# Patient Record
Sex: Male | Born: 1935 | Race: White | Hispanic: No
Health system: Midwestern US, Academic
[De-identification: ages and names within clinical notes are randomized; demographics above are authoritative.]

---

## 2017-05-05 MED ORDER — ASPIRIN 81 MG PO TBEC
81 mg | ORAL_TABLET | Freq: Every day | ORAL | 3 refills | Status: AC
Start: 2017-05-05 — End: ?

## 2017-05-10 MED ORDER — SODIUM CHLORIDE 0.9 % IV SOLP
INTRAVENOUS | 0 refills | Status: DC
Start: 2017-05-10 — End: 2017-05-10

## 2017-05-10 MED ORDER — ASPIRIN 325 MG PO TAB
325 mg | Freq: Once | ORAL | 0 refills | Status: DC
Start: 2017-05-10 — End: 2017-05-10

## 2017-05-10 MED ORDER — ALUMINUM-MAGNESIUM HYDROXIDE 200-200 MG/5 ML PO SUSP
30 mL | ORAL | 0 refills | Status: DC | PRN
Start: 2017-05-10 — End: 2017-05-10

## 2017-05-10 MED ORDER — TEMAZEPAM 15 MG PO CAP
15 mg | Freq: Every evening | ORAL | 0 refills | Status: DC | PRN
Start: 2017-05-10 — End: 2017-05-10

## 2017-05-10 MED ORDER — NITROGLYCERIN 0.4 MG SL SUBL
.4 mg | SUBLINGUAL | 0 refills | Status: DC | PRN
Start: 2017-05-10 — End: 2017-05-10

## 2017-05-10 MED ORDER — DIPHENHYDRAMINE HCL 25 MG PO CAP
25 mg | ORAL | 0 refills | Status: DC | PRN
Start: 2017-05-10 — End: 2017-05-10

## 2017-05-10 MED ORDER — ACETAMINOPHEN 325 MG PO TAB
650 mg | ORAL | 0 refills | Status: DC | PRN
Start: 2017-05-10 — End: 2017-05-10

## 2017-05-10 MED ORDER — DIPHENHYDRAMINE HCL 50 MG/ML IJ SOLN
25 mg | INTRAVENOUS | 0 refills | Status: DC | PRN
Start: 2017-05-10 — End: 2017-05-10

## 2017-05-10 MED ORDER — LIDOCAINE (PF) 10 MG/ML (1 %) IJ SOLN
.1-2 mL | INTRAMUSCULAR | 0 refills | Status: DC | PRN
Start: 2017-05-10 — End: 2017-05-10

## 2017-05-21 ENCOUNTER — Encounter: Admit: 2017-05-21 | Discharge: 2017-05-21 | Payer: MEDICARE

## 2017-05-21 DIAGNOSIS — I480 Paroxysmal atrial fibrillation: Principal | ICD-10-CM

## 2017-05-21 NOTE — Progress Notes
Pt confirmed dosing. Will check in 1-2 weeks.

## 2017-06-01 NOTE — Progress Notes
Gen Med Nurse Pre-visit Plan:          Patient hospitalized since last office visit: Yes.    POC testing or orders needed at office visit: No.    Health Maintenance Due   Topic Date Due    PERTUSSIS VACCINE  03/08/1945    TETANUS VACCINE  03/07/1953    SHINGLES VACCINE  03/07/1996        There are no diagnoses linked to this encounter.     Labs not done:  Lab Frequency Next Occurrence   REQUEST FOR CARDIOLOGY APPOINTMENT Once 08/29/2015   REQUEST FOR CARDIOLOGY APPOINTMENT Once 08/04/2016   DEVICE EVALUATION - PPM Once 11/04/2017   DEVICE EVALUATION - PPM Once 05/04/2017   HAND MIN 3 VIEWS RIGHT Once 12/09/2016   CBC AND DIFF Once 67/89/3810   BASIC METABOLIC PANEL Once 17/51/0258   LIPID PROFILE Once 02/09/2017   LIVER FUNCTION PANEL Once 02/09/2017   TSH WITH FREE T4 REFLEX Once 02/09/2017   KNEE 3 VIEWS LEFT Once 01/20/2017   2-D + DOPPLER ECHOCARDIOGRAM Once 05/05/2017   REQUEST FOR CARDIOLOGY APPOINTMENT Once 06/07/2017   PROTIME INR (PT) Once 05/17/2017   DEVICE EVALUATION - REMOTE PPM     PROTIME INR (PT)         RN called patient on 06/01/17 and LVM patient requesting fasting labs to be completed.     Notes to provider:

## 2017-06-03 ENCOUNTER — Encounter: Admit: 2017-06-03 | Discharge: 2017-06-03 | Payer: MEDICARE

## 2017-06-03 ENCOUNTER — Ambulatory Visit: Admit: 2017-06-03 | Discharge: 2017-06-04 | Payer: MEDICARE

## 2017-06-03 ENCOUNTER — Ambulatory Visit: Admit: 2017-06-03 | Discharge: 2017-06-03 | Payer: MEDICARE

## 2017-06-03 DIAGNOSIS — I2510 Atherosclerotic heart disease of native coronary artery without angina pectoris: ICD-10-CM

## 2017-06-03 DIAGNOSIS — B353 Tinea pedis: ICD-10-CM

## 2017-06-03 DIAGNOSIS — R001 Bradycardia, unspecified: ICD-10-CM

## 2017-06-03 DIAGNOSIS — I480 Paroxysmal atrial fibrillation: ICD-10-CM

## 2017-06-03 DIAGNOSIS — Z125 Encounter for screening for malignant neoplasm of prostate: ICD-10-CM

## 2017-06-03 DIAGNOSIS — H9190 Unspecified hearing loss, unspecified ear: ICD-10-CM

## 2017-06-03 DIAGNOSIS — I10 Essential (primary) hypertension: ICD-10-CM

## 2017-06-03 DIAGNOSIS — N401 Enlarged prostate with lower urinary tract symptoms: ICD-10-CM

## 2017-06-03 DIAGNOSIS — M1990 Unspecified osteoarthritis, unspecified site: ICD-10-CM

## 2017-06-03 DIAGNOSIS — Z823 Family history of stroke: ICD-10-CM

## 2017-06-03 DIAGNOSIS — B078 Other viral warts: ICD-10-CM

## 2017-06-03 DIAGNOSIS — I4891 Unspecified atrial fibrillation: ICD-10-CM

## 2017-06-03 DIAGNOSIS — Z7901 Long term (current) use of anticoagulants: ICD-10-CM

## 2017-06-03 DIAGNOSIS — L570 Actinic keratosis: ICD-10-CM

## 2017-06-03 DIAGNOSIS — I495 Sick sinus syndrome: ICD-10-CM

## 2017-06-03 DIAGNOSIS — L821 Other seborrheic keratosis: ICD-10-CM

## 2017-06-03 DIAGNOSIS — K219 Gastro-esophageal reflux disease without esophagitis: ICD-10-CM

## 2017-06-03 DIAGNOSIS — M25569 Pain in unspecified knee: ICD-10-CM

## 2017-06-03 DIAGNOSIS — J302 Other seasonal allergic rhinitis: ICD-10-CM

## 2017-06-03 DIAGNOSIS — D489 Neoplasm of uncertain behavior, unspecified: ICD-10-CM

## 2017-06-03 DIAGNOSIS — L304 Erythema intertrigo: ICD-10-CM

## 2017-06-03 DIAGNOSIS — E039 Hypothyroidism, unspecified: ICD-10-CM

## 2017-06-03 DIAGNOSIS — F5221 Male erectile disorder: ICD-10-CM

## 2017-06-03 DIAGNOSIS — R5383 Other fatigue: ICD-10-CM

## 2017-06-03 DIAGNOSIS — E782 Mixed hyperlipidemia: ICD-10-CM

## 2017-06-03 DIAGNOSIS — R42 Dizziness and giddiness: ICD-10-CM

## 2017-06-03 DIAGNOSIS — L578 Other skin changes due to chronic exposure to nonionizing radiation: ICD-10-CM

## 2017-06-03 DIAGNOSIS — Z950 Presence of cardiac pacemaker: ICD-10-CM

## 2017-06-03 DIAGNOSIS — E785 Hyperlipidemia, unspecified: ICD-10-CM

## 2017-06-03 DIAGNOSIS — B351 Tinea unguium: ICD-10-CM

## 2017-06-03 DIAGNOSIS — R51 Headache: ICD-10-CM

## 2017-06-03 LAB — LIVER FUNCTION PANEL
Lab: 0.3 mg/dL — ABNORMAL HIGH (ref <0.4)
Lab: 1.3 mg/dL — ABNORMAL HIGH (ref >40)
Lab: 21 U/L (ref 7–40)
Lab: 26 U/L — ABNORMAL LOW (ref >60)
Lab: 47 U/L (ref 25–110)
Lab: 7 g/dL (ref >60)

## 2017-06-03 LAB — PROSTATIC SPECIFIC ANTIGEN-PSA: Lab: 3.8 ng/mL (ref <6.01)

## 2017-06-03 LAB — CBC AND DIFF
Lab: 0 % (ref 0–2)
Lab: 0 10*3/uL (ref 0–0.20)
Lab: 0.2 10*3/uL (ref 0–0.45)
Lab: 0.7 10*3/uL (ref 0–0.80)
Lab: 1.3 10*3/uL (ref 1.0–4.8)
Lab: 3 % (ref 0–5)
Lab: 4.8 M/UL — ABNORMAL HIGH (ref 4.4–5.5)
Lab: 5.5 10*3/uL (ref 1.8–7.0)
Lab: 7.8 10*3/uL (ref 4.5–11.0)

## 2017-06-03 LAB — PROTIME INR (PT): Lab: 1.8 mg/dL — ABNORMAL HIGH (ref 0.8–1.2)

## 2017-06-03 LAB — BASIC METABOLIC PANEL
Lab: 137 MMOL/L (ref 137–147)
Lab: 4.3 MMOL/L (ref 3.5–5.1)

## 2017-06-03 LAB — TSH WITH FREE T4 REFLEX: Lab: 1.2 uU/mL (ref 0.35–5.00)

## 2017-06-03 LAB — LIPID PROFILE
Lab: 139 mg/dL — ABNORMAL HIGH (ref <200)
Lab: 94 mg/dL (ref <150)

## 2017-06-03 MED ORDER — TAMSULOSIN 0.4 MG PO CP24
.4 mg | ORAL_CAPSULE | Freq: Every day | ORAL | 3 refills | 90.00000 days | Status: AC
Start: 2017-06-03 — End: 2018-01-04

## 2017-06-03 MED FILL — SILDENAFIL 100 MG PO TAB: 100 mg | ORAL | 10 days supply | Qty: 10 | Fill #6 | Status: CP

## 2017-06-03 NOTE — Progress Notes
Subjective:             Jonathan Humphrey is a 83 y.o. male.    Hypertension   This is a chronic problem. The problem is controlled. Pertinent negatives include no blurred vision, chest pain, headaches, neck pain, orthopnea, palpitations or shortness of breath.     Jonathan Humphrey is 83 y.o. patient who presents to clinic for follow up. He was last seen 11/2016    He has hypertension and hyperlipidemia and he takes his medications daily. He follows with cardiology for atrial fibrillation and he is on coumadin. He is doing well. No chest pain    He has no chest pain or shortness of breath. No smoking. He drinks alcohol daily.     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     He saw cardiology 08/13/16 and had a normal stress test 08/14/16.     He had labs drawn today 06/03/17    He complains of frequent urination.     He saw ortho 01/2017 for trigger finger    He saw cardiology 05/05/17 and he was scheduled for cardiac cath which he did. Patient was taken to the cardiac catheterization lab on 05/10/17 where coronary angiography revealed mild coronary plaquing.     Past Medical History:   Diagnosis Date   ??? AK (actinic keratosis)     scalp   ??? Arthritis    ??? Atrial fibrillation (HCC) 01/16/2009   ??? Chronic anticoagulation 01/16/2009   ??? Coronary atherosclerosis 09/01/2006    Coronary artery disease   ??? Cyst     right upper back   ??? Dizziness 01/19/2007   ??? 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 Family history of cerebrovascular accident (CVA) 01/19/2007   ??? Fatigue 07/21/2007   ??? Generalized headaches    ??? GERD (gastroesophageal reflux disease) 12/30/2006   ??? Hearing loss 01/19/2007   ??? Hyperlipidemia 09/01/2006   ??? Hypertension, essential 09/01/2006   ??? Hypothyroidism 11/30/2007   ??? Intertrigo    ??? Knee pain 12/30/2006   ??? Neoplasm of uncertain behavior    ??? Onychomycosis    ??? Photoaging of skin    ??? Seasonal allergic reaction ??? Sinus bradycardia    ??? SK (seborrheic keratosis)     face, scalp, right ear   ??? SSS (sick sinus syndrome) (HCC)    ??? Tinea pedis    ??? Verruca vulgaris      Past Surgical History:   Procedure Laterality Date   ??? LEFT HEART CATHETERIZATION  05/1994    no stents   ??? CORONARY ANGIOPLASTY  05/1994    [facility], no stents   ??? SKIN BIOPSY  09/24/2006    shave biopsy   ??? CARDIOVERSION  03/11/2009   ??? 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 KNEE REPLACEMENT Right 10/09/14   ??? PR RPR UMBILICAL HRNA 5 YRS/> REDUCIBLE N/A 01/13/2016    REPAIR HERNIA UMBILICAL performed by Simonne Martinet, MD at [facility] MAIN OR/PERIOP   ??? PR RPR 1ST INGUN HRNA AGE 89 YRS/> REDUCIBLE Bilateral 01/13/2016    REPAIR HERNIA INGUINAL performed by Simonne Martinet, MD at [facility] MAIN OR/PERIOP     family history includes Cancer in his father; High Cholesterol in his mother; Hypertension in his mother; Stroke in his father.    Social History     Social History   ???  Marital status: Married     Spouse name: N/A   ??? Number of children: N/A   ??? Years of education: N/A     Occupational History   ??? Not on file.     Social History Main Topics   ??? Smoking status: Never Smoker   ??? Smokeless tobacco: Never Used   ??? Alcohol use Yes      Comment: 2-3 ounces daily   ??? Drug use: No   ??? Sexual activity: Not on file     Other Topics Concern   ??? Not on file     Social History Narrative   ??? No narrative on file       Social History   Substance Use Topics   ??? Smoking status: Never Smoker   ??? Smokeless tobacco: Never Used   ??? Alcohol use Yes      Comment: 2-3 ounces daily        Review of Systems   Constitutional: Negative.  Negative for appetite change, chills, diaphoresis, fatigue, fever and unexpected weight change.   HENT: Negative.  Negative for sneezing.    Eyes: Negative.  Negative for blurred vision, pain and itching.   Respiratory: Negative.  Negative for cough and shortness of breath.    Cardiovascular: Negative.  Negative for chest pain, palpitations and orthopnea.   Gastrointestinal: Negative.  Negative for abdominal distention, abdominal pain, blood in stool, constipation, diarrhea, nausea and vomiting.   Genitourinary: Negative.  Negative for flank pain, frequency and hematuria.   Musculoskeletal: Negative.  Negative for back pain, gait problem, neck pain and neck stiffness.   Skin: Negative.  Negative for pallor and rash.   Neurological: Negative.  Negative for seizures, facial asymmetry, numbness and headaches.   Psychiatric/Behavioral: Negative.  Negative for confusion.   All other systems reviewed and are negative.    Objective:         ??? aspirin EC 81 mg tablet Take 1 tablet by mouth daily. Take with food.   ??? atorvastatin (LIPITOR) 40 mg tablet Take 1 Tab by mouth at bedtime daily.   ??? cholecalciferol (VITAMIN D-3) 1,000 units tablet Take 1,500 Units by mouth daily.   ??? enalapril (VASOTEC) 20 mg tablet Take 1 Tab by mouth twice daily.   ??? ezetimibe (ZETIA) 10 mg tablet Take 1 Tab by mouth daily.   ??? ketoconazole (NIZORAL) 2 % topical cream Apply  to affected area twice daily. to scaly areas on forehead and nose   ??? levothyroxine (SYNTHROID) 50 mcg tablet Take 1 Tab by mouth daily.   ??? OMEGA-3 FATTY ACIDS/FISH OIL (FISH OIL-OMEGA-3 FATTY ACIDS PO) Take  by mouth daily. 2 tsp daily     ??? omeprazole DR(+) (PRILOSEC) 20 mg capsule Take 1 Cap by mouth daily.   ??? sildenafil(+) (VIAGRA) 100 mg tablet Take 1 Tab by mouth as Needed for Erectile dysfunction.   ??? tamsulosin (FLOMAX) 0.4 mg capsule Take 1 capsule by mouth daily. Do not crush, chew or open capsules. Take 30 minutes following the same meal each day.   ??? warfarin (COUMADIN) 4 mg tablet Take one daily or as instructed by Dr. Vivianne Spence (Patient taking differently: Take 4 mg by mouth as directed. 2 mg on MF and 4 mg rest of the week.)     Vitals:    06/03/17 1037   BP: (P) 117/71   Pulse: (P) 82   Resp: (P) 16   Temp: (P) 36.6 ???C (97.9 ???F)   TempSrc: (P)  Oral   Weight: (P) 93.8 kg (206 lb 12.8 oz) Height: (P) 172.7 cm (67.99)     Body mass index is 31.45 kg/m??? (pended).     Physical Exam   Constitutional: He is oriented to person, place, and time. He appears well-developed and well-nourished. No distress.   HENT:   Head: Normocephalic and atraumatic.   Mouth/Throat: No oropharyngeal exudate.   Eyes: Conjunctivae and EOM are normal. Pupils are equal, round, and reactive to light. Right eye exhibits no discharge. Left eye exhibits no discharge.   Neck: Normal range of motion. Neck supple. No JVD present. No tracheal deviation present.   Cardiovascular: Normal rate, regular rhythm and normal heart sounds.  Exam reveals no friction rub.    No murmur heard.  Pulmonary/Chest: Effort normal and breath sounds normal. No respiratory distress. He has no rales.       Abdominal: Soft. There is no tenderness. There is no rebound and no guarding.   Musculoskeletal: Normal range of motion.   Neurological: He is alert and oriented to person, place, and time. No cranial nerve deficit.   Skin: Skin is warm and dry. No rash noted. He is not diaphoretic. No pallor.   Psychiatric: He has a normal mood and affect. His behavior is normal. Judgment and thought content normal.   Nursing note and vitals reviewed.        Assessment and Plan:    He lives 120 [place] away from [place]:    BPH symptoms:  Will start Flomax  Will add PSA     He had bilateral inguinal and umbilical hernia repair 01/13/16: doing well. rewsolved  No complaints    Rt third finger with trigger finger:  He saw ortho clinic    CAD: s/p angioplasty in 1995. Follows with cardiology closely. Recent Thallium stress test 11/2011 was unremarkable. He had another stress test 10/15/15. 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 Continue risk factor modification. Continue Aspirin & Coumadin. Stable. Cardiology is adjusting his Coumadin dose.  He saw cardiology 08/13/16 and had a normal stress test 08/14/16. He saw cardiology 05/05/17 and he was scheduled for cardiac cath which he did. Patient was taken to the cardiac catheterization lab on 05/10/17 where coronary angiography revealed mild coronary plaquing.   Basic Metabolic Profile    Lab Results   Component Value Date/Time    NA 137 06/03/2017 07:51 AM    K 4.3 06/03/2017 07:51 AM    CA 9.8 06/03/2017 07:51 AM    CL 105 06/03/2017 07:51 AM    CO2 27 06/03/2017 07:51 AM    GAP 5 06/03/2017 07:51 AM    Lab Results   Component Value Date/Time    BUN 15 06/03/2017 07:51 AM    CR 0.89 06/03/2017 07:51 AM    GLU 101 (H) 06/03/2017 07:51 AM    GLU 94 01/06/2006 11:31 AM        Atrial fibrillation: intermittent. He has a history of angioplasty going back to 1995. He has had atrial arrhythmias. He has been in atrial fibrillation in the past. He failed a cardioversion back in 2010, but then spontaneously converted in 2011. He has underlying first-degree AV block and I think has underlying sinus node dysfunction. 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 The valve was left intact. He did not need any bypass surgery. He did  have some moderate nonobstructive disease. Stable. 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 I reviewed labs  He saw cardiology 08/13/16 and had a normal stress test 08/14/16.   He saw cardiology 05/05/17 and he was scheduled for cardiac cath which he did. Patient was taken to the cardiac catheterization lab on 05/10/17 where coronary angiography revealed mild coronary plaquing.   Cardiology adjusts his INR and Coumadin    Hypertension:controlled, continue meds, stable  Hypertension Management:  Medication compliance:  compliant most of the time,   Treatment goal: Systolic blood pressure 140 or <, diastolic BP 90 or <.  Outside blood pressures being performed - No  BP Readings from Last 3 Encounters:   06/03/17 (P) 117/71   05/10/17 125/75 05/05/17 128/72     He denies significant light-headedness.  Imp: Hypertension controlled   Plan:   Discussed hypertension and reviewed goals.  Are barriers to achieving goals present? No  Patient ready to comply? Yes  Educational resources identified? Yes - Handouts     Hyperlipidemia: stable on Lipitor 40 and reassess. LDL is at goal at 76.    Hyperlipidemia Management  LDL goal < 70.   Diet compliance:   compliant all of the time,   Medication compliance:  compliant all of the time,   Side effects to medications? No  Lab Results   Component Value Date    CHOL 139 06/03/2017    TRIG 94 06/03/2017    HDL 53 06/03/2017    LDL 74 06/03/2017    VLDL 19 06/03/2017    NONHDLCHOL 86 06/03/2017    CHOLHDLC 4.0 12/25/2011   Imp: Hyperlipidemia at goal  Plan:  Discussed labs and reviewed goals for LDL, HDL, triglycerides.   Discussed exercise management and diet with emphasis on vegetables, fruit and lean meat.  Are barriers to achieving goals present? No  Patient ready to comply? Yes  Educational resources identified? Yes - Handouts     GERD:  Stable on PPI    Erectile dysfunction:  Stable on Viagra    Hypothyroidism:  Continue Thyroid medications. Stable.   TSH   Lab Results   Component Value Date/Time    TSH 1.280 06/03/2017 07:51 AM        Hearing deficit:  He is following with audiology. 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 Stable.     Skin lesions:  He saw Derm 04/2014 , 06/04/2015 at [facility]    Return to clinic in 4-6 weeks to reassess BPH and response to therapy Total time 40 minutes.  Estimated counseling time 25 minutes.  Counseled patient regarding  BPH, A. Fib, anticoagulation, HTN. I went over labs and care plan  I discussed advance care planning ( see separate note)      Orders Placed This Encounter   ??? PSA SCREEN today   ??? tamsulosin (FLOMAX) 0.4 mg capsule     Encounter Medications   Medications   ??? tamsulosin (FLOMAX) 0.4 mg capsule  Sig: Take 1 capsule by mouth daily. Do not crush, chew or open capsules. Take 30 minutes following the same meal each day.     Dispense:  90 capsule     Refill:  3     Future Appointments  Date Time Provider Department Center   06/09/2017 10:30 AM Bayard Beaver, MD [facility]   06/09/2017 2:00 PM [facility] [place]   06/09/2017 3:30 PM Genton, Guido Sander, MD [facility] [place]     There are no Patient Instructions on file for this visit.    Orders Placed This Encounter   ??? PSA SCREEN today   ??? tamsulosin (FLOMAX) 0.4 mg capsule

## 2017-06-03 NOTE — Progress Notes
I spoke to pt.

## 2017-06-09 ENCOUNTER — Encounter: Admit: 2017-06-09 | Discharge: 2017-06-09 | Payer: MEDICARE

## 2017-06-09 ENCOUNTER — Ambulatory Visit: Admit: 2017-06-09 | Discharge: 2017-06-09 | Payer: MEDICARE

## 2017-06-09 DIAGNOSIS — L304 Erythema intertrigo: ICD-10-CM

## 2017-06-09 DIAGNOSIS — Z7901 Long term (current) use of anticoagulants: ICD-10-CM

## 2017-06-09 DIAGNOSIS — I10 Essential (primary) hypertension: Secondary | ICD-10-CM

## 2017-06-09 DIAGNOSIS — E039 Hypothyroidism, unspecified: ICD-10-CM

## 2017-06-09 DIAGNOSIS — I2510 Atherosclerotic heart disease of native coronary artery without angina pectoris: Principal | ICD-10-CM

## 2017-06-09 DIAGNOSIS — L821 Other seborrheic keratosis: ICD-10-CM

## 2017-06-09 DIAGNOSIS — F5221 Male erectile disorder: ICD-10-CM

## 2017-06-09 DIAGNOSIS — I441 Atrioventricular block, second degree: ICD-10-CM

## 2017-06-09 DIAGNOSIS — I495 Sick sinus syndrome: ICD-10-CM

## 2017-06-09 DIAGNOSIS — H9190 Unspecified hearing loss, unspecified ear: ICD-10-CM

## 2017-06-09 DIAGNOSIS — B078 Other viral warts: ICD-10-CM

## 2017-06-09 DIAGNOSIS — L578 Other skin changes due to chronic exposure to nonionizing radiation: ICD-10-CM

## 2017-06-09 DIAGNOSIS — R51 Headache: ICD-10-CM

## 2017-06-09 DIAGNOSIS — R5383 Other fatigue: ICD-10-CM

## 2017-06-09 DIAGNOSIS — R001 Bradycardia, unspecified: ICD-10-CM

## 2017-06-09 DIAGNOSIS — B079 Viral wart, unspecified: ICD-10-CM

## 2017-06-09 DIAGNOSIS — M25569 Pain in unspecified knee: ICD-10-CM

## 2017-06-09 DIAGNOSIS — E782 Mixed hyperlipidemia: ICD-10-CM

## 2017-06-09 DIAGNOSIS — Z823 Family history of stroke: ICD-10-CM

## 2017-06-09 DIAGNOSIS — L570 Actinic keratosis: ICD-10-CM

## 2017-06-09 DIAGNOSIS — J302 Other seasonal allergic rhinitis: ICD-10-CM

## 2017-06-09 DIAGNOSIS — B351 Tinea unguium: ICD-10-CM

## 2017-06-09 DIAGNOSIS — D489 Neoplasm of uncertain behavior, unspecified: ICD-10-CM

## 2017-06-09 DIAGNOSIS — I480 Paroxysmal atrial fibrillation: ICD-10-CM

## 2017-06-09 DIAGNOSIS — E785 Hyperlipidemia, unspecified: ICD-10-CM

## 2017-06-09 DIAGNOSIS — K219 Gastro-esophageal reflux disease without esophagitis: ICD-10-CM

## 2017-06-09 DIAGNOSIS — I4891 Unspecified atrial fibrillation: ICD-10-CM

## 2017-06-09 DIAGNOSIS — D485 Neoplasm of uncertain behavior of skin: Principal | ICD-10-CM

## 2017-06-09 DIAGNOSIS — R42 Dizziness and giddiness: ICD-10-CM

## 2017-06-09 DIAGNOSIS — M1990 Unspecified osteoarthritis, unspecified site: ICD-10-CM

## 2017-06-09 DIAGNOSIS — D229 Melanocytic nevi, unspecified: ICD-10-CM

## 2017-06-09 DIAGNOSIS — B353 Tinea pedis: ICD-10-CM

## 2017-06-09 MED ORDER — LISINOPRIL 20 MG PO TAB
20 mg | ORAL_TABLET | Freq: Every day | ORAL | 3 refills | Status: AC
Start: 2017-06-09 — End: 2018-09-12

## 2017-06-09 NOTE — Progress Notes
In range no call placed.

## 2017-06-09 NOTE — Progress Notes
Date of Service: 06/09/2017    Jonathan Humphrey is a 83 y.o. male.       HPI    Jonathan Humphrey comes for followup.  We saw him about a month ago.  He is a very delightful 83 year old gentleman with a history of paroxysmal atrial fibrillation.  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  He has had AFib ablation.  He has had resection of his left atrial appendage.  He has had previous angioplasty in 1995.  He has had coronary artery calcification.  He has a history of chronotropic incompetence and has a permanent pacemaker that was put in a couple of years ago.  Additional problems have included hypertension, hyperlipidemia, hypothyroidism, and gastroesophageal reflux.  Recently, he has been having some brief nonsustained VT.  We were concerned with his symptoms of fatigue and tiredness that he may have developed progressive coronary disease, and he therefore underwent heart catheterization on May 21st which did not show any obstructive coronary disease.  He had a radial procedure done.  This was uncomplicated and healed without troubles.  He is going to have an echo done today.  I will keep you informed with the results.  He has been missing his evening dose of enalapril.  He has been trying to take enalapril 40 mg once a day.  We talked about that issue, and we are going to switch him to lisinopril to get to a once a day dose and continue the ACE inhibitor benefits.  He denies fever, chills, and sweats.  There has been no TIA, stroke, or claudication.  He remains on warfarin.  There has been no bleeding.    ([id number])               Vitals:    06/09/17 1550   BP: 150/90   Pulse: 76   SpO2: 96%   Weight: 97.1 kg (214 lb)   Height: 1.727 m (5' 8)     Body mass index is 32.54 kg/m???.     Past Medical History  Patient Active Problem List    Diagnosis Date Noted   ??? Benign prostatic hyperplasia with lower urinary tract symptoms 06/03/2017   ??? Right shoulder pain 04/05/2017 ??? Shoulder arthritis 04/05/2017   ??? Trigger middle finger of right hand 12/09/2016   ??? Paroxysmal VT (HCC) 08/13/2016   ??? Right inguinal hernia 12/06/2015   ??? Cardiac pacemaker in situ 10/18/2015     ??? 10/21/15 [employer] dual-chamber PPM implantation - Dr. Naoma Diener     ??? 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 BMI 31.0-31.9,adult 08/10/2015   ??? Epigastric pain 07/25/2015   ??? Primary osteoarthritis of left knee 06/04/2015   ??? Visit for preventive health examination 09/05/2012   ??? AV block, 2nd degree 07/14/2010   ??? Leg cramps, sleep related 04/10/2010   ??? Aortic valve mass 03/28/2010     05/14/2010: Pt. underwent resection of intracardiac mass from right coronary cusp of aortic valve and modified radiofrequency maze with bilateral pulmonary vein isolation and resection of left atrial appendage by Dr. Helen Hashimoto.     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  Aortic valve function was preserved.  ???     ??? ETOH abuse 05/09/2009   ??? Coronary artery disease, non-occlusive 04/11/2009   ??? Hyperlipemia 04/11/2009   ??? Hypertension 04/11/2009   ???  GERD (gastroesophageal reflux disease) 04/11/2009   ??? 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 Erectile dysfunction of non-organic origin 04/11/2009   ??? Hypothyroid 04/11/2009   ??? Atrial fibrillation (HCC) 04/11/2009   ??? Chronic anticoagulation 04/11/2009         Review of Systems   Constitution: Negative.   HENT: Negative.    Eyes: Negative.    Cardiovascular: Positive for irregular heartbeat.   Respiratory: Negative.    Endocrine: Negative.    Hematologic/Lymphatic: Negative.    Skin: Negative.    Musculoskeletal: Positive for arthritis and muscle cramps.   Gastrointestinal: Negative.    Genitourinary: Negative.    Neurological: Negative.    Psychiatric/Behavioral: Negative.    Allergic/Immunologic: Negative.    Review of systems is documented in the database.    ([id number]) Physical Exam  On examination, he is in no distress.  He is 5 feet 8.  Weight is 214.  BMI is 32.5.  Blood pressure 150/90.  Pulse is regular at 76 beats per minute.  Saturation is 96%.  Venous pressure is 6-8 cm.  There is no edema.  Lungs are clear.  There is no wheeze or rhonchi.  PMI is not felt.  Heart sounds are normal.  I do not hear any gallops, murmurs, or rubs.  There is no hepatomegaly.  There were no abdominal bruits.  Bowel sounds are normal.  There is no icterus.  There are no focal neurologic deficits.  Distal pulses are 2+ bilaterally.    ([id number])        Cardiovascular Studies  EKG was not repeated.    ([id number])        Problems Addressed Today  Encounter Diagnoses   Name Primary?   ??? Coronary artery disease, non-occlusive    ??? Mixed hyperlipidemia    ??? Essential hypertension    ??? Paroxysmal atrial fibrillation (HCC)    ??? Chronic anticoagulation        Assessment and Plan    Jonathan Humphrey looks good with his recent heart catheterization.  We will see what the echo shows.  We will keep you informed with those results.  I would like to switch his enalapril to lisinopril 20 mg per day.  He will monitor his blood pressure.  We could always increase that if necessary.  He is going to be following up with Dr. Enis Slipper in the near future.  He was recently started on Flomax.  We will continue to see him.  I would like to see him back in about 6 months' time.  If I can be of assistance sooner, please do not hesitate to let me know.    ([id number])               Current Medications (including today's revisions)  ??? aspirin EC 81 mg tablet Take 1 tablet by mouth daily. Take with food.   ??? atorvastatin (LIPITOR) 40 mg tablet Take 1 Tab by mouth at bedtime daily.   ??? cholecalciferol (VITAMIN D-3) 1,000 units tablet Take 1,500 Units by mouth daily.   ??? enalapril (VASOTEC) 20 mg tablet Take 1 Tab by mouth twice daily.   ??? ezetimibe (ZETIA) 10 mg tablet Take 1 Tab by mouth daily. ??? ketoconazole (NIZORAL) 2 % topical cream Apply  to affected area twice daily. to scaly areas on forehead and nose   ??? levothyroxine (SYNTHROID) 50 mcg tablet Take 1 Tab by mouth daily.   ??? OMEGA-3 FATTY ACIDS/FISH OIL (FISH OIL-OMEGA-3 FATTY ACIDS PO)  Take  by mouth daily. 2 tsp daily     ??? omeprazole DR(+) (PRILOSEC) 20 mg capsule Take 1 Cap by mouth daily.   ??? sildenafil(+) (VIAGRA) 100 mg tablet Take 1 Tab by mouth as Needed for Erectile dysfunction.   ??? tamsulosin (FLOMAX) 0.4 mg capsule Take 1 capsule by mouth daily. Do not crush, chew or open capsules. Take 30 minutes following the same meal each day.   ??? warfarin (COUMADIN) 4 mg tablet Take one daily or as instructed by Dr. Vivianne Spence (Patient taking differently: Take 4 mg by mouth as directed. 2 mg on MF and 4 mg rest of the week.)

## 2017-06-09 NOTE — Progress Notes
Date of Service: 06/09/2017    Subjective:             Jonathan Humphrey is a 83 y.o. male.    History of Present Illness  LV 06/04/15    1. Multiple melanocytic nevi  - Denies any significant changes in the size, shape or color of any of his nevi  - None of his nevi are itching, bleeding or burning  - There is no history of blistering sunburns.  ???  2. History of actinic keratoses  - Treated with LN2 in the past   ???  3. Seborrheic dermatitis   - Patient uses ketoconazole cream with good results - patient does not need any refills     4. Bump on chin  -present for years  -slowly growing  -only bleeds when picked   ???  FHx: No family hx of skin cancer  SHX: Retired; non smoker  PMHX: No hx of skin cancer  ???     Review of Systems   Constitutional: Negative for appetite change and unexpected weight change.   Gastrointestinal: Negative for diarrhea, nausea and vomiting.   Skin: Negative for color change, pallor, rash and wound.         Objective:         ??? aspirin EC 81 mg tablet Take 1 tablet by mouth daily. Take with food.   ??? atorvastatin (LIPITOR) 40 mg tablet Take 1 Tab by mouth at bedtime daily.   ??? cholecalciferol (VITAMIN D-3) 1,000 units tablet Take 1,500 Units by mouth daily.   ??? enalapril (VASOTEC) 20 mg tablet Take 1 Tab by mouth twice daily.   ??? ezetimibe (ZETIA) 10 mg tablet Take 1 Tab by mouth daily.   ??? ketoconazole (NIZORAL) 2 % topical cream Apply  to affected area twice daily. to scaly areas on forehead and nose   ??? levothyroxine (SYNTHROID) 50 mcg tablet Take 1 Tab by mouth daily.   ??? OMEGA-3 FATTY ACIDS/FISH OIL (FISH OIL-OMEGA-3 FATTY ACIDS PO) Take  by mouth daily. 2 tsp daily     ??? omeprazole DR(+) (PRILOSEC) 20 mg capsule Take 1 Cap by mouth daily.   ??? sildenafil(+) (VIAGRA) 100 mg tablet Take 1 Tab by mouth as Needed for Erectile dysfunction.   ??? tamsulosin (FLOMAX) 0.4 mg capsule Take 1 capsule by mouth daily. Do not crush, chew or open capsules. Take 30 minutes following the same meal each day. ??? warfarin (COUMADIN) 4 mg tablet Take one daily or as instructed by Dr. Vivianne Spence (Patient taking differently: Take 4 mg by mouth as directed. 2 mg on MF and 4 mg rest of the week.)     Vitals:    06/09/17 0947   Weight: 93.4 kg (206 lb)   Height: 172.7 cm (67.99)     Body mass index is 31.33 kg/m???.     Physical Exam  Areas Examined (all normal unless noted below):  Head/Face  Neck  Chest/breasts/axillae  Back  Abdomen  R upper ext  L upper ext    Patient declined FBSE today       Pertinent findings include:    Multiple brown and tan evenly pigmented macules are distributed over the head, neck,???trunk, arms and legs.??? All have symmetric similar dermascopic findings with primarily globular and reticular patterns.  3 erythematous scaly papules are noted on the scalp x3,   ???   2 verrucous papule(s) with symmetric pebbled dermascopic findings located on the R cheek x1, L post auricular  scalp x1    NUO A: skin colored pearly papule with telangiectasias on chin     NUO B: pink pearly papule with telangiectasias on R medial cheek     Assessment and Plan:  ???1. Actinic keratoses  - LN2 to 3 lesions today  - Reviewed and encouraged sunprotection  ???  2. Multiple melanocytic nevi  - Will continue to monitor clinically  - Encouraged him to continue to monitor his nevi at home and return to clinic for any new, changing or symptomatic lesions  - Reviewed and encouraged sunprotection  ???  3. Seborrheic dermatitis  - Continue ketoconazole cream to affected areas up to twice daily as needed     4. NUO A-B  - 2 shave bx today  - DDx: BCC vs IDN   - Photodocumentation done after obtaining consent   - Discussed risks of bleeding, scar, infection with biopsy  - Patient given verbal and written biopsy site care instructions  - Will contact patient with biopsy results    5. Verruca Vulgaris  - LN2 - freeze/thaw/freeze x 10 second cycle to 2 lesions    RTC 1 year   ???

## 2017-06-09 NOTE — Procedures
Liquid Nitrogen Procedure Note    Risk and benefits of the above procedure including pain, dyspigmentation, scar, infection, recurrence were discussed with the patient (or legal guardian) in detail, who afterwards decided to proceed with the procedure.    Verbal informed consent given  Diagnosis: AK, VV  Body site: AK: scalp x3,    , VV: R cheek x1, L post auricular scalp x1    Number of lesions: AK: 3, VV: 2  Cycle duration: 10 sec  Number or cycles: 2   Wound care instructions given: Yes  Complications:  None  Tolerated well:  Yes  Ambulated from room:  Yes  Duration of procedure: > 53min

## 2017-06-09 NOTE — Procedures
Shave biopsy procedure note    Risk and benefits of the above procedure including bleeding, pain, dyspigmentation, scar, infection, recurrence or nerve damage with loss of muscle function and/or skin sensation were discussed with the patient (or legal guardian) in detail, who afterwards decided to proceed with the procedure.    Diagnosis: NUO A-B  Body Site: A: chin, B: R medial cheek   Preparation: Alcohol   Anesthesia: 1% lidocaine with epinephrine  Instrument: Dermablade  Hemostasis: AlCl3  Closure: None  Wound dressing: Vaseline  Wound care instructions given: Verbal  Complications:  None  Tolerated well: Yes  Ambulated from room:  Yes  Pathology sent to:  [facility]  Duration of procedure:  >5 minutes

## 2017-06-09 NOTE — Progress Notes
ATTESTATION    I personally performed the key portions of the E/M visit, discussed case with resident and concur with resident documentation of history, physical exam, assessment, and treatment plan unless otherwise noted. I performed cryotherapy to 3 AK, 2 VV today .Patient informed that a blistering reaction is to be expected and that a hypopigmented scar may result. Patient tolerated the procedure well with no complications. I personally performed the 2 shave biopsies today myself.        Staff name:  Elsie Stain, MD Date:  06/09/2017

## 2017-06-11 NOTE — Progress Notes
Kamar Callender MRN [id number]  Chin and Right Medial Cheek  Picture  [employer]      All documentation sent to Dr. Wendee Beavers office for scheduling.     Thanks!  Katrina

## 2017-06-11 NOTE — Progress Notes
Results disclosed to patient. Plan for Mohs of site discussed and patient was agreeable.     Becky and Katrina,  Could you please help us schedule this patient for Mohs with Dr. Hocker?  Thanks,  Trista Ciocca

## 2017-06-13 ENCOUNTER — Encounter: Admit: 2017-06-13 | Discharge: 2017-06-13 | Payer: MEDICARE

## 2017-06-15 ENCOUNTER — Encounter: Admit: 2017-06-15 | Discharge: 2017-06-15 | Payer: MEDICARE

## 2017-06-15 DIAGNOSIS — I480 Paroxysmal atrial fibrillation: Principal | ICD-10-CM

## 2017-06-22 ENCOUNTER — Encounter: Admit: 2017-06-22 | Discharge: 2017-06-22 | Payer: MEDICARE

## 2017-06-22 DIAGNOSIS — I480 Paroxysmal atrial fibrillation: Principal | ICD-10-CM

## 2017-06-22 NOTE — Progress Notes
WNL/MM

## 2017-07-05 ENCOUNTER — Encounter: Admit: 2017-07-05 | Discharge: 2017-07-05 | Payer: MEDICARE

## 2017-07-05 DIAGNOSIS — I480 Paroxysmal atrial fibrillation: Principal | ICD-10-CM

## 2017-07-05 DIAGNOSIS — E782 Mixed hyperlipidemia: ICD-10-CM

## 2017-07-05 DIAGNOSIS — I2510 Atherosclerotic heart disease of native coronary artery without angina pectoris: Principal | ICD-10-CM

## 2017-07-05 DIAGNOSIS — I4891 Unspecified atrial fibrillation: ICD-10-CM

## 2017-07-05 DIAGNOSIS — E039 Hypothyroidism, unspecified: ICD-10-CM

## 2017-07-05 MED ORDER — ATORVASTATIN 40 MG PO TAB
40 mg | ORAL_TABLET | Freq: Every evening | ORAL | 1 refills | Status: AC
Start: 2017-07-05 — End: 2018-01-04

## 2017-07-05 NOTE — Telephone Encounter
Pharmacy requesting refill of atorvastatin 40mg  tablets. LOV: 07/13/17 This is a standing order and patient meets standing order requirements. Refill sent to patient's preferred pharmacy. Martin Majestic, RN, BSN      Hepatic Function    Lab Results   Component Value Date/Time    ALBUMIN 4.5 06/03/2017 07:51 AM    TOTPROT 7.0 06/03/2017 07:51 AM    ALKPHOS 47 06/03/2017 07:51 AM    Lab Results   Component Value Date/Time    AST 21 06/03/2017 07:51 AM    ALT 26 06/03/2017 07:51 AM    TOTBILI 1.3 (H) 06/03/2017 07:51 AM

## 2017-07-07 NOTE — Progress Notes
Gen Med Nurse Pre-visit Plan:          Patient hospitalized since last office visit: No.    POC testing or orders needed at office visit: No.    Health Maintenance Due   Topic Date Due    PERTUSSIS VACCINE  03/08/1945    TETANUS VACCINE  03/07/1953    SHINGLES RECOMBINANT VACCINE (1 of 2) 03/07/1986        There are no diagnoses linked to this encounter.     Labs not done:  Lab Frequency Next Occurrence   REQUEST FOR CARDIOLOGY APPOINTMENT Once 08/29/2015   REQUEST FOR CARDIOLOGY APPOINTMENT Once 08/04/2016   DEVICE EVALUATION - PPM Once 11/04/2017   DEVICE EVALUATION - PPM Once 05/04/2017   HAND MIN 3 VIEWS RIGHT Once 12/09/2016   KNEE 3 VIEWS LEFT Once 01/20/2017   PSA SCREEN Once 06/03/2017   PATHOLOGY SURGICAL < 5 SPECIMENS Once 06/09/2017   REQUEST FOR CARDIOLOGY APPOINTMENT Once 12/09/2017   DEVICE EVALUATION - REMOTE PPM     PROTIME INR (PT)     PROTIME INR (PT)  07/09/2017, 07/16/2017, 07/23/2017, 07/30/2017, 08/06/2017, 08/13/2017, 08/20/2017, 08/27/2017, 09/03/2017, 09/10/2017           Notes to provider:

## 2017-07-08 ENCOUNTER — Encounter: Admit: 2017-07-08 | Discharge: 2017-07-08 | Payer: MEDICARE

## 2017-07-08 DIAGNOSIS — I480 Paroxysmal atrial fibrillation: Principal | ICD-10-CM

## 2017-07-08 NOTE — Progress Notes
Pt called and he had some skin cancer removed from his face on Wed the 18th and he was told to hold his warfarin for two days prior. Pt took a decrease dose on Monday too.We were not aware of any of this and I asked pt to let us know whenever he is asked to hold his warfarin for Dr Zachary George recommendations. He will resume his dose today and check it on Monday.

## 2017-07-12 ENCOUNTER — Encounter: Admit: 2017-07-12 | Discharge: 2017-07-12 | Payer: MEDICARE

## 2017-07-13 ENCOUNTER — Ambulatory Visit: Admit: 2017-07-13 | Discharge: 2017-07-13 | Payer: MEDICARE

## 2017-07-13 ENCOUNTER — Encounter: Admit: 2017-07-13 | Discharge: 2017-07-13 | Payer: MEDICARE

## 2017-07-13 DIAGNOSIS — L821 Other seborrheic keratosis: ICD-10-CM

## 2017-07-13 DIAGNOSIS — I2510 Atherosclerotic heart disease of native coronary artery without angina pectoris: Principal | ICD-10-CM

## 2017-07-13 DIAGNOSIS — Z7901 Long term (current) use of anticoagulants: ICD-10-CM

## 2017-07-13 DIAGNOSIS — M1990 Unspecified osteoarthritis, unspecified site: ICD-10-CM

## 2017-07-13 DIAGNOSIS — B078 Other viral warts: ICD-10-CM

## 2017-07-13 DIAGNOSIS — I480 Paroxysmal atrial fibrillation: ICD-10-CM

## 2017-07-13 DIAGNOSIS — E785 Hyperlipidemia, unspecified: ICD-10-CM

## 2017-07-13 DIAGNOSIS — L570 Actinic keratosis: ICD-10-CM

## 2017-07-13 DIAGNOSIS — R001 Bradycardia, unspecified: ICD-10-CM

## 2017-07-13 DIAGNOSIS — R51 Headache: ICD-10-CM

## 2017-07-13 DIAGNOSIS — E039 Hypothyroidism, unspecified: ICD-10-CM

## 2017-07-13 DIAGNOSIS — B351 Tinea unguium: ICD-10-CM

## 2017-07-13 DIAGNOSIS — Z823 Family history of stroke: ICD-10-CM

## 2017-07-13 DIAGNOSIS — I10 Essential (primary) hypertension: ICD-10-CM

## 2017-07-13 DIAGNOSIS — R5383 Other fatigue: ICD-10-CM

## 2017-07-13 DIAGNOSIS — L578 Other skin changes due to chronic exposure to nonionizing radiation: ICD-10-CM

## 2017-07-13 DIAGNOSIS — I495 Sick sinus syndrome: ICD-10-CM

## 2017-07-13 DIAGNOSIS — R42 Dizziness and giddiness: ICD-10-CM

## 2017-07-13 DIAGNOSIS — I4891 Unspecified atrial fibrillation: ICD-10-CM

## 2017-07-13 DIAGNOSIS — Z950 Presence of cardiac pacemaker: ICD-10-CM

## 2017-07-13 DIAGNOSIS — F5221 Male erectile disorder: ICD-10-CM

## 2017-07-13 DIAGNOSIS — B353 Tinea pedis: ICD-10-CM

## 2017-07-13 DIAGNOSIS — K219 Gastro-esophageal reflux disease without esophagitis: ICD-10-CM

## 2017-07-13 DIAGNOSIS — H9190 Unspecified hearing loss, unspecified ear: ICD-10-CM

## 2017-07-13 DIAGNOSIS — N401 Enlarged prostate with lower urinary tract symptoms: ICD-10-CM

## 2017-07-13 DIAGNOSIS — M25569 Pain in unspecified knee: ICD-10-CM

## 2017-07-13 DIAGNOSIS — D489 Neoplasm of uncertain behavior, unspecified: ICD-10-CM

## 2017-07-13 DIAGNOSIS — L304 Erythema intertrigo: ICD-10-CM

## 2017-07-13 DIAGNOSIS — E782 Mixed hyperlipidemia: ICD-10-CM

## 2017-07-13 DIAGNOSIS — J302 Other seasonal allergic rhinitis: ICD-10-CM

## 2017-07-15 ENCOUNTER — Encounter: Admit: 2017-07-15 | Discharge: 2017-07-15 | Payer: MEDICARE

## 2017-07-15 DIAGNOSIS — E039 Hypothyroidism, unspecified: ICD-10-CM

## 2017-07-15 DIAGNOSIS — I4891 Unspecified atrial fibrillation: ICD-10-CM

## 2017-07-15 DIAGNOSIS — I2510 Atherosclerotic heart disease of native coronary artery without angina pectoris: Principal | ICD-10-CM

## 2017-07-15 DIAGNOSIS — E782 Mixed hyperlipidemia: ICD-10-CM

## 2017-07-15 MED ORDER — EZETIMIBE 10 MG PO TAB
ORAL_TABLET | Freq: Every day | 1 refills | Status: AC
Start: 2017-07-15 — End: 2018-01-27

## 2017-07-15 NOTE — Telephone Encounter
Pharmacy requesting refill of ezetimibe (ZETIA) 10 mg tablet. LOV: 07/13/17 This is a standing order and patient meets standing order requirements. Refill sent to patient's preferred pharmacy. Martin Majestic, RN, BSN

## 2017-07-21 ENCOUNTER — Encounter: Admit: 2017-07-21 | Discharge: 2017-07-21 | Payer: MEDICARE

## 2017-07-21 DIAGNOSIS — I480 Paroxysmal atrial fibrillation: Principal | ICD-10-CM

## 2017-07-21 NOTE — Progress Notes
Called and spoke with pt. He has been holding for surgery tomorrow. He cannot take anticoag as he is having cancer removed from his face. He bled a lot last time. He will restart tomorrow hs and draw again at the beginning of next week.

## 2017-07-28 ENCOUNTER — Encounter: Admit: 2017-07-28 | Discharge: 2017-07-28 | Payer: MEDICARE

## 2017-07-28 DIAGNOSIS — I4891 Unspecified atrial fibrillation: Principal | ICD-10-CM

## 2017-07-28 NOTE — Progress Notes
In range, no call placed.

## 2017-08-05 ENCOUNTER — Ambulatory Visit: Admit: 2017-08-05 | Discharge: 2017-08-05 | Payer: MEDICARE

## 2017-08-05 ENCOUNTER — Encounter: Admit: 2017-08-05 | Discharge: 2017-08-05 | Payer: MEDICARE

## 2017-08-05 DIAGNOSIS — I495 Sick sinus syndrome: ICD-10-CM

## 2017-08-05 DIAGNOSIS — I441 Atrioventricular block, second degree: Principal | ICD-10-CM

## 2017-08-05 DIAGNOSIS — Z950 Presence of cardiac pacemaker: ICD-10-CM

## 2017-08-06 ENCOUNTER — Encounter: Admit: 2017-08-06 | Discharge: 2017-08-06 | Payer: MEDICARE

## 2017-08-09 ENCOUNTER — Encounter: Admit: 2017-08-09 | Discharge: 2017-08-09 | Payer: MEDICARE

## 2017-08-09 DIAGNOSIS — I4891 Unspecified atrial fibrillation: Principal | ICD-10-CM

## 2017-08-09 NOTE — Progress Notes
WL for pt.

## 2017-08-10 ENCOUNTER — Encounter: Admit: 2017-08-10 | Discharge: 2017-08-10 | Payer: MEDICARE

## 2017-08-10 ENCOUNTER — Ambulatory Visit: Admit: 2017-08-10 | Discharge: 2017-08-10 | Payer: MEDICARE

## 2017-08-10 ENCOUNTER — Ambulatory Visit: Admit: 2017-08-10 | Discharge: 2017-08-11 | Payer: MEDICARE

## 2017-08-10 DIAGNOSIS — L304 Erythema intertrigo: ICD-10-CM

## 2017-08-10 DIAGNOSIS — E039 Hypothyroidism, unspecified: ICD-10-CM

## 2017-08-10 DIAGNOSIS — E785 Hyperlipidemia, unspecified: ICD-10-CM

## 2017-08-10 DIAGNOSIS — I10 Essential (primary) hypertension: ICD-10-CM

## 2017-08-10 DIAGNOSIS — B353 Tinea pedis: ICD-10-CM

## 2017-08-10 DIAGNOSIS — R51 Headache: ICD-10-CM

## 2017-08-10 DIAGNOSIS — L578 Other skin changes due to chronic exposure to nonionizing radiation: ICD-10-CM

## 2017-08-10 DIAGNOSIS — R5383 Other fatigue: ICD-10-CM

## 2017-08-10 DIAGNOSIS — M1990 Unspecified osteoarthritis, unspecified site: ICD-10-CM

## 2017-08-10 DIAGNOSIS — F5221 Male erectile disorder: ICD-10-CM

## 2017-08-10 DIAGNOSIS — L821 Other seborrheic keratosis: ICD-10-CM

## 2017-08-10 DIAGNOSIS — D489 Neoplasm of uncertain behavior, unspecified: ICD-10-CM

## 2017-08-10 DIAGNOSIS — R001 Bradycardia, unspecified: ICD-10-CM

## 2017-08-10 DIAGNOSIS — Z0000 Encounter for general adult medical examination without abnormal findings: ICD-10-CM

## 2017-08-10 DIAGNOSIS — M25569 Pain in unspecified knee: ICD-10-CM

## 2017-08-10 DIAGNOSIS — K219 Gastro-esophageal reflux disease without esophagitis: ICD-10-CM

## 2017-08-10 DIAGNOSIS — I4891 Unspecified atrial fibrillation: ICD-10-CM

## 2017-08-10 DIAGNOSIS — B078 Other viral warts: ICD-10-CM

## 2017-08-10 DIAGNOSIS — Z823 Family history of stroke: ICD-10-CM

## 2017-08-10 DIAGNOSIS — I495 Sick sinus syndrome: ICD-10-CM

## 2017-08-10 DIAGNOSIS — H9190 Unspecified hearing loss, unspecified ear: ICD-10-CM

## 2017-08-10 DIAGNOSIS — L570 Actinic keratosis: ICD-10-CM

## 2017-08-10 DIAGNOSIS — I441 Atrioventricular block, second degree: Principal | ICD-10-CM

## 2017-08-10 DIAGNOSIS — Z6831 Body mass index (BMI) 31.0-31.9, adult: Principal | ICD-10-CM

## 2017-08-10 DIAGNOSIS — R634 Abnormal weight loss: ICD-10-CM

## 2017-08-10 DIAGNOSIS — J302 Other seasonal allergic rhinitis: ICD-10-CM

## 2017-08-10 DIAGNOSIS — Z7901 Long term (current) use of anticoagulants: ICD-10-CM

## 2017-08-10 DIAGNOSIS — R42 Dizziness and giddiness: ICD-10-CM

## 2017-08-10 DIAGNOSIS — B351 Tinea unguium: ICD-10-CM

## 2017-08-10 NOTE — Progress Notes
Annual Wellness Visit Providing Personalized Prevention Plan Services   Jonathan Humphrey is 83 y.o. patient who is presenting for annual wellness visit today.    Health Risk Assessment was conducted during the wellness visit and assessed the following functional and safety areas below:  1- Living arrangements - the patient With family:   Wife   2- Hearing impairment: Yes   3- Any limitations in functional ability to perform activities of daily living (ADLs): none    4- Independance level with activities of daily living: is generally independent in ADLs  5- Ability to successfully perform instrumental activities of daily living: Care giving , Community mobility  Driving, Armed forces training and education officer, Development worker, international aid, Communication mangement  speech, handwriting, email and phone, Meal preparation, Shopping, Home management  6- Behavioral and psychological risks: Patient was asked if he feels depressed:  None  7- Patient's self-assessment of overall health: well  8- falls assessment:  How often did the patient fall in the last month? None  9- Ability to successfully perform activities of daily living: no assistance needed     1. Medical and family history:   Past Medical History:   Diagnosis Date   ??? AK (actinic keratosis)     scalp   ??? Arthritis    ??? Atrial fibrillation (HCC) 01/16/2009   ??? Chronic anticoagulation 01/16/2009   ??? Coronary atherosclerosis 09/01/2006    Coronary artery disease   ??? Cyst     right upper back   ??? Dizziness 01/19/2007   ??? 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 Family history of cerebrovascular accident (CVA) 01/19/2007   ??? Fatigue 07/21/2007   ??? Generalized headaches    ??? GERD (gastroesophageal reflux disease) 12/30/2006   ??? Hearing loss 01/19/2007   ??? Hyperlipidemia 09/01/2006   ??? Hypertension, essential 09/01/2006   ??? Hypothyroidism 11/30/2007   ??? Intertrigo    ??? Knee pain 12/30/2006   ??? Neoplasm of uncertain behavior    ??? Onychomycosis    ??? Photoaging of skin ??? Seasonal allergic reaction    ??? Sinus bradycardia    ??? SK (seborrheic keratosis)     face, scalp, right ear   ??? SSS (sick sinus syndrome) (HCC)    ??? Tinea pedis    ??? Verruca vulgaris      Past Surgical History:   Procedure Laterality Date   ??? LEFT HEART CATHETERIZATION  05/1994    no stents   ??? [facility]   ??? SKIN BIOPSY  09/24/2006    shave biopsy   ??? CARDIOVERSION  03/11/2009   ??? 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 KNEE REPLACEMENT Right 10/09/14   ??? PR RPR UMBILICAL HRNA 5 YRS/> REDUCIBLE N/A 01/13/2016    REPAIR HERNIA UMBILICAL performed by Simonne Martinet, MD at [facility] MAIN OR/PERIOP   ??? PR RPR 1ST INGUN HRNA AGE 67 YRS/> REDUCIBLE Bilateral 01/13/2016    REPAIR HERNIA INGUINAL performed by Simonne Martinet, MD at [facility] MAIN OR/PERIOP     indicated that his mother is deceased. He indicated that his father is deceased.     No Known Allergies  Social History     Social History   ??? Marital status: Married     Spouse name: N/A   ??? Number of children: N/A   ??? Years of education: N/A     Occupational History   ??? Not  on file.     Social History Main Topics   ??? Smoking status: Never Smoker   ??? Smokeless tobacco: Never Used   ??? Alcohol use Yes      Comment: 2-3 ounces daily   ??? Drug use: No   ??? Sexual activity: Not on file     Other Topics Concern   ??? Not on file     Social History Narrative   ??? No narrative on file     Current Outpatient Prescriptions on File Prior to Visit   Medication Sig Dispense Refill   ??? aspirin EC 81 mg tablet Take 1 tablet by mouth daily. Take with food. 90 tablet 3   ??? atorvastatin (LIPITOR) 40 mg tablet Take 1 tab by mouth at bedtime daily. 90 tablet 1   ??? cholecalciferol (VITAMIN D-3) 1,000 units tablet Take 1,500 Units by mouth daily.     ??? ezetimibe (ZETIA) 10 mg tablet Take one tablet by mouth daily 90 tablet 1   ??? ketoconazole (NIZORAL) 2 % topical cream Apply  to affected area twice daily. to scaly areas on forehead and nose 60 g 3 ??? levothyroxine (SYNTHROID) 50 mcg tablet Take 1 Tab by mouth daily. 90 Tab 3   ??? lisinopril (PRINIVIL; ZESTRIL) 20 mg tablet Take 1 tablet by mouth daily. 90 tablet 3   ??? OMEGA-3 FATTY ACIDS/FISH OIL (FISH OIL-OMEGA-3 FATTY ACIDS PO) Take  by mouth daily. 2 tsp daily       ??? omeprazole DR(+) (PRILOSEC) 20 mg capsule Take 1 Cap by mouth daily. 90 Cap 3   ??? tamsulosin (FLOMAX) 0.4 mg capsule Take 1 capsule by mouth daily. Do not crush, chew or open capsules. Take 30 minutes following the same meal each day. 90 capsule 3   ??? warfarin (COUMADIN) 4 mg tablet Take one daily or as instructed by Dr. Vivianne Spence (Patient taking differently: Take 4 mg by mouth as directed. 2 mg on MF and 4 mg rest of the week.) 90 tablet 3     No current facility-administered medications on file prior to visit.        Recent Hospital admissions within last 6 months: Yes   05/10/2017: CAD history with tiredness and easy fatigability. NSVT on device interrogation. Admitted for elective cardiac cath to rule out development of progressive disease    2. List of current providers and suppliers:   The list of current providers that are regularly involved in providing medical care to this patient is:   Cardiologist- Dr. Vivianne Spence  Dermatologist- Dr. Evie Lacks  Orthopedics- Dr. Caryn Section  Sports Medicine- Dr. Hartley Barefoot  Opthalmology Dr. Catalina Antigua, office visit in December to follow up on retinal detachment    3. Height, weight, BMI,BP  Vitals:    08/10/17 0921   BP: 125/81   Pulse: 84   Resp: 19   Temp: 36.4 ???C (97.5 ???F)   TempSrc: Oral   SpO2: 98%   Weight: 92.8 kg (204 lb 9.6 oz)   Height: 172.7 cm (67.99)     Estimated body mass index is 31.12 kg/m??? as calculated from the following:    Height as of this encounter: 172.7 cm (67.99).    Weight as of this encounter: 92.8 kg (204 lb 9.6 oz).  Discussed patient's BMI with him.  The BMI is above average; drinks Atkins in the morning, referral to nutritionist placed to discuss better food alternatives and cardiac diet. 10 years ago weight started going up and having trouble losing. Pt desires  to lose about 20 lbs (182 lbs).    4. Detection of any cognitive impairment   Evaluation of cognitive function:  Mood: within normal limits  affect /Appearance: well developed, well nourished, in no acute distress  Family member/caregiver with patient during wellness visit today: None  Family member/caregiver input (if any): n/a    Three Item Recall: Ask the patient to repeat and remember three words: apple, tree, watch. Have the patient repeat the words immediately after you tell them the words. Then administer the 'Draw a Clock Test'. When that test is completed, ask the patient to tell you the three words you asked him/her to remember.  # Items Patient Recalled (0-3): 3   (Scoring Key: 0 positive screen for dementia, 1-2 positive screen for dementia if accompanied by other cognitive impairment ; 3 normal).    5. Screening for depression:  (Review of a patient's potential risk factors for depression, including current or past experiences with depression or other mood disorders, based on the use of an appropriate screening instrument for persons without a current diagnosis of depression, which the provider may select from various available standardized screening tests designed for this purpose and recognized by national professional medical organization. Clinic willuse PHQ 2 as a screening tool. If there are any yes answers; it is recommended to conduct a more comprehensive depression screening using PHQ 9)  Depression screening  PHQ2:  1. Over the past two weeks, have you felt down, depressed or hopeless?  No  2.  Over the past two weeks, have you felt little interest or pleasure in doing things? No    6. Assessments of hearing, self-care abilities, fall risk, home safety:   Get Up and Go Test Time:  8 seconds  ???Pt sits in chair, stands up without using the arms and walks 10 feet. Time in seconds, watch for wobble.  <11 sec nl, >20sec abnl.Marland KitchenMarland Kitchen???    Functional ability/Safety screening   1. Was the patient???s timed Up & Go test unsteady or longer than 30 seconds? No  2. Do you need help with the phone, transportation, shopping, preparing meals, housework, laundry, medications or managing money? No  3. Does your home have rugs in the hallway, lack grab bars in the bathroom, lack handrails on the stairs or have poor lighting? No  4. Have you noticed any hearing difficulties? Yes, hearing aids bilateral and wears them when needed   5. Hearing evaluation referral is indicated at this time: No    Lung Cancer Screening assessment eligibility    Age  Age between 64-109 years old Patient's age: 83 y.o.  No   Asymptomatic Patient has no symptoms of lung cancer or a previous diagnosis of lung cancer?  Yes    Smoking history amount Tobacco smoking history of at least 30 pack-years (one pack-year = smoking one pack per day for one year; 1 pack = 20 cigarettes); No   Smoking status Current smoker or one who has quit smoking within the last 15 years; No   Screening Decision: If all answers are YES, then discuss and order Referral for Lung Cancer Screening.    I am referring ZAKHAR WILINSKI for a lung cancer screening.  Rennis Golden will be screened with CMS-mandated eligibility requirements.   If he meets the criteria, he will receive a shared decision-making counseling visit including education and decision aids for low dose CT (LDCT) as outlined by CMS, smoking cessation counseling, and will be appropriately consented.  The patient will be managed through the Lung Cancer Screening Program for all follow-ups and annual lung cancer LDCT screening.  Referral to Lung Cancer Screening Program:  No       7. 5-10 year preventive service plan was discussed with the patient as below : a copy was given to the patient with explanation of  preventive services in the next 5-10 years: Preventive Service Frequency  Last Done    Body Mass Index (BMI)_Estimated body mass index is 31.12 kg/m??? as calculated from the following:    Height as of this encounter: 172.7 cm (67.99).    Weight as of this encounter: 92.8 kg (204 lb 9.6 oz).___  Height _______    Weight ______    Annually BMI today:   31.12- above average    Referral to Nutrition placed   Blood Pressure _______/_______     ? Every 2 yrs, if BP </= 120/80 mm hg;  ? Annually, if BP >120-139/80-89 mm hg BP today:  125/81   Vision   ? Every 3 yrs up to age 38;  ? Every 2 yrs aged 40+ Upcoming visit in December   Cholesterol Testing Regularly beginning at age 35 with risk factors Last 06/03/2017  Repeat 09/13/2017   Diabetes Screening With a sustained BP >/= 135/80 mm Hg Last 06/03/2017  Repeat 09/13/2017 (double check)   Colorectal Cancer Screening ? Annually, Fecal Occult Blood Stool (FOBS);  ? Every 5 yrs, Sigmoidoscopy with FOBS;  Every 10 yrs, Colonoscopy (start age 6 and stop at 52 ) Not required    Sexually Transmitted Diseases (STD???s) ? As necessary for those with risk factors N/A   Hepatitis C screening for people born between [birth year] and 1963 Once in lifetime Not required   Depression Screening As necessary for those with risk factors Not needed    Alcohol Misuse Screening As necessary for those with risk factors Drink daily- 2-3 oz of [place]    Immunizations:     Pneumococcal (Pneumonia) Vaccine     Influenza (Flu) Vaccine ? Pneumonia: 1-2 doses up to age 5;  ? Pneumonia: 1 dose age 76+  Influenza: Annually Done:  Prevnar 08/07/15  Pneumovax 10/12/13     MALES: Abdominal Aortic Aneurysm ? Once, between the age range of 77-75 and smoked 100+ cigarettes in lifetime Followed by Cardiology- completed   FEMALES: Breast Cancer Screening (Mammogram) Every 1-2 yrs, aged 19-74 yrs N/A   FEMALES: Cervical Cancer Screening (Pap Smear) ? Every 3 yrs, aged 43-64 yrs;  Every 5 yrs, aged 41-65 with HPV testing N/A FEMALES: Osteoporosis Screening (Bone Density Measurement) ? Routinely, for women aged 65+ every 2 years  ? Routinely, for women aged 60-64 with risk factors N/A   Your major risk factors:   Family history of high cholesterol, high blood pressure, stroke and cancer   Obesity - BMI 31.12   Diabetes-  weight   Hypertension- managed with lisinopril  Fall Risk- low fall risk  Smoking Use: n/a    Other: Atrial Fibrillation and skin cancers  Recommendations for improvement:   Diet- nutrition referral placed    Tobacco Cessation- n/a   Weight Management- n/a   Exercise- walk dog a mile (M-F), shorter walks now due to dog  Other- lose weight, goal is 182lbs (20 lb loss)     8. List of risk factors and interventions:   Patient was given specific education handout as below related to fire safety, driving safety, suicide risk, osteoporosis prevention, and falls  prevention. Patient was able to ask questions about those risks and what the patient can do to avoid or prevent.   ===========================================================  Patient Education Handout  Fire Safety  The Facts:  ??? Cooking is the primary cause of home fires.  ??? Smoking is the leading cause of fire-related deaths.  ??? 80% of [place] fire deaths occur in home fires.  ??? 50% of home fire deaths occur in homes without smoke alarms.  ??? Most home fires occur during winter months.  ??? Alcohol contributes to an estimated 40% of home related fire deaths.  Who is at greatest risk?  ??? Children under the age of 70.  ??? Adults 65 and older.  ??? African Americans and Native Americans.  ??? Persons living in rural areas.  ??? Persons living in manufactured homes or substandard housing.  What can you do?  ??? Be sure your home is equipped with a functioning Smoke and Carbon Monoxide alarm.  ??? Do not smoke.  ??? Do not drink to excess.  ??? Monitor your stove, oven, and kitchen appliances.  ??? Have a functioning fire extinguisher in your kitchen.    Driving Safety  The Facts: ??? Motor vehicle-related deaths and injuries among older adults are rising.  ??? Drivers 65 and older have higher crash death rates per mile than all but teen drivers.  ??? The 37 and older population is the fastest growing segment of the population.  ??? Older drivers who are injured in a motor vehicle accident are more likely than younger drivers to die of their injuries.  ??? Rates for motor vehicle-related injury are twice as high for older men than for older women.  What can you do?  ??? Wear you seatbelt in the car ??? all the time.  ??? Be sure that your vision and hearing have been tested and are satisfactory.  ??? Do not drink and drive.  ??? Talk with family about your driving skills and consider their advice when assessing your driving skills and safety.  ??? Do not talk on a cell phone and drive at the same time.    Suicide Risk  The Facts:  ??? Suicide rates increase with age and rates are high among those over 54.  ??? Older adults who are suicidal are also more likely to be suffering from  ??? Physical illnesses and to be divorced or widowed.  ??? Older men are more likely to commit suicide than older women.  ??? Firearms are used in the majority of suicides committed by older adults.  What can you do?  ??? Seek care if you are depressed.  ??? Seek social supports such as family, church, [employer], and friends if you are ill, divorced, or living alone.  Remember that depression is a medical illness and not a personal failing or weakness.  There are effective treatments for depression and your medical providers are interested in helping you if you are depressed.    Osteoporosis Prevention  The Facts:  ??? Osteoporosis is more common among older adults.  ??? Women have a higher rate of osteoporosis than men.  ??? The presence of osteoporosis increases the risk of injury from a fall.  ??? Smoking is a risk factor for the development of osteoporosis.  What can you do?  ??? Participate in a regular exercise program such as walking ??? Be sure that you are taking 1,000 to 1,500 mg of calcium per day, preferably with vitamin D  ??? Do not smoke.  ???  Do not drink alcohol to excess.  ??? Consider having a bone density test to look for osteoporosis.    Falls Prevention  The Facts:  ??? More than one-third of adults 62 and older fall each year.  ??? Among older Americans, falls are the leading cause of injury deaths and the most common cause of non-fatal injury and hospital admission for trauma.  ??? Of those older adults who fall, 20% to 30% suffer moderate to severe injuries such as hip fractures or head trauma that reduce mobility and independence, and increase the risk of premature death.  ??? Falls are the leading cause of traumatic brain injuries.  ??? Among older adults, the majority of fractures are caused by falls.  ??? White men have the highest fall-related death rates, followed by white women, black men, and then black women.  ??? Women sustain about 80% of all hip fractures.  ??? Of all fall-related fractures, hip fractures cause the greatest number of deaths and lead to the most severe health problems and reduced quality of life.  ??? Up to 25% of community-dwelling older adults who sustain a hip fracture remain institutionalized for at least one year.  Who is at risk?  ??? As noted above, adults over the age of 55 have an increased risk, but that risk is even higher among those older than 57.  ??? Those with lower body weakness.  ??? Those with problems walking and balance.  ??? Those who are on four or more medications or any psychoactive medications.  ??? Those who drink excessively.  What can you do?  ??? Increase lower body strength and balance through regular physical activity and exercise.  ??? Review all of your medications with your provider regularly to see if any can be eliminated or the dose reduced.  ??? Have your vision checked regularly.  ??? Remove tripping hazards in your home such as clutter in the hallways and on the stairs.  ??? Remove throw rugs. ??? Use non-slip mats in the bathtub and on shower floors.  ??? Have grab bars next to the toilet and in the tub or shower.  ??? Have handrails on both sides of the stairway  ??? Be sure that the lighting is adequate throughout your home.  ??? Do not drink alcohol to excess.  ??? If you require the assistance of a cane or walker, use it all the time.  ============================================================    9. Health advice and referrals:   It was discussed with patient  preventive counseling services or programs aimed at reducing identified risk factors and improving self-management, or community-based lifestyle interventions to reduce health risks and promote self-management and wellness, including weight management, physical activity, smoking cessation, fall prevention, and nutrition.  Referrals: Recommended to complete Shingles vaccine and Tdap booster through pharmacy. Provided information in AVS. Referral to Nutrition placed for BMI 31.12 and weight loss.     10. VOLUNTARY: Advance directive :   It was discussed with the patient the importance of advance directive and how the patient can prepare an advance directive in the case where an injury or illness causes the individual to be unable to make health care decisions. The patient was also given the resource and the referral option to our clinic social worker to provide support and guidance on how to get an advance directive. Completed Advance Care Planning/Resuscitation Status Conversation during office visit on 12/09/2016.    Staff conducting initial intake:     Physician:   I reviewed and  approved orders and components above of annual wellness visit and the personalized prevention plan services for this patient.

## 2017-08-13 ENCOUNTER — Encounter: Admit: 2017-08-13 | Discharge: 2017-08-13 | Payer: MEDICARE

## 2017-08-13 DIAGNOSIS — R5383 Other fatigue: ICD-10-CM

## 2017-08-13 DIAGNOSIS — R51 Headache: ICD-10-CM

## 2017-08-13 DIAGNOSIS — M25569 Pain in unspecified knee: ICD-10-CM

## 2017-08-13 DIAGNOSIS — E039 Hypothyroidism, unspecified: ICD-10-CM

## 2017-08-13 DIAGNOSIS — B353 Tinea pedis: ICD-10-CM

## 2017-08-13 DIAGNOSIS — R42 Dizziness and giddiness: ICD-10-CM

## 2017-08-13 DIAGNOSIS — L570 Actinic keratosis: ICD-10-CM

## 2017-08-13 DIAGNOSIS — M1990 Unspecified osteoarthritis, unspecified site: ICD-10-CM

## 2017-08-13 DIAGNOSIS — F5221 Male erectile disorder: ICD-10-CM

## 2017-08-13 DIAGNOSIS — L578 Other skin changes due to chronic exposure to nonionizing radiation: ICD-10-CM

## 2017-08-13 DIAGNOSIS — I10 Essential (primary) hypertension: ICD-10-CM

## 2017-08-13 DIAGNOSIS — D489 Neoplasm of uncertain behavior, unspecified: ICD-10-CM

## 2017-08-13 DIAGNOSIS — L304 Erythema intertrigo: ICD-10-CM

## 2017-08-13 DIAGNOSIS — L821 Other seborrheic keratosis: ICD-10-CM

## 2017-08-13 DIAGNOSIS — I495 Sick sinus syndrome: ICD-10-CM

## 2017-08-13 DIAGNOSIS — R001 Bradycardia, unspecified: ICD-10-CM

## 2017-08-13 DIAGNOSIS — Z7901 Long term (current) use of anticoagulants: ICD-10-CM

## 2017-08-13 DIAGNOSIS — Z823 Family history of stroke: ICD-10-CM

## 2017-08-13 DIAGNOSIS — I4891 Unspecified atrial fibrillation: ICD-10-CM

## 2017-08-13 DIAGNOSIS — B078 Other viral warts: ICD-10-CM

## 2017-08-13 DIAGNOSIS — B351 Tinea unguium: ICD-10-CM

## 2017-08-13 DIAGNOSIS — K219 Gastro-esophageal reflux disease without esophagitis: ICD-10-CM

## 2017-08-13 DIAGNOSIS — J302 Other seasonal allergic rhinitis: ICD-10-CM

## 2017-08-13 DIAGNOSIS — H9190 Unspecified hearing loss, unspecified ear: ICD-10-CM

## 2017-08-13 DIAGNOSIS — E785 Hyperlipidemia, unspecified: ICD-10-CM

## 2017-08-24 ENCOUNTER — Encounter: Admit: 2017-08-24 | Discharge: 2017-08-24 | Payer: MEDICARE

## 2017-08-24 DIAGNOSIS — I4891 Unspecified atrial fibrillation: Principal | ICD-10-CM

## 2017-08-24 NOTE — Progress Notes
WNL/MM

## 2017-09-06 ENCOUNTER — Encounter: Admit: 2017-09-06 | Discharge: 2017-09-06 | Payer: MEDICARE

## 2017-09-06 DIAGNOSIS — I2510 Atherosclerotic heart disease of native coronary artery without angina pectoris: Principal | ICD-10-CM

## 2017-09-06 DIAGNOSIS — E782 Mixed hyperlipidemia: ICD-10-CM

## 2017-09-06 DIAGNOSIS — I4891 Unspecified atrial fibrillation: ICD-10-CM

## 2017-09-06 DIAGNOSIS — E039 Hypothyroidism, unspecified: ICD-10-CM

## 2017-09-06 MED ORDER — OMEPRAZOLE 20 MG PO CPDR
20 mg | ORAL_CAPSULE | Freq: Every day | ORAL | 1 refills | Status: AC
Start: 2017-09-06 — End: 2018-01-27

## 2017-09-06 NOTE — Progress Notes
WL for pt.

## 2017-09-06 NOTE — Telephone Encounter
Pharmacy requesting refill of omeprazole DR(+) (PRILOSEC) 20 mg capsule. LOV: 08/10/17 This is a standing order and patient meets standing order requirements. Refill sent to patient's preferred pharmacy. Martin Majestic, RN, BSN

## 2017-09-08 ENCOUNTER — Encounter: Admit: 2017-09-08 | Discharge: 2017-09-08 | Payer: MEDICARE

## 2017-09-08 NOTE — Progress Notes
Clinical Nutrition Assessment Summary    Jonathan Humphrey is a 83 y.o. male with No diagnosis found.   Past Medical History: afib on chronic anticoagulation, arthritis, GERD, HLD, HTN    Current Treatment Plan: Heart healthy weight loss nutrition therapy    Nutrition Assessment of Patient:  Height: 172.7 cm (67.99) (per chart)  Weight: 92.4 kg (203 lb 9.6 oz)  BMI (Calculated): 30.96  BMI Categories Adult: Obesity Class I: 30-34.9  Estimated Calorie Needs: 1800 (BMR 1609 per [place] x 1.2AF = 1930)  Estimated Protein Needs: 90 (1 g/kg present WT 92kg, 20% kcal needs)  Needs to promote: weight loss    Wt Readings from Last 5 Encounters:   09/08/17 92.4 kg (203 lb 9.6 oz)   08/10/17 92.8 kg (204 lb 9.6 oz)   07/13/17 93.1 kg (205 lb 3.2 oz)   06/09/17 97.1 kg (214 lb)   06/09/17 97.1 kg (214 lb)      Malnutrition Assessment: Does not meet criteria    Pertinent Labs: Last A1c 5.2% in 2011    Wants to lose 20#, would like to be 180#, this was his typical adult wt, last at 180# around 9yrs ago. Unsure if wt came on suddenly or was gradual. Thinks walking, many other things would be easier if he lost wt. Issues with L leg and hip, needs to get this checked out. To lose wt has started to eat 2/3 less than what he ate 10-15 yrs ago. Slim B, slim evening meals, doesn't seem to help much.    Lives [place] [facility] [place] about [place] in [place], [place]. Says sometimes the one grocery store there doesn't have good produce options. Three kids- one in [place], one in [place], one in [place]. Often comes to [facility] area for appointments/social engagements and gets groceries here.    Has been using GF bread at home as wife's PCP rec'd eliminate gluten and sugar to decrease her pain. Pt reports needing to take care of himself so he can be around to care for his wife. In past he kept food log, felt this helped him follow other RD's rules.    Loves beans. Eats about 1 hot dog per week. Nutrition Diagnosis:  Overweight/Obesity      Etiology: Hx excessive kcal intake      Signs & Symptoms: BMI 30.96          Intervention/Plan:  ??? Discussed: plate method for portion control, healthy proteins and avoiding processed meats, increasing fruit and vegetable intake, low-K+ fruits and vegetables to encourage, replacing saturated and trans fats with unsaturated fats, 2gm Na limit, high Na foods to avoid, increasing fiber, protein at every meal, merits of GF products, slow weight loss achieved by above and portion control  ??? Provided Heart healthy nutrition handout    Nutrition Monitoring and Evaluation:  Goal: Weight loss to goal  Time Frame: ongoing    The patient was allowed to ask questions and actively participated in creating plan of care. I provided the patient with my contact information and instructed pt on how to get in touch with me if questions or concerns arise.     Follow up Date: PRN    Thank you for allowing nutrition services to participate in this patients care.    Start time: 2:30  End time: 3:10  Time spent in Consultation: 40 minutes  Reason for consult: Medical Nutrition Therapy  Referral: Eyad Al-Hihi    Cyndi Lennert, MS, RD, LD  *  [phone]

## 2017-09-09 ENCOUNTER — Encounter: Admit: 2017-09-09 | Discharge: 2017-09-09 | Payer: MEDICARE

## 2017-09-09 DIAGNOSIS — I4891 Unspecified atrial fibrillation: ICD-10-CM

## 2017-09-09 DIAGNOSIS — E782 Mixed hyperlipidemia: ICD-10-CM

## 2017-09-09 DIAGNOSIS — I2510 Atherosclerotic heart disease of native coronary artery without angina pectoris: Principal | ICD-10-CM

## 2017-09-09 DIAGNOSIS — E039 Hypothyroidism, unspecified: ICD-10-CM

## 2017-09-09 MED ORDER — LEVOTHYROXINE 50 MCG PO TAB
50 ug | ORAL_TABLET | Freq: Every day | ORAL | 1 refills | 30.00000 days | Status: AC
Start: 2017-09-09 — End: 2018-01-27

## 2017-09-09 NOTE — Telephone Encounter
Pharmacy requesting refill of levothyroxine (SYNTHROID) 50 mcg tablet. LOV: 08/10/17 This is a standing order and patient meets standing order requirements. Refill sent to patient's preferred pharmacy. Martin Majestic, RN, BSN          TSH   Lab Results   Component Value Date/Time    TSH 1.280 06/03/2017 07:51 AM

## 2017-09-20 ENCOUNTER — Encounter: Admit: 2017-09-20 | Discharge: 2017-09-20 | Payer: MEDICARE

## 2017-09-20 DIAGNOSIS — I4891 Unspecified atrial fibrillation: Principal | ICD-10-CM

## 2017-09-20 NOTE — Progress Notes
Patients INR in range so no call placed to him.

## 2017-09-28 ENCOUNTER — Encounter: Admit: 2017-09-28 | Discharge: 2017-09-28 | Payer: MEDICARE

## 2017-10-04 ENCOUNTER — Encounter: Admit: 2017-10-04 | Discharge: 2017-10-04 | Payer: MEDICARE

## 2017-10-04 NOTE — Progress Notes
I spoke to spouse.

## 2017-10-05 ENCOUNTER — Encounter: Admit: 2017-10-05 | Discharge: 2017-10-05 | Payer: MEDICARE

## 2017-10-05 NOTE — Telephone Encounter
Spoke to patient, see anticoag encounter

## 2017-10-05 NOTE — Progress Notes
Spoke to patient, he had increased to 4mg  daily on his own last week. Will resume previous dosing. Will consider putting pt on 3mg  on M/F if his INR is low, but will wait for result next Monday.

## 2017-10-05 NOTE — Telephone Encounter
Pt called and states that he got the instructions on INR from his wife, but he has had a med change that he needs to speak with [place] about as he thinks it will affect dosing. He will be home until 1100, and then again after 13-1400. Would like a call back.

## 2017-10-13 ENCOUNTER — Encounter: Admit: 2017-10-13 | Discharge: 2017-10-13 | Payer: MEDICARE

## 2017-10-13 DIAGNOSIS — I4891 Unspecified atrial fibrillation: Principal | ICD-10-CM

## 2017-10-13 NOTE — Progress Notes
Called and spoke with pt. He will dose and check as directed.

## 2017-10-22 ENCOUNTER — Encounter: Admit: 2017-10-22 | Discharge: 2017-10-22 | Payer: MEDICARE

## 2017-10-22 DIAGNOSIS — I4891 Unspecified atrial fibrillation: Principal | ICD-10-CM

## 2017-10-22 NOTE — Progress Notes
Pt/MM

## 2017-11-04 ENCOUNTER — Ambulatory Visit: Admit: 2017-11-04 | Discharge: 2017-11-05 | Payer: MEDICARE

## 2017-11-05 ENCOUNTER — Encounter: Admit: 2017-11-05 | Discharge: 2017-11-05 | Payer: MEDICARE

## 2017-11-05 DIAGNOSIS — Z950 Presence of cardiac pacemaker: ICD-10-CM

## 2017-11-05 DIAGNOSIS — I495 Sick sinus syndrome: ICD-10-CM

## 2017-11-05 DIAGNOSIS — I4891 Unspecified atrial fibrillation: Principal | ICD-10-CM

## 2017-11-05 DIAGNOSIS — I441 Atrioventricular block, second degree: Principal | ICD-10-CM

## 2017-11-05 NOTE — Progress Notes
WL for pt.

## 2017-11-22 ENCOUNTER — Encounter: Admit: 2017-11-22 | Discharge: 2017-11-22 | Payer: MEDICARE

## 2017-11-22 DIAGNOSIS — I10 Essential (primary) hypertension: ICD-10-CM

## 2017-11-22 DIAGNOSIS — E782 Mixed hyperlipidemia: ICD-10-CM

## 2017-11-22 DIAGNOSIS — I4891 Unspecified atrial fibrillation: ICD-10-CM

## 2017-11-22 DIAGNOSIS — I358 Other nonrheumatic aortic valve disorders: ICD-10-CM

## 2017-11-22 DIAGNOSIS — I2510 Atherosclerotic heart disease of native coronary artery without angina pectoris: Principal | ICD-10-CM

## 2017-11-22 DIAGNOSIS — I472 Ventricular tachycardia: ICD-10-CM

## 2017-11-22 LAB — CBC AND DIFF
Lab: 0.1
Lab: 0.4
Lab: 0.7
Lab: 0.8
Lab: 1.5 — ABNORMAL LOW (ref 1.5–3.5)
Lab: 19 — ABNORMAL LOW (ref 24–44)
Lab: 33 — ABNORMAL HIGH (ref 26–33)
Lab: 34
Lab: 4.8
Lab: 5
Lab: 5
Lab: 65
Lab: 7.6
Lab: 8.9
Lab: 9.7

## 2017-11-22 LAB — MAGNESIUM: Lab: 2.1

## 2017-11-22 LAB — TSH WITH FREE T4 REFLEX: Lab: 1.6 — ABNORMAL HIGH (ref 80–94)

## 2017-11-22 LAB — BNP (B-TYPE NATRIURETIC PEPTI): Lab: 321 (ref 5–450)

## 2017-11-22 NOTE — Telephone Encounter
Called and spoke with pt about recommendations below. He will get labs at [facility] orders to [phone] to hospital lab. Transferred pt to sched to make EP appt and echo appt.

## 2017-11-22 NOTE — Telephone Encounter
Called and spoke with pt. He was unaware of any episodes, and was non symptomatic. He said he was probably watching TV at the time listed below. Will review with REG.

## 2017-11-22 NOTE — Telephone Encounter
Henrene Hawking, MD sent to You 54 minutes ago (9:55 AM)     Moshe Salisbury- please get labs and echo and have him seen by the EP team. Thanks RG (Routing comment)       Ginger Organ, APRN sent to You 1 hour ago (9:16 AM)     Yes - let's get a CMP, Mg++, TSH with reflex T4, CBC and BNP. Thanks, Medical illustrator)

## 2017-11-22 NOTE — Telephone Encounter
-----   Message from Mercy Riding sent at 11/22/2017  7:49 AM CST -----  Regarding: Remote alert for VT  Remote alert Nov 21, 2017 for VT Episode occurred (V>A). One VT episode and one nonsustained V episodes both on 11/21/17 at 15:15.  Rates 169 to 181 bpm. Each episode showed a pt in and out of VT (multiple short bursts).     Thanks,    Alveta Heimlich

## 2017-11-22 NOTE — Progress Notes
I spoke to pt and he checked his inr again this am and it was 4.3. He will take 2mg s tonight and check it Thursday.

## 2017-11-23 ENCOUNTER — Encounter: Admit: 2017-11-23 | Discharge: 2017-11-23 | Payer: MEDICARE

## 2017-11-23 ENCOUNTER — Ambulatory Visit: Admit: 2017-11-23 | Discharge: 2017-11-24 | Payer: MEDICARE

## 2017-11-23 DIAGNOSIS — B351 Tinea unguium: ICD-10-CM

## 2017-11-23 DIAGNOSIS — M1990 Unspecified osteoarthritis, unspecified site: ICD-10-CM

## 2017-11-23 DIAGNOSIS — B353 Tinea pedis: ICD-10-CM

## 2017-11-23 DIAGNOSIS — L821 Other seborrheic keratosis: ICD-10-CM

## 2017-11-23 DIAGNOSIS — J302 Other seasonal allergic rhinitis: ICD-10-CM

## 2017-11-23 DIAGNOSIS — F5221 Male erectile disorder: ICD-10-CM

## 2017-11-23 DIAGNOSIS — R51 Headache: ICD-10-CM

## 2017-11-23 DIAGNOSIS — R001 Bradycardia, unspecified: ICD-10-CM

## 2017-11-23 DIAGNOSIS — E785 Hyperlipidemia, unspecified: ICD-10-CM

## 2017-11-23 DIAGNOSIS — R5383 Other fatigue: ICD-10-CM

## 2017-11-23 DIAGNOSIS — Z823 Family history of stroke: ICD-10-CM

## 2017-11-23 DIAGNOSIS — L578 Other skin changes due to chronic exposure to nonionizing radiation: ICD-10-CM

## 2017-11-23 DIAGNOSIS — I495 Sick sinus syndrome: ICD-10-CM

## 2017-11-23 DIAGNOSIS — I10 Essential (primary) hypertension: ICD-10-CM

## 2017-11-23 DIAGNOSIS — E039 Hypothyroidism, unspecified: ICD-10-CM

## 2017-11-23 DIAGNOSIS — M25569 Pain in unspecified knee: ICD-10-CM

## 2017-11-23 DIAGNOSIS — L570 Actinic keratosis: ICD-10-CM

## 2017-11-23 DIAGNOSIS — L304 Erythema intertrigo: ICD-10-CM

## 2017-11-23 DIAGNOSIS — K219 Gastro-esophageal reflux disease without esophagitis: ICD-10-CM

## 2017-11-23 DIAGNOSIS — I4891 Unspecified atrial fibrillation: ICD-10-CM

## 2017-11-23 DIAGNOSIS — D489 Neoplasm of uncertain behavior, unspecified: ICD-10-CM

## 2017-11-23 DIAGNOSIS — H9190 Unspecified hearing loss, unspecified ear: ICD-10-CM

## 2017-11-23 DIAGNOSIS — B078 Other viral warts: ICD-10-CM

## 2017-11-23 DIAGNOSIS — Z7901 Long term (current) use of anticoagulants: ICD-10-CM

## 2017-11-23 DIAGNOSIS — R42 Dizziness and giddiness: ICD-10-CM

## 2017-11-23 MED ORDER — METOPROLOL SUCCINATE 25 MG PO TB24
25 mg | ORAL_TABLET | Freq: Every day | ORAL | 3 refills | 90.00000 days | Status: AC
Start: 2017-11-23 — End: 2019-02-08

## 2017-11-24 DIAGNOSIS — I10 Essential (primary) hypertension: ICD-10-CM

## 2017-11-24 DIAGNOSIS — I480 Paroxysmal atrial fibrillation: ICD-10-CM

## 2017-11-24 DIAGNOSIS — I471 Supraventricular tachycardia: Principal | ICD-10-CM

## 2017-11-24 DIAGNOSIS — I4891 Unspecified atrial fibrillation: ICD-10-CM

## 2017-11-26 ENCOUNTER — Encounter: Admit: 2017-11-26 | Discharge: 2017-11-26 | Payer: MEDICARE

## 2017-11-26 DIAGNOSIS — I4891 Unspecified atrial fibrillation: Principal | ICD-10-CM

## 2017-11-26 NOTE — Progress Notes
Pt/MM

## 2017-12-03 ENCOUNTER — Encounter: Admit: 2017-12-03 | Discharge: 2017-12-03 | Payer: MEDICARE

## 2017-12-03 NOTE — Progress Notes
No call placed

## 2017-12-08 ENCOUNTER — Ambulatory Visit: Admit: 2017-12-08 | Discharge: 2017-12-09 | Payer: MEDICARE

## 2017-12-08 ENCOUNTER — Ambulatory Visit: Admit: 2017-12-08 | Discharge: 2017-12-08 | Payer: MEDICARE

## 2017-12-08 ENCOUNTER — Encounter: Admit: 2017-12-08 | Discharge: 2017-12-08 | Payer: MEDICARE

## 2017-12-08 DIAGNOSIS — E785 Hyperlipidemia, unspecified: ICD-10-CM

## 2017-12-08 DIAGNOSIS — R51 Headache: ICD-10-CM

## 2017-12-08 DIAGNOSIS — I4891 Unspecified atrial fibrillation: ICD-10-CM

## 2017-12-08 DIAGNOSIS — I2510 Atherosclerotic heart disease of native coronary artery without angina pectoris: Principal | ICD-10-CM

## 2017-12-08 DIAGNOSIS — I441 Atrioventricular block, second degree: ICD-10-CM

## 2017-12-08 DIAGNOSIS — Z950 Presence of cardiac pacemaker: ICD-10-CM

## 2017-12-08 DIAGNOSIS — I480 Paroxysmal atrial fibrillation: ICD-10-CM

## 2017-12-08 DIAGNOSIS — I358 Other nonrheumatic aortic valve disorders: ICD-10-CM

## 2017-12-08 DIAGNOSIS — M1990 Unspecified osteoarthritis, unspecified site: ICD-10-CM

## 2017-12-08 DIAGNOSIS — B351 Tinea unguium: ICD-10-CM

## 2017-12-08 DIAGNOSIS — K219 Gastro-esophageal reflux disease without esophagitis: ICD-10-CM

## 2017-12-08 DIAGNOSIS — I10 Essential (primary) hypertension: ICD-10-CM

## 2017-12-08 DIAGNOSIS — Z823 Family history of stroke: ICD-10-CM

## 2017-12-08 DIAGNOSIS — R001 Bradycardia, unspecified: ICD-10-CM

## 2017-12-08 DIAGNOSIS — E782 Mixed hyperlipidemia: ICD-10-CM

## 2017-12-08 DIAGNOSIS — E039 Hypothyroidism, unspecified: ICD-10-CM

## 2017-12-08 DIAGNOSIS — I359 Nonrheumatic aortic valve disorder, unspecified: ICD-10-CM

## 2017-12-08 DIAGNOSIS — L304 Erythema intertrigo: ICD-10-CM

## 2017-12-08 DIAGNOSIS — B078 Other viral warts: ICD-10-CM

## 2017-12-08 DIAGNOSIS — I495 Sick sinus syndrome: ICD-10-CM

## 2017-12-08 DIAGNOSIS — H9190 Unspecified hearing loss, unspecified ear: ICD-10-CM

## 2017-12-08 DIAGNOSIS — L570 Actinic keratosis: ICD-10-CM

## 2017-12-08 DIAGNOSIS — B353 Tinea pedis: ICD-10-CM

## 2017-12-08 DIAGNOSIS — I472 Ventricular tachycardia: Secondary | ICD-10-CM

## 2017-12-08 DIAGNOSIS — D489 Neoplasm of uncertain behavior, unspecified: ICD-10-CM

## 2017-12-08 DIAGNOSIS — M25569 Pain in unspecified knee: ICD-10-CM

## 2017-12-08 DIAGNOSIS — Z7901 Long term (current) use of anticoagulants: ICD-10-CM

## 2017-12-08 DIAGNOSIS — R42 Dizziness and giddiness: ICD-10-CM

## 2017-12-08 DIAGNOSIS — L821 Other seborrheic keratosis: ICD-10-CM

## 2017-12-08 DIAGNOSIS — F5221 Male erectile disorder: ICD-10-CM

## 2017-12-08 DIAGNOSIS — J302 Other seasonal allergic rhinitis: ICD-10-CM

## 2017-12-08 DIAGNOSIS — R5383 Other fatigue: ICD-10-CM

## 2017-12-08 DIAGNOSIS — L578 Other skin changes due to chronic exposure to nonionizing radiation: ICD-10-CM

## 2017-12-08 NOTE — Progress Notes
Date of Service: 12/08/2017    Jonathan Humphrey is a 83 y.o. male.       HPI    Jonathan Humphrey comes for followup.  I saw him 6 months ago.  He is a very delightful 83 year old gentleman.  He has a history of paroxysmal atrial fibrillation.  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  He has had an AFib ablation.  He had resection of his left atrial appendage.  He has had previous angioplasty in 1995.  He has coronary artery calcification.  He has chronotropic incompetence.  He has a permanent pacemaker.  He paces nearly 100% of the time.  He has had hypertension, hyperlipidemia, hypothyroidism, and gastroesophageal reflux.  He has had nonsustained VT.  He had a heart catheterization in May that showed only mild coronary atherosclerosis.  There was no obstructive coronary disease, and he did not need intervention.  He has remained on warfarin.  He denies TIA, stroke and claudication.  There has been no bleeding.  He saw Jonathan Humphrey recently for recurrent nonsustained VT.  He did not have symptoms.  This occurred at 3:15 on December 2nd.  He was started on metoprolol 25 mg per day.  He remains active.  There has been no chest pain or shortness of breath.  He is going to have an echocardiogram done today.  He denies presyncope, syncope, and peripheral edema.  His recent thyroid function studies were normal.  His electrolytes were normal.    ([id number])             Vitals:    12/08/17 1251   BP: 124/80   Pulse: 71   Weight: 93.9 kg (207 lb)   Height: 1.727 m (5' 8)     Body mass index is 31.47 kg/m???.     Past Medical History  Patient Active Problem List    Diagnosis Date Noted   ??? Benign prostatic hyperplasia with lower urinary tract symptoms 06/03/2017   ??? Right shoulder pain 04/05/2017   ??? Shoulder arthritis 04/05/2017   ??? Trigger middle finger of right hand 12/09/2016   ??? Paroxysmal VT (HCC) 08/13/2016   ??? Right inguinal hernia 12/06/2015 ??? Cardiac pacemaker in situ 10/18/2015     ??? 10/21/15 [employer] dual-chamber PPM implantation - Dr. Naoma Diener     ??? 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 BMI 31.0-31.9,adult 08/10/2015   ??? Epigastric pain 07/25/2015   ??? Primary osteoarthritis of left knee 06/04/2015   ??? Visit for preventive health examination 09/05/2012   ??? AV block, 2nd degree 07/14/2010   ??? Leg cramps, sleep related 04/10/2010   ??? Aortic valve mass 03/28/2010     05/14/2010: Pt. underwent resection of intracardiac mass from right coronary cusp of aortic valve and modified radiofrequency maze with bilateral pulmonary vein isolation and resection of left atrial appendage by Dr. Helen Hashimoto.     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  Aortic valve function was preserved.  ???     ??? ETOH abuse 05/09/2009   ??? Coronary artery disease, non-occlusive 04/11/2009   ??? Hyperlipemia 04/11/2009   ??? Hypertension 04/11/2009   ??? GERD (gastroesophageal reflux disease) 04/11/2009   ??? 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 Erectile dysfunction of non-organic origin 04/11/2009   ??? Hypothyroid 04/11/2009   ??? Atrial fibrillation (HCC) 04/11/2009   ??? Chronic anticoagulation  04/11/2009         Review of Systems   Constitution: Negative.   HENT: Negative.    Eyes: Negative.    Cardiovascular: Negative.    Respiratory: Negative.    Endocrine: Negative.    Hematologic/Lymphatic: Negative.    Skin: Negative.    Musculoskeletal: Positive for stiffness.   Gastrointestinal: Negative.    Genitourinary: Negative.    Neurological: Negative.    Psychiatric/Behavioral: Negative.    Allergic/Immunologic: Negative.    Review of systems is documented in the database.    ([id number])        Physical Exam  On examination, he is in no distress.  He is 5 feet 8.  Weight is 207.  Weight is down 7 pounds.  BMI is 31.5.  Blood pressure 124/80.  Pulse is regular at 70 beats per minute.  His rhythm is paced.  His venous pressure is normal.  There is no edema.  Lungs are clear.  There is no wheeze or rhonchi.  PMI is not felt.  Heart sounds are normal.  I do not hear any gallops, murmurs, or rubs.  There is no hepatomegaly.  There are no abdominal bruits.  Bowel sounds are normal.  There is no icterus.  There are no focal neurologic deficits.  Distal pulses are 2+ bilaterally.    ([id number])        Cardiovascular Studies  EKG shows sinus rhythm with ventricular pacing.  There has been no interval change.  His device check looks good.  He has not had any recurrent VT.  There have been no atrial arrhythmias.  He still has 11 years left on his battery.  He is ventricular pacing 99% of the time.    ([id number])        Problems Addressed Today  Encounter Diagnoses   Name Primary?   ??? NSVT (nonsustained ventricular tachycardia) (HCC) Yes   ??? Coronary artery disease, non-occlusive    ??? Mixed hyperlipidemia    ??? Essential hypertension    ??? Paroxysmal atrial fibrillation (HCC)    ??? Aortic valve mass    ??? 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 Paroxysmal VT (HCC)    ??? Cardiac pacemaker in situ        Assessment and Plan    [name] is stable with no symptoms of angina, heart failure, or arrhythmias.  He is going to have an echocardiogram.  I will keep you informed with the results.  I would like to get a cardiac MRI with gadolinium.  If that is possible from a pacemaker perspective, I want to be sure there is no underlying myocardial scar, infiltration, or inflammation.  I will keep you informed with the results.  We will continue to follow him closely.    ([id number])             Current Medications (including today's revisions)  ??? aspirin EC 81 mg tablet Take 1 tablet by mouth daily. Take with food.   ??? atorvastatin (LIPITOR) 40 mg tablet Take 1 tab by mouth at bedtime daily. ??? cholecalciferol (VITAMIN D-3) 1,000 units tablet Take 1,500 Units by mouth daily.   ??? ezetimibe (ZETIA) 10 mg tablet Take one tablet by mouth daily   ??? ketoconazole (NIZORAL) 2 % topical cream Apply  to affected area twice daily. to scaly areas on forehead and nose   ??? levothyroxine (SYNTHROID) 50 mcg tablet Take 1 tab by mouth daily.   ??? lisinopril (  PRINIVIL; ZESTRIL) 20 mg tablet Take 1 tablet by mouth daily.   ??? metoprolol XL (TOPROL XL) 25 mg extended release tablet Take one tablet by mouth daily.   ??? OMEGA-3 FATTY ACIDS/FISH OIL (FISH OIL-OMEGA-3 FATTY ACIDS PO) Take  by mouth daily. 2 tsp daily     ??? omeprazole DR(+) (PRILOSEC) 20 mg capsule Take 1 cap by mouth daily.   ??? tamsulosin (FLOMAX) 0.4 mg capsule Take 1 capsule by mouth daily. Do not crush, chew or open capsules. Take 30 minutes following the same meal each day.   ??? warfarin (COUMADIN) 4 mg tablet Take one daily or as instructed by Dr. Vivianne Spence (Patient taking differently: Take 4 mg by mouth as directed. 2 mg on MF and 4 mg rest of the week.)

## 2017-12-09 ENCOUNTER — Encounter: Admit: 2017-12-09 | Discharge: 2017-12-09 | Payer: MEDICARE

## 2017-12-10 ENCOUNTER — Encounter: Admit: 2017-12-10 | Discharge: 2017-12-10 | Payer: MEDICARE

## 2017-12-10 DIAGNOSIS — Z950 Presence of cardiac pacemaker: Principal | ICD-10-CM

## 2017-12-10 NOTE — Telephone Encounter
-----   Message from Henrene Hawking, MD sent at 12/09/2017  5:30 PM CST -----  Melanie-please let him know that the echo looks good with normal ejection fraction.  Please continue with the current treatment plan.  Thanks RG

## 2017-12-10 NOTE — Progress Notes
Pt needs cardiac MRI. 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 He follows here with Dr Trudee Kuster. He has not had a CXR within 2 years.     MRI on 01/06/17 At 10am.    CXR 9 am. Device check 915. LDB here to give orders.

## 2017-12-10 NOTE — Telephone Encounter
I called pt with his echo results. He is also trying to set up his cardiac MRI. I called MRI and they did not have the date he wanted. I transferred pt to scheduling so he could set up an appt that works for him.

## 2017-12-16 ENCOUNTER — Ambulatory Visit: Admit: 2017-12-16 | Discharge: 2017-12-16 | Payer: BC Managed Care – PPO

## 2017-12-16 ENCOUNTER — Encounter: Admit: 2017-12-16 | Discharge: 2017-12-16 | Payer: MEDICARE

## 2017-12-16 DIAGNOSIS — R51 Headache: ICD-10-CM

## 2017-12-16 DIAGNOSIS — H9190 Unspecified hearing loss, unspecified ear: ICD-10-CM

## 2017-12-16 DIAGNOSIS — J302 Other seasonal allergic rhinitis: ICD-10-CM

## 2017-12-16 DIAGNOSIS — M1990 Unspecified osteoarthritis, unspecified site: ICD-10-CM

## 2017-12-16 DIAGNOSIS — I10 Essential (primary) hypertension: ICD-10-CM

## 2017-12-16 DIAGNOSIS — D489 Neoplasm of uncertain behavior, unspecified: ICD-10-CM

## 2017-12-16 DIAGNOSIS — Z7901 Long term (current) use of anticoagulants: ICD-10-CM

## 2017-12-16 DIAGNOSIS — L821 Other seborrheic keratosis: ICD-10-CM

## 2017-12-16 DIAGNOSIS — I495 Sick sinus syndrome: ICD-10-CM

## 2017-12-16 DIAGNOSIS — R5383 Other fatigue: ICD-10-CM

## 2017-12-16 DIAGNOSIS — M25569 Pain in unspecified knee: ICD-10-CM

## 2017-12-16 DIAGNOSIS — L570 Actinic keratosis: ICD-10-CM

## 2017-12-16 DIAGNOSIS — L304 Erythema intertrigo: ICD-10-CM

## 2017-12-16 DIAGNOSIS — B078 Other viral warts: ICD-10-CM

## 2017-12-16 DIAGNOSIS — L578 Other skin changes due to chronic exposure to nonionizing radiation: ICD-10-CM

## 2017-12-16 DIAGNOSIS — F5221 Male erectile disorder: ICD-10-CM

## 2017-12-16 DIAGNOSIS — E785 Hyperlipidemia, unspecified: ICD-10-CM

## 2017-12-16 DIAGNOSIS — M19019 Primary osteoarthritis, unspecified shoulder: Principal | ICD-10-CM

## 2017-12-16 DIAGNOSIS — B353 Tinea pedis: ICD-10-CM

## 2017-12-16 DIAGNOSIS — R001 Bradycardia, unspecified: ICD-10-CM

## 2017-12-16 DIAGNOSIS — I4891 Unspecified atrial fibrillation: ICD-10-CM

## 2017-12-16 DIAGNOSIS — E039 Hypothyroidism, unspecified: ICD-10-CM

## 2017-12-16 DIAGNOSIS — K219 Gastro-esophageal reflux disease without esophagitis: ICD-10-CM

## 2017-12-16 DIAGNOSIS — R42 Dizziness and giddiness: ICD-10-CM

## 2017-12-16 DIAGNOSIS — Z823 Family history of stroke: ICD-10-CM

## 2017-12-16 DIAGNOSIS — B351 Tinea unguium: ICD-10-CM

## 2017-12-22 ENCOUNTER — Encounter: Admit: 2017-12-22 | Discharge: 2017-12-22 | Payer: MEDICARE

## 2017-12-22 DIAGNOSIS — I480 Paroxysmal atrial fibrillation: Principal | ICD-10-CM

## 2017-12-27 NOTE — Progress Notes
Subjective:     Jonathan Humphrey returns to clinic for further evaluation of right shoulder pain.  Since our last evaluation, he describes a snapping sensation at the shoulder.  Overall his pain has improved considerably and with it his range of motion.  He is concerned that the snapping sensation may be an indication that he is causing increasing damage to the shoulder.  It is not really all that painful for him.  No numbness or tingling.  He remains happy with his trigger finger.    Objective:  Ortho Exam  On examination of his right shoulder, active elevation to 145 degrees, external rotation 40 degrees, and internal rotation to T12.  No lag signs are present.  He is not able to produce a snapping sensation at this time.  He does have some weakness with resisted external rotation.  Good strength with belly press testing and elevation.  He fires his deltoid well.    IMAGING:  X-rays of the right shoulder from 04/05/2017 were reviewed.  There are mild glenohumeral arthritic changes noted with humeral neck osteophyte formation.  The joint spaces are well maintained.     Assessment and Plan:  #1  Right shoulder arthritis    I reviewed with Jonathan Humphrey that I suspect arthritic changes are causing the majority of his complaints, although certainly a small rotator cuff tear is possible.  It is difficult to pin down his nonpainful snapping sensations at the shoulder and we reviewed a number of potential causes.  I have reassured him that I do not think he is causing increased damage by using the shoulder with certain caveats.  I think he has a good understanding.  He was appreciative of our discussion.  All of his questions were answered.          Dictated by Daisy Blossom, MD and transcribed via [employer]. Copied and pasted into O2 by Cher Nakai, 12/27/2017 10:06 AM

## 2017-12-28 ENCOUNTER — Encounter: Admit: 2017-12-28 | Discharge: 2017-12-28 | Payer: MEDICARE

## 2017-12-30 ENCOUNTER — Ambulatory Visit: Admit: 2017-12-30 | Discharge: 2017-12-30 | Payer: MEDICARE

## 2017-12-30 ENCOUNTER — Encounter: Admit: 2017-12-30 | Discharge: 2017-12-30 | Payer: MEDICARE

## 2017-12-30 DIAGNOSIS — R42 Dizziness and giddiness: ICD-10-CM

## 2017-12-30 DIAGNOSIS — M1990 Unspecified osteoarthritis, unspecified site: ICD-10-CM

## 2017-12-30 DIAGNOSIS — M25552 Pain in left hip: Principal | ICD-10-CM

## 2017-12-30 DIAGNOSIS — R5383 Other fatigue: ICD-10-CM

## 2017-12-30 DIAGNOSIS — B353 Tinea pedis: ICD-10-CM

## 2017-12-30 DIAGNOSIS — L304 Erythema intertrigo: ICD-10-CM

## 2017-12-30 DIAGNOSIS — I4891 Unspecified atrial fibrillation: ICD-10-CM

## 2017-12-30 DIAGNOSIS — J302 Other seasonal allergic rhinitis: ICD-10-CM

## 2017-12-30 DIAGNOSIS — M1612 Unilateral primary osteoarthritis, left hip: ICD-10-CM

## 2017-12-30 DIAGNOSIS — Z823 Family history of stroke: ICD-10-CM

## 2017-12-30 DIAGNOSIS — L821 Other seborrheic keratosis: ICD-10-CM

## 2017-12-30 DIAGNOSIS — D489 Neoplasm of uncertain behavior, unspecified: ICD-10-CM

## 2017-12-30 DIAGNOSIS — R001 Bradycardia, unspecified: ICD-10-CM

## 2017-12-30 DIAGNOSIS — M67952 Unspecified disorder of synovium and tendon, left thigh: ICD-10-CM

## 2017-12-30 DIAGNOSIS — L570 Actinic keratosis: ICD-10-CM

## 2017-12-30 DIAGNOSIS — B351 Tinea unguium: ICD-10-CM

## 2017-12-30 DIAGNOSIS — H9190 Unspecified hearing loss, unspecified ear: ICD-10-CM

## 2017-12-30 DIAGNOSIS — I495 Sick sinus syndrome: ICD-10-CM

## 2017-12-30 DIAGNOSIS — L578 Other skin changes due to chronic exposure to nonionizing radiation: ICD-10-CM

## 2017-12-30 DIAGNOSIS — E039 Hypothyroidism, unspecified: ICD-10-CM

## 2017-12-30 DIAGNOSIS — R51 Headache: ICD-10-CM

## 2017-12-30 DIAGNOSIS — M25569 Pain in unspecified knee: ICD-10-CM

## 2017-12-30 DIAGNOSIS — F5221 Male erectile disorder: ICD-10-CM

## 2017-12-30 DIAGNOSIS — Z7901 Long term (current) use of anticoagulants: ICD-10-CM

## 2017-12-30 DIAGNOSIS — K219 Gastro-esophageal reflux disease without esophagitis: ICD-10-CM

## 2017-12-30 DIAGNOSIS — B078 Other viral warts: ICD-10-CM

## 2017-12-30 DIAGNOSIS — E785 Hyperlipidemia, unspecified: ICD-10-CM

## 2017-12-30 DIAGNOSIS — I10 Essential (primary) hypertension: ICD-10-CM

## 2018-01-04 ENCOUNTER — Ambulatory Visit: Admit: 2018-01-04 | Discharge: 2018-01-04 | Payer: MEDICARE

## 2018-01-04 ENCOUNTER — Encounter: Admit: 2018-01-04 | Discharge: 2018-01-04 | Payer: MEDICARE

## 2018-01-04 DIAGNOSIS — L821 Other seborrheic keratosis: ICD-10-CM

## 2018-01-04 DIAGNOSIS — Z950 Presence of cardiac pacemaker: ICD-10-CM

## 2018-01-04 DIAGNOSIS — E785 Hyperlipidemia, unspecified: ICD-10-CM

## 2018-01-04 DIAGNOSIS — B078 Other viral warts: ICD-10-CM

## 2018-01-04 DIAGNOSIS — E782 Mixed hyperlipidemia: ICD-10-CM

## 2018-01-04 DIAGNOSIS — M1990 Unspecified osteoarthritis, unspecified site: ICD-10-CM

## 2018-01-04 DIAGNOSIS — R42 Dizziness and giddiness: ICD-10-CM

## 2018-01-04 DIAGNOSIS — R51 Headache: ICD-10-CM

## 2018-01-04 DIAGNOSIS — R5383 Other fatigue: ICD-10-CM

## 2018-01-04 DIAGNOSIS — E039 Hypothyroidism, unspecified: ICD-10-CM

## 2018-01-04 DIAGNOSIS — L304 Erythema intertrigo: ICD-10-CM

## 2018-01-04 DIAGNOSIS — I495 Sick sinus syndrome: ICD-10-CM

## 2018-01-04 DIAGNOSIS — K219 Gastro-esophageal reflux disease without esophagitis: ICD-10-CM

## 2018-01-04 DIAGNOSIS — Z125 Encounter for screening for malignant neoplasm of prostate: ICD-10-CM

## 2018-01-04 DIAGNOSIS — I4891 Unspecified atrial fibrillation: ICD-10-CM

## 2018-01-04 DIAGNOSIS — Z7901 Long term (current) use of anticoagulants: ICD-10-CM

## 2018-01-04 DIAGNOSIS — I2510 Atherosclerotic heart disease of native coronary artery without angina pectoris: Principal | ICD-10-CM

## 2018-01-04 DIAGNOSIS — H9190 Unspecified hearing loss, unspecified ear: ICD-10-CM

## 2018-01-04 DIAGNOSIS — Z823 Family history of stroke: ICD-10-CM

## 2018-01-04 DIAGNOSIS — B353 Tinea pedis: ICD-10-CM

## 2018-01-04 DIAGNOSIS — J302 Other seasonal allergic rhinitis: ICD-10-CM

## 2018-01-04 DIAGNOSIS — R001 Bradycardia, unspecified: ICD-10-CM

## 2018-01-04 DIAGNOSIS — M25569 Pain in unspecified knee: ICD-10-CM

## 2018-01-04 DIAGNOSIS — I10 Essential (primary) hypertension: ICD-10-CM

## 2018-01-04 DIAGNOSIS — L578 Other skin changes due to chronic exposure to nonionizing radiation: ICD-10-CM

## 2018-01-04 DIAGNOSIS — L570 Actinic keratosis: ICD-10-CM

## 2018-01-04 DIAGNOSIS — D489 Neoplasm of uncertain behavior, unspecified: ICD-10-CM

## 2018-01-04 DIAGNOSIS — F5221 Male erectile disorder: ICD-10-CM

## 2018-01-04 DIAGNOSIS — B351 Tinea unguium: ICD-10-CM

## 2018-01-04 MED ORDER — TAMSULOSIN 0.4 MG PO CAP
.8 mg | ORAL_CAPSULE | Freq: Every day | ORAL | 3 refills | 90.00000 days | Status: AC
Start: 2018-01-04 — End: 2019-02-20

## 2018-01-04 MED ORDER — ATORVASTATIN 40 MG PO TAB
ORAL_TABLET | Freq: Every evening | 0 refills | Status: AC
Start: 2018-01-04 — End: 2018-01-27

## 2018-01-06 ENCOUNTER — Encounter: Admit: 2018-01-06 | Discharge: 2018-01-06 | Payer: MEDICARE

## 2018-01-06 DIAGNOSIS — I441 Atrioventricular block, second degree: Principal | ICD-10-CM

## 2018-01-06 MED ORDER — DIAZEPAM 5 MG PO TAB
5 mg | ORAL_TABLET | ORAL | 0 refills | 7.00000 days | Status: AC | PRN
Start: 2018-01-06 — End: 2018-02-01

## 2018-01-21 ENCOUNTER — Encounter: Admit: 2018-01-21 | Discharge: 2018-01-21 | Payer: MEDICARE

## 2018-01-27 ENCOUNTER — Encounter: Admit: 2018-01-27 | Discharge: 2018-01-27 | Payer: MEDICARE

## 2018-01-27 DIAGNOSIS — I2510 Atherosclerotic heart disease of native coronary artery without angina pectoris: Principal | ICD-10-CM

## 2018-01-27 DIAGNOSIS — E782 Mixed hyperlipidemia: ICD-10-CM

## 2018-01-27 DIAGNOSIS — E039 Hypothyroidism, unspecified: ICD-10-CM

## 2018-01-27 DIAGNOSIS — I4891 Unspecified atrial fibrillation: ICD-10-CM

## 2018-01-27 MED ORDER — EZETIMIBE 10 MG PO TAB
10 mg | ORAL_TABLET | Freq: Every day | ORAL | 1 refills | Status: AC
Start: 2018-01-27 — End: 2018-10-26

## 2018-01-27 MED ORDER — OMEPRAZOLE 20 MG PO CPDR
20 mg | ORAL_CAPSULE | Freq: Every day | ORAL | 1 refills | Status: AC
Start: 2018-01-27 — End: 2018-11-15

## 2018-01-27 MED ORDER — ATORVASTATIN 40 MG PO TAB
40 mg | ORAL_TABLET | Freq: Every evening | ORAL | 1 refills | Status: AC
Start: 2018-01-27 — End: 2018-10-24

## 2018-01-27 MED ORDER — LEVOTHYROXINE 50 MCG PO TAB
50 ug | ORAL_TABLET | Freq: Every day | ORAL | 1 refills | 30.00000 days | Status: AC
Start: 2018-01-27 — End: 2018-11-30

## 2018-01-31 ENCOUNTER — Encounter: Admit: 2018-01-31 | Discharge: 2018-01-31 | Payer: MEDICARE

## 2018-02-01 MED ORDER — DIAZEPAM 5 MG PO TAB
5 mg | ORAL_TABLET | Freq: Once | ORAL | 0 refills | 7.00000 days | Status: AC
Start: 2018-02-01 — End: ?

## 2018-02-03 ENCOUNTER — Encounter: Admit: 2018-02-03 | Discharge: 2018-02-03 | Payer: MEDICARE

## 2018-02-03 ENCOUNTER — Ambulatory Visit: Admit: 2018-02-03 | Discharge: 2018-02-04 | Payer: MEDICARE

## 2018-02-03 ENCOUNTER — Ambulatory Visit: Admit: 2018-02-03 | Discharge: 2018-02-03 | Payer: MEDICARE

## 2018-02-03 DIAGNOSIS — Z950 Presence of cardiac pacemaker: ICD-10-CM

## 2018-02-03 DIAGNOSIS — I472 Ventricular tachycardia: Principal | ICD-10-CM

## 2018-02-03 DIAGNOSIS — I441 Atrioventricular block, second degree: ICD-10-CM

## 2018-02-03 MED ORDER — SODIUM CHLORIDE 0.9 % IJ SOLN
50 mL | Freq: Once | INTRAVENOUS | 0 refills | Status: AC
Start: 2018-02-03 — End: ?

## 2018-02-03 MED ORDER — DIAZEPAM 5 MG PO TAB
5 mg | ORAL_TABLET | Freq: Once | ORAL | 0 refills | 7.00000 days | Status: AC
Start: 2018-02-03 — End: ?
  Filled 2018-02-03 (×2): qty 1, 1d supply, fill #1

## 2018-02-03 MED ORDER — GADOBENATE DIMEGLUMINE 529 MG/ML (0.1MMOL/0.2ML) IV SOLN
40 mL | Freq: Once | INTRAVENOUS | 0 refills | Status: CP
Start: 2018-02-03 — End: ?
  Administered 2018-02-03: 18:00:00 40 mL via INTRAVENOUS

## 2018-02-08 ENCOUNTER — Encounter: Admit: 2018-02-08 | Discharge: 2018-02-08 | Payer: MEDICARE

## 2018-02-10 ENCOUNTER — Encounter: Admit: 2018-02-10 | Discharge: 2018-02-10 | Payer: MEDICARE

## 2018-02-18 ENCOUNTER — Encounter: Admit: 2018-02-18 | Discharge: 2018-02-18 | Payer: MEDICARE

## 2018-03-07 ENCOUNTER — Encounter: Admit: 2018-03-07 | Discharge: 2018-03-07 | Payer: MEDICARE

## 2018-03-11 ENCOUNTER — Encounter: Admit: 2018-03-11 | Discharge: 2018-03-11 | Payer: MEDICARE

## 2018-03-18 ENCOUNTER — Encounter: Admit: 2018-03-18 | Discharge: 2018-03-18 | Payer: MEDICARE

## 2018-03-18 DIAGNOSIS — I4819 Other persistent atrial fibrillation: Principal | ICD-10-CM

## 2018-03-18 DIAGNOSIS — Z950 Presence of cardiac pacemaker: ICD-10-CM

## 2018-03-21 ENCOUNTER — Encounter: Admit: 2018-03-21 | Discharge: 2018-03-21 | Payer: MEDICARE

## 2018-03-21 ENCOUNTER — Ambulatory Visit: Admit: 2018-03-21 | Discharge: 2018-03-21 | Payer: MEDICARE

## 2018-03-21 DIAGNOSIS — R51 Headache: ICD-10-CM

## 2018-03-21 DIAGNOSIS — L304 Erythema intertrigo: ICD-10-CM

## 2018-03-21 DIAGNOSIS — L578 Other skin changes due to chronic exposure to nonionizing radiation: ICD-10-CM

## 2018-03-21 DIAGNOSIS — I4819 Other persistent atrial fibrillation: ICD-10-CM

## 2018-03-21 DIAGNOSIS — M25569 Pain in unspecified knee: ICD-10-CM

## 2018-03-21 DIAGNOSIS — I4891 Unspecified atrial fibrillation: ICD-10-CM

## 2018-03-21 DIAGNOSIS — R001 Bradycardia, unspecified: ICD-10-CM

## 2018-03-21 DIAGNOSIS — L570 Actinic keratosis: ICD-10-CM

## 2018-03-21 DIAGNOSIS — Z950 Presence of cardiac pacemaker: ICD-10-CM

## 2018-03-21 DIAGNOSIS — F5221 Male erectile disorder: ICD-10-CM

## 2018-03-21 DIAGNOSIS — D489 Neoplasm of uncertain behavior, unspecified: ICD-10-CM

## 2018-03-21 DIAGNOSIS — K219 Gastro-esophageal reflux disease without esophagitis: ICD-10-CM

## 2018-03-21 DIAGNOSIS — R5383 Other fatigue: ICD-10-CM

## 2018-03-21 DIAGNOSIS — B353 Tinea pedis: ICD-10-CM

## 2018-03-21 DIAGNOSIS — I10 Essential (primary) hypertension: ICD-10-CM

## 2018-03-21 DIAGNOSIS — Z823 Family history of stroke: ICD-10-CM

## 2018-03-21 DIAGNOSIS — J302 Other seasonal allergic rhinitis: ICD-10-CM

## 2018-03-21 DIAGNOSIS — E785 Hyperlipidemia, unspecified: ICD-10-CM

## 2018-03-21 DIAGNOSIS — E039 Hypothyroidism, unspecified: ICD-10-CM

## 2018-03-21 DIAGNOSIS — B351 Tinea unguium: ICD-10-CM

## 2018-03-21 DIAGNOSIS — I481 Persistent atrial fibrillation: Principal | ICD-10-CM

## 2018-03-21 DIAGNOSIS — M1990 Unspecified osteoarthritis, unspecified site: ICD-10-CM

## 2018-03-21 DIAGNOSIS — H9190 Unspecified hearing loss, unspecified ear: ICD-10-CM

## 2018-03-21 DIAGNOSIS — Z7901 Long term (current) use of anticoagulants: ICD-10-CM

## 2018-03-21 DIAGNOSIS — B078 Other viral warts: ICD-10-CM

## 2018-03-21 DIAGNOSIS — R42 Dizziness and giddiness: ICD-10-CM

## 2018-03-21 DIAGNOSIS — I495 Sick sinus syndrome: ICD-10-CM

## 2018-03-21 DIAGNOSIS — L821 Other seborrheic keratosis: ICD-10-CM

## 2018-03-24 ENCOUNTER — Encounter: Admit: 2018-03-24 | Discharge: 2018-03-24 | Payer: MEDICARE

## 2018-03-24 DIAGNOSIS — M1990 Unspecified osteoarthritis, unspecified site: ICD-10-CM

## 2018-03-24 DIAGNOSIS — Z823 Family history of stroke: ICD-10-CM

## 2018-03-24 DIAGNOSIS — L570 Actinic keratosis: ICD-10-CM

## 2018-03-24 DIAGNOSIS — B078 Other viral warts: ICD-10-CM

## 2018-03-24 DIAGNOSIS — B353 Tinea pedis: ICD-10-CM

## 2018-03-24 DIAGNOSIS — L304 Erythema intertrigo: ICD-10-CM

## 2018-03-24 DIAGNOSIS — I4891 Unspecified atrial fibrillation: ICD-10-CM

## 2018-03-24 DIAGNOSIS — D489 Neoplasm of uncertain behavior, unspecified: ICD-10-CM

## 2018-03-24 DIAGNOSIS — H9190 Unspecified hearing loss, unspecified ear: ICD-10-CM

## 2018-03-24 DIAGNOSIS — F5221 Male erectile disorder: ICD-10-CM

## 2018-03-24 DIAGNOSIS — R42 Dizziness and giddiness: ICD-10-CM

## 2018-03-24 DIAGNOSIS — E785 Hyperlipidemia, unspecified: ICD-10-CM

## 2018-03-24 DIAGNOSIS — J302 Other seasonal allergic rhinitis: ICD-10-CM

## 2018-03-24 DIAGNOSIS — R51 Headache: ICD-10-CM

## 2018-03-24 DIAGNOSIS — R001 Bradycardia, unspecified: ICD-10-CM

## 2018-03-24 DIAGNOSIS — I495 Sick sinus syndrome: ICD-10-CM

## 2018-03-24 DIAGNOSIS — L821 Other seborrheic keratosis: ICD-10-CM

## 2018-03-24 DIAGNOSIS — B351 Tinea unguium: ICD-10-CM

## 2018-03-24 DIAGNOSIS — R5383 Other fatigue: ICD-10-CM

## 2018-03-24 DIAGNOSIS — Z7901 Long term (current) use of anticoagulants: ICD-10-CM

## 2018-03-24 DIAGNOSIS — I10 Essential (primary) hypertension: ICD-10-CM

## 2018-03-24 DIAGNOSIS — K219 Gastro-esophageal reflux disease without esophagitis: ICD-10-CM

## 2018-03-24 DIAGNOSIS — L578 Other skin changes due to chronic exposure to nonionizing radiation: ICD-10-CM

## 2018-03-24 DIAGNOSIS — M25569 Pain in unspecified knee: ICD-10-CM

## 2018-03-24 DIAGNOSIS — E039 Hypothyroidism, unspecified: ICD-10-CM

## 2018-04-08 ENCOUNTER — Encounter: Admit: 2018-04-08 | Discharge: 2018-04-08 | Payer: MEDICARE

## 2018-04-08 DIAGNOSIS — I4891 Unspecified atrial fibrillation: Principal | ICD-10-CM

## 2018-04-08 LAB — PROTIME INR (PT): Lab: 2.1

## 2018-04-22 ENCOUNTER — Encounter: Admit: 2018-04-22 | Discharge: 2018-04-22 | Payer: MEDICARE

## 2018-04-22 DIAGNOSIS — I4891 Unspecified atrial fibrillation: Principal | ICD-10-CM

## 2018-04-29 ENCOUNTER — Encounter: Admit: 2018-04-29 | Discharge: 2018-04-29 | Payer: MEDICARE

## 2018-04-29 DIAGNOSIS — I4891 Unspecified atrial fibrillation: Principal | ICD-10-CM

## 2018-05-02 ENCOUNTER — Encounter: Admit: 2018-05-02 | Discharge: 2018-05-02 | Payer: MEDICARE

## 2018-05-02 DIAGNOSIS — I358 Other nonrheumatic aortic valve disorders: Principal | ICD-10-CM

## 2018-05-02 MED ORDER — WARFARIN 4 MG PO TAB
ORAL_TABLET | Freq: Every day | ORAL | 3 refills | 90.00000 days | Status: AC
Start: 2018-05-02 — End: 2019-04-28

## 2018-05-05 ENCOUNTER — Ambulatory Visit: Admit: 2018-05-05 | Discharge: 2018-05-06 | Payer: MEDICARE

## 2018-05-05 ENCOUNTER — Encounter: Admit: 2018-05-05 | Discharge: 2018-05-05 | Payer: MEDICARE

## 2018-05-05 DIAGNOSIS — I4891 Unspecified atrial fibrillation: Principal | ICD-10-CM

## 2018-05-06 DIAGNOSIS — I441 Atrioventricular block, second degree: Principal | ICD-10-CM

## 2018-05-06 DIAGNOSIS — Z950 Presence of cardiac pacemaker: ICD-10-CM

## 2018-05-24 ENCOUNTER — Encounter: Admit: 2018-05-24 | Discharge: 2018-05-24 | Payer: MEDICARE

## 2018-05-24 DIAGNOSIS — I4891 Unspecified atrial fibrillation: Principal | ICD-10-CM

## 2018-06-06 ENCOUNTER — Encounter: Admit: 2018-06-06 | Discharge: 2018-06-06 | Payer: MEDICARE

## 2018-06-20 ENCOUNTER — Encounter: Admit: 2018-06-20 | Discharge: 2018-06-20 | Payer: MEDICARE

## 2018-06-20 DIAGNOSIS — I4891 Unspecified atrial fibrillation: Principal | ICD-10-CM

## 2018-06-29 ENCOUNTER — Encounter: Admit: 2018-06-29 | Discharge: 2018-06-29 | Payer: MEDICARE

## 2018-07-05 ENCOUNTER — Ambulatory Visit: Admit: 2018-07-05 | Discharge: 2018-07-05 | Payer: MEDICARE

## 2018-07-05 ENCOUNTER — Encounter: Admit: 2018-07-05 | Discharge: 2018-07-05 | Payer: MEDICARE

## 2018-07-05 DIAGNOSIS — R51 Headache: ICD-10-CM

## 2018-07-05 DIAGNOSIS — E039 Hypothyroidism, unspecified: ICD-10-CM

## 2018-07-05 DIAGNOSIS — I2510 Atherosclerotic heart disease of native coronary artery without angina pectoris: ICD-10-CM

## 2018-07-05 DIAGNOSIS — I10 Essential (primary) hypertension: ICD-10-CM

## 2018-07-05 DIAGNOSIS — E782 Mixed hyperlipidemia: Principal | ICD-10-CM

## 2018-07-05 DIAGNOSIS — R7301 Impaired fasting glucose: ICD-10-CM

## 2018-07-05 DIAGNOSIS — R42 Dizziness and giddiness: ICD-10-CM

## 2018-07-05 DIAGNOSIS — M1990 Unspecified osteoarthritis, unspecified site: ICD-10-CM

## 2018-07-05 DIAGNOSIS — Z125 Encounter for screening for malignant neoplasm of prostate: ICD-10-CM

## 2018-07-05 DIAGNOSIS — G5602 Carpal tunnel syndrome, left upper limb: Secondary | ICD-10-CM

## 2018-07-05 DIAGNOSIS — I4891 Unspecified atrial fibrillation: ICD-10-CM

## 2018-07-05 DIAGNOSIS — M25569 Pain in unspecified knee: ICD-10-CM

## 2018-07-05 DIAGNOSIS — H9190 Unspecified hearing loss, unspecified ear: ICD-10-CM

## 2018-07-05 DIAGNOSIS — F4310 Post-traumatic stress disorder, unspecified: ICD-10-CM

## 2018-07-05 DIAGNOSIS — N401 Enlarged prostate with lower urinary tract symptoms: ICD-10-CM

## 2018-07-05 DIAGNOSIS — B351 Tinea unguium: ICD-10-CM

## 2018-07-05 DIAGNOSIS — Z7901 Long term (current) use of anticoagulants: ICD-10-CM

## 2018-07-05 DIAGNOSIS — E785 Hyperlipidemia, unspecified: ICD-10-CM

## 2018-07-05 DIAGNOSIS — L304 Erythema intertrigo: ICD-10-CM

## 2018-07-05 DIAGNOSIS — B353 Tinea pedis: ICD-10-CM

## 2018-07-05 DIAGNOSIS — K219 Gastro-esophageal reflux disease without esophagitis: ICD-10-CM

## 2018-07-05 DIAGNOSIS — D489 Neoplasm of uncertain behavior, unspecified: ICD-10-CM

## 2018-07-05 DIAGNOSIS — F5221 Male erectile disorder: ICD-10-CM

## 2018-07-05 DIAGNOSIS — B078 Other viral warts: ICD-10-CM

## 2018-07-05 DIAGNOSIS — Z823 Family history of stroke: ICD-10-CM

## 2018-07-05 DIAGNOSIS — L821 Other seborrheic keratosis: ICD-10-CM

## 2018-07-05 DIAGNOSIS — J302 Other seasonal allergic rhinitis: ICD-10-CM

## 2018-07-05 DIAGNOSIS — R001 Bradycardia, unspecified: ICD-10-CM

## 2018-07-05 DIAGNOSIS — Z950 Presence of cardiac pacemaker: ICD-10-CM

## 2018-07-05 DIAGNOSIS — L570 Actinic keratosis: ICD-10-CM

## 2018-07-05 DIAGNOSIS — I495 Sick sinus syndrome: ICD-10-CM

## 2018-07-05 DIAGNOSIS — L578 Other skin changes due to chronic exposure to nonionizing radiation: ICD-10-CM

## 2018-07-05 DIAGNOSIS — R5383 Other fatigue: ICD-10-CM

## 2018-07-05 LAB — LIPID PROFILE
Lab: 142 mg/dL (ref <200)
Lab: 81 mg/dL (ref <150)

## 2018-07-05 LAB — LIVER FUNCTION PANEL
Lab: 0.2 mg/dL (ref <0.4)
Lab: 0.8 mg/dL (ref >40)
Lab: 21 U/L (ref 7–40)
Lab: 44 U/L (ref 25–110)
Lab: 6.5 g/dL (ref >60)

## 2018-07-05 LAB — PSA SCREEN: Lab: 2.1 ng/mL — ABNORMAL LOW (ref <6.01)

## 2018-07-05 LAB — TSH WITH FREE T4 REFLEX: Lab: 1 uU/mL (ref 0.35–5.00)

## 2018-07-05 LAB — BASIC METABOLIC PANEL
Lab: 139 MMOL/L (ref 137–147)
Lab: 4.4 MMOL/L (ref 3.5–5.1)

## 2018-07-05 LAB — CBC AND DIFF
Lab: 0 10*3/uL (ref 0–0.20)
Lab: 0.2 10*3/uL (ref 0–0.45)
Lab: 0.7 10*3/uL (ref 0–0.80)
Lab: 1 % (ref 0–2)
Lab: 1.1 10*3/uL (ref 1.0–4.8)
Lab: 3 % (ref 0–5)
Lab: 4 10*3/uL (ref 1.8–7.0)
Lab: 4.7 M/UL (ref 4.4–5.5)
Lab: 6 10*3/uL (ref 4.5–11.0)

## 2018-07-06 ENCOUNTER — Encounter: Admit: 2018-07-06 | Discharge: 2018-07-06 | Payer: MEDICARE

## 2018-07-22 ENCOUNTER — Encounter: Admit: 2018-07-22 | Discharge: 2018-07-22 | Payer: MEDICARE

## 2018-07-29 ENCOUNTER — Encounter: Admit: 2018-07-29 | Discharge: 2018-07-29 | Payer: MEDICARE

## 2018-08-04 ENCOUNTER — Ambulatory Visit: Admit: 2018-08-04 | Discharge: 2018-08-05 | Payer: MEDICARE

## 2018-08-05 ENCOUNTER — Encounter: Admit: 2018-08-05 | Discharge: 2018-08-05 | Payer: MEDICARE

## 2018-08-05 DIAGNOSIS — I441 Atrioventricular block, second degree: Principal | ICD-10-CM

## 2018-08-05 DIAGNOSIS — Z950 Presence of cardiac pacemaker: ICD-10-CM

## 2018-08-05 DIAGNOSIS — I4891 Unspecified atrial fibrillation: Principal | ICD-10-CM

## 2018-08-11 ENCOUNTER — Encounter: Admit: 2018-08-11 | Discharge: 2018-08-11 | Payer: MEDICARE

## 2018-08-11 DIAGNOSIS — I4891 Unspecified atrial fibrillation: Principal | ICD-10-CM

## 2018-08-19 ENCOUNTER — Encounter: Admit: 2018-08-19 | Discharge: 2018-08-19 | Payer: MEDICARE

## 2018-09-05 ENCOUNTER — Encounter: Admit: 2018-09-05 | Discharge: 2018-09-05 | Payer: MEDICARE

## 2018-09-08 ENCOUNTER — Encounter: Admit: 2018-09-08 | Discharge: 2018-09-08 | Payer: MEDICARE

## 2018-09-12 ENCOUNTER — Encounter: Admit: 2018-09-12 | Discharge: 2018-09-12 | Payer: MEDICARE

## 2018-09-12 MED ORDER — LISINOPRIL 20 MG PO TAB
ORAL_TABLET | Freq: Every day | 1 refills | Status: AC
Start: 2018-09-12 — End: 2019-04-17

## 2018-09-21 ENCOUNTER — Encounter: Admit: 2018-09-21 | Discharge: 2018-09-21 | Payer: MEDICARE

## 2018-09-21 DIAGNOSIS — I4891 Unspecified atrial fibrillation: Principal | ICD-10-CM

## 2018-10-04 ENCOUNTER — Encounter: Admit: 2018-10-04 | Discharge: 2018-10-04 | Payer: MEDICARE

## 2018-10-04 DIAGNOSIS — I4891 Unspecified atrial fibrillation: Principal | ICD-10-CM

## 2018-10-07 ENCOUNTER — Encounter: Admit: 2018-10-07 | Discharge: 2018-10-07 | Payer: MEDICARE

## 2018-10-20 ENCOUNTER — Encounter: Admit: 2018-10-20 | Discharge: 2018-10-20 | Payer: MEDICARE

## 2018-10-20 ENCOUNTER — Ambulatory Visit: Admit: 2018-10-20 | Discharge: 2018-10-20 | Payer: MEDICARE

## 2018-10-20 DIAGNOSIS — Z7901 Long term (current) use of anticoagulants: ICD-10-CM

## 2018-10-20 DIAGNOSIS — I480 Paroxysmal atrial fibrillation: ICD-10-CM

## 2018-10-20 DIAGNOSIS — I10 Essential (primary) hypertension: ICD-10-CM

## 2018-10-20 DIAGNOSIS — S0990XA Unspecified injury of head, initial encounter: Principal | ICD-10-CM

## 2018-10-24 ENCOUNTER — Encounter: Admit: 2018-10-24 | Discharge: 2018-10-24 | Payer: MEDICARE

## 2018-10-24 DIAGNOSIS — E782 Mixed hyperlipidemia: ICD-10-CM

## 2018-10-24 DIAGNOSIS — I4891 Unspecified atrial fibrillation: ICD-10-CM

## 2018-10-24 DIAGNOSIS — I2510 Atherosclerotic heart disease of native coronary artery without angina pectoris: Principal | ICD-10-CM

## 2018-10-24 DIAGNOSIS — E039 Hypothyroidism, unspecified: ICD-10-CM

## 2018-10-24 MED ORDER — ATORVASTATIN 40 MG PO TAB
40 mg | ORAL_TABLET | Freq: Every evening | ORAL | 1 refills | Status: AC
Start: 2018-10-24 — End: 2019-04-26

## 2018-10-26 ENCOUNTER — Encounter: Admit: 2018-10-26 | Discharge: 2018-10-26 | Payer: MEDICARE

## 2018-10-26 DIAGNOSIS — I2510 Atherosclerotic heart disease of native coronary artery without angina pectoris: Principal | ICD-10-CM

## 2018-10-26 DIAGNOSIS — I4891 Unspecified atrial fibrillation: ICD-10-CM

## 2018-10-26 DIAGNOSIS — E782 Mixed hyperlipidemia: ICD-10-CM

## 2018-10-26 DIAGNOSIS — E039 Hypothyroidism, unspecified: ICD-10-CM

## 2018-10-26 MED ORDER — EZETIMIBE 10 MG PO TAB
10 mg | ORAL_TABLET | Freq: Every day | ORAL | 1 refills | Status: AC
Start: 2018-10-26 — End: 2019-06-09

## 2018-11-03 ENCOUNTER — Ambulatory Visit: Admit: 2018-11-03 | Discharge: 2018-11-04 | Payer: MEDICARE

## 2018-11-03 DIAGNOSIS — Z950 Presence of cardiac pacemaker: Secondary | ICD-10-CM

## 2018-11-04 DIAGNOSIS — I441 Atrioventricular block, second degree: Principal | ICD-10-CM

## 2018-11-09 ENCOUNTER — Encounter: Admit: 2018-11-09 | Discharge: 2018-11-09 | Payer: MEDICARE

## 2018-11-10 ENCOUNTER — Encounter: Admit: 2018-11-10 | Discharge: 2018-11-10 | Payer: MEDICARE

## 2018-11-10 DIAGNOSIS — I4891 Unspecified atrial fibrillation: Principal | ICD-10-CM

## 2018-11-14 ENCOUNTER — Ambulatory Visit: Admit: 2018-11-14 | Discharge: 2018-11-14 | Payer: MEDICARE

## 2018-11-14 ENCOUNTER — Encounter: Admit: 2018-11-14 | Discharge: 2018-11-14 | Payer: MEDICARE

## 2018-11-14 DIAGNOSIS — J302 Other seasonal allergic rhinitis: ICD-10-CM

## 2018-11-14 DIAGNOSIS — B078 Other viral warts: ICD-10-CM

## 2018-11-14 DIAGNOSIS — Z950 Presence of cardiac pacemaker: ICD-10-CM

## 2018-11-14 DIAGNOSIS — I10 Essential (primary) hypertension: ICD-10-CM

## 2018-11-14 DIAGNOSIS — H9190 Unspecified hearing loss, unspecified ear: ICD-10-CM

## 2018-11-14 DIAGNOSIS — Z7901 Long term (current) use of anticoagulants: ICD-10-CM

## 2018-11-14 DIAGNOSIS — I4891 Unspecified atrial fibrillation: ICD-10-CM

## 2018-11-14 DIAGNOSIS — I495 Sick sinus syndrome: ICD-10-CM

## 2018-11-14 DIAGNOSIS — I4819 Other persistent atrial fibrillation: ICD-10-CM

## 2018-11-14 DIAGNOSIS — I480 Paroxysmal atrial fibrillation: ICD-10-CM

## 2018-11-14 DIAGNOSIS — L578 Other skin changes due to chronic exposure to nonionizing radiation: ICD-10-CM

## 2018-11-14 DIAGNOSIS — I2510 Atherosclerotic heart disease of native coronary artery without angina pectoris: ICD-10-CM

## 2018-11-14 DIAGNOSIS — I472 Ventricular tachycardia: ICD-10-CM

## 2018-11-14 DIAGNOSIS — L570 Actinic keratosis: ICD-10-CM

## 2018-11-14 DIAGNOSIS — R5383 Other fatigue: ICD-10-CM

## 2018-11-14 DIAGNOSIS — M1990 Unspecified osteoarthritis, unspecified site: ICD-10-CM

## 2018-11-14 DIAGNOSIS — E039 Hypothyroidism, unspecified: ICD-10-CM

## 2018-11-14 DIAGNOSIS — I358 Other nonrheumatic aortic valve disorders: ICD-10-CM

## 2018-11-14 DIAGNOSIS — E785 Hyperlipidemia, unspecified: ICD-10-CM

## 2018-11-14 DIAGNOSIS — L304 Erythema intertrigo: ICD-10-CM

## 2018-11-14 DIAGNOSIS — E782 Mixed hyperlipidemia: ICD-10-CM

## 2018-11-14 DIAGNOSIS — Z823 Family history of stroke: ICD-10-CM

## 2018-11-14 DIAGNOSIS — M25569 Pain in unspecified knee: ICD-10-CM

## 2018-11-14 DIAGNOSIS — B351 Tinea unguium: ICD-10-CM

## 2018-11-14 DIAGNOSIS — Z23 Encounter for immunization: ICD-10-CM

## 2018-11-14 DIAGNOSIS — L821 Other seborrheic keratosis: ICD-10-CM

## 2018-11-14 DIAGNOSIS — R21 Rash and other nonspecific skin eruption: ICD-10-CM

## 2018-11-14 DIAGNOSIS — B353 Tinea pedis: ICD-10-CM

## 2018-11-14 DIAGNOSIS — R51 Headache: ICD-10-CM

## 2018-11-14 DIAGNOSIS — R001 Bradycardia, unspecified: ICD-10-CM

## 2018-11-14 DIAGNOSIS — E7849 Other hyperlipidemia: ICD-10-CM

## 2018-11-14 DIAGNOSIS — K219 Gastro-esophageal reflux disease without esophagitis: ICD-10-CM

## 2018-11-14 DIAGNOSIS — R42 Dizziness and giddiness: ICD-10-CM

## 2018-11-14 DIAGNOSIS — D489 Neoplasm of uncertain behavior, unspecified: ICD-10-CM

## 2018-11-14 DIAGNOSIS — F5221 Male erectile disorder: ICD-10-CM

## 2018-11-15 ENCOUNTER — Encounter: Admit: 2018-11-15 | Discharge: 2018-11-15 | Payer: MEDICARE

## 2018-11-15 DIAGNOSIS — E039 Hypothyroidism, unspecified: ICD-10-CM

## 2018-11-15 DIAGNOSIS — E782 Mixed hyperlipidemia: ICD-10-CM

## 2018-11-15 DIAGNOSIS — I4891 Unspecified atrial fibrillation: ICD-10-CM

## 2018-11-15 DIAGNOSIS — I2510 Atherosclerotic heart disease of native coronary artery without angina pectoris: Principal | ICD-10-CM

## 2018-11-15 MED ORDER — OMEPRAZOLE 20 MG PO CPDR
20 mg | ORAL_CAPSULE | Freq: Every day | ORAL | 1 refills | Status: AC
Start: 2018-11-15 — End: 2019-09-12

## 2018-11-22 ENCOUNTER — Encounter: Admit: 2018-11-22 | Discharge: 2018-11-22 | Payer: MEDICARE

## 2018-11-22 DIAGNOSIS — I480 Paroxysmal atrial fibrillation: Principal | ICD-10-CM

## 2018-11-28 ENCOUNTER — Encounter: Admit: 2018-11-28 | Discharge: 2018-11-28 | Payer: MEDICARE

## 2018-11-28 DIAGNOSIS — I480 Paroxysmal atrial fibrillation: Principal | ICD-10-CM

## 2018-11-30 ENCOUNTER — Encounter: Admit: 2018-11-30 | Discharge: 2018-11-30 | Payer: MEDICARE

## 2018-11-30 DIAGNOSIS — E039 Hypothyroidism, unspecified: ICD-10-CM

## 2018-11-30 DIAGNOSIS — E782 Mixed hyperlipidemia: ICD-10-CM

## 2018-11-30 DIAGNOSIS — I2510 Atherosclerotic heart disease of native coronary artery without angina pectoris: Principal | ICD-10-CM

## 2018-11-30 DIAGNOSIS — I4891 Unspecified atrial fibrillation: ICD-10-CM

## 2018-11-30 MED ORDER — LEVOTHYROXINE 50 MCG PO TAB
ORAL_TABLET | Freq: Every day | ORAL | 1 refills | 30.00000 days | Status: AC
Start: 2018-11-30 — End: 2019-07-18

## 2018-12-19 ENCOUNTER — Encounter: Admit: 2018-12-19 | Discharge: 2018-12-19 | Payer: MEDICARE

## 2018-12-19 DIAGNOSIS — I480 Paroxysmal atrial fibrillation: Principal | ICD-10-CM

## 2018-12-30 ENCOUNTER — Encounter: Admit: 2018-12-30 | Discharge: 2018-12-30 | Payer: MEDICARE

## 2019-01-03 ENCOUNTER — Encounter: Admit: 2019-01-03 | Discharge: 2019-01-03 | Payer: MEDICARE

## 2019-01-03 ENCOUNTER — Ambulatory Visit: Admit: 2019-01-03 | Discharge: 2019-01-03 | Payer: MEDICARE

## 2019-01-03 DIAGNOSIS — I2510 Atherosclerotic heart disease of native coronary artery without angina pectoris: Secondary | ICD-10-CM

## 2019-01-03 DIAGNOSIS — B351 Tinea unguium: Secondary | ICD-10-CM

## 2019-01-03 DIAGNOSIS — N401 Enlarged prostate with lower urinary tract symptoms: Secondary | ICD-10-CM

## 2019-01-03 DIAGNOSIS — L578 Other skin changes due to chronic exposure to nonionizing radiation: Secondary | ICD-10-CM

## 2019-01-03 DIAGNOSIS — I4891 Unspecified atrial fibrillation: Secondary | ICD-10-CM

## 2019-01-03 DIAGNOSIS — E785 Hyperlipidemia, unspecified: Secondary | ICD-10-CM

## 2019-01-03 DIAGNOSIS — I480 Paroxysmal atrial fibrillation: Secondary | ICD-10-CM

## 2019-01-03 DIAGNOSIS — R42 Dizziness and giddiness: Secondary | ICD-10-CM

## 2019-01-03 DIAGNOSIS — Z7901 Long term (current) use of anticoagulants: Secondary | ICD-10-CM

## 2019-01-03 DIAGNOSIS — B353 Tinea pedis: Secondary | ICD-10-CM

## 2019-01-03 DIAGNOSIS — C4490 Unspecified malignant neoplasm of skin, unspecified: Secondary | ICD-10-CM

## 2019-01-03 DIAGNOSIS — R7301 Impaired fasting glucose: Secondary | ICD-10-CM

## 2019-01-03 DIAGNOSIS — H9190 Unspecified hearing loss, unspecified ear: Secondary | ICD-10-CM

## 2019-01-03 DIAGNOSIS — L821 Other seborrheic keratosis: Secondary | ICD-10-CM

## 2019-01-03 DIAGNOSIS — E039 Hypothyroidism, unspecified: Secondary | ICD-10-CM

## 2019-01-03 DIAGNOSIS — B078 Other viral warts: Secondary | ICD-10-CM

## 2019-01-03 DIAGNOSIS — I495 Sick sinus syndrome: Secondary | ICD-10-CM

## 2019-01-03 DIAGNOSIS — Z950 Presence of cardiac pacemaker: Secondary | ICD-10-CM

## 2019-01-03 DIAGNOSIS — R5383 Other fatigue: Secondary | ICD-10-CM

## 2019-01-03 DIAGNOSIS — E782 Mixed hyperlipidemia: Secondary | ICD-10-CM

## 2019-01-03 DIAGNOSIS — L304 Erythema intertrigo: Secondary | ICD-10-CM

## 2019-01-03 DIAGNOSIS — Z823 Family history of stroke: Secondary | ICD-10-CM

## 2019-01-03 DIAGNOSIS — L570 Actinic keratosis: Secondary | ICD-10-CM

## 2019-01-03 DIAGNOSIS — R51 Headache: Secondary | ICD-10-CM

## 2019-01-03 DIAGNOSIS — M25569 Pain in unspecified knee: Secondary | ICD-10-CM

## 2019-01-03 DIAGNOSIS — D489 Neoplasm of uncertain behavior, unspecified: Secondary | ICD-10-CM

## 2019-01-03 DIAGNOSIS — R001 Bradycardia, unspecified: Secondary | ICD-10-CM

## 2019-01-03 DIAGNOSIS — F5221 Male erectile disorder: Secondary | ICD-10-CM

## 2019-01-03 DIAGNOSIS — M1990 Unspecified osteoarthritis, unspecified site: Secondary | ICD-10-CM

## 2019-01-03 DIAGNOSIS — I10 Essential (primary) hypertension: Secondary | ICD-10-CM

## 2019-01-03 DIAGNOSIS — J302 Other seasonal allergic rhinitis: Secondary | ICD-10-CM

## 2019-01-03 DIAGNOSIS — K219 Gastro-esophageal reflux disease without esophagitis: Secondary | ICD-10-CM

## 2019-01-03 LAB — LIVER FUNCTION PANEL
Lab: 0.3 mg/dL (ref <0.4)
Lab: 0.9 mg/dL (ref 0.3–1.2)
Lab: 4.3 g/dL (ref 3.5–5.0)
Lab: 40 U/L (ref 25–110)

## 2019-01-03 LAB — LIPID PROFILE
Lab: 128 mg/dL (ref <200)
Lab: 68 mg/dL (ref <100)
Lab: 79 mg/dL (ref <150)

## 2019-01-03 LAB — CBC AND DIFF
Lab: 0 10*3/uL (ref 0–0.20)
Lab: 0.2 10*3/uL (ref 0–0.45)
Lab: 0.7 10*3/uL (ref 0–0.80)
Lab: 1 % (ref 0–2)
Lab: 1.3 10*3/uL (ref 1.0–4.8)
Lab: 13 % (ref >60)
Lab: 16 g/dL (ref 13.5–16.5)
Lab: 17 % — ABNORMAL LOW (ref 24–44)
Lab: 193 10*3/uL (ref >60)
Lab: 3 % (ref 0–5)
Lab: 33 g/dL (ref 32.0–36.0)
Lab: 33 pg (ref 26–34)
Lab: 4.8 M/UL (ref 4.4–5.5)
Lab: 48 % (ref 40–50)
Lab: 5.4 10*3/uL (ref 1.8–7.0)
Lab: 7.5 FL (ref 7–11)
Lab: 7.7 K/UL (ref 4.5–11.0)
Lab: 70 % (ref 41–77)
Lab: 9 % (ref 4–12)
Lab: 99 FL (ref 80–100)

## 2019-01-03 LAB — BASIC METABOLIC PANEL
Lab: 136 MMOL/L — ABNORMAL LOW (ref 137–147)
Lab: 4.5 MMOL/L (ref 3.5–5.1)

## 2019-01-03 LAB — HEMOGLOBIN A1C: Lab: 5.6 % (ref >40)

## 2019-01-03 LAB — TSH WITH FREE T4 REFLEX: Lab: 1.5 uU/mL (ref 0.35–5.00)

## 2019-01-04 ENCOUNTER — Encounter: Admit: 2019-01-04 | Discharge: 2019-01-04 | Payer: MEDICARE

## 2019-01-06 ENCOUNTER — Encounter: Admit: 2019-01-06 | Discharge: 2019-01-06 | Payer: MEDICARE

## 2019-01-10 ENCOUNTER — Ambulatory Visit: Admit: 2019-01-10 | Discharge: 2019-01-11 | Payer: MEDICARE

## 2019-01-10 ENCOUNTER — Ambulatory Visit: Admit: 2019-01-10 | Discharge: 2019-01-10 | Payer: MEDICARE

## 2019-01-10 ENCOUNTER — Encounter: Admit: 2019-01-10 | Discharge: 2019-01-10 | Payer: MEDICARE

## 2019-01-10 DIAGNOSIS — I10 Essential (primary) hypertension: Secondary | ICD-10-CM

## 2019-01-10 DIAGNOSIS — I2510 Atherosclerotic heart disease of native coronary artery without angina pectoris: Secondary | ICD-10-CM

## 2019-01-10 DIAGNOSIS — I472 Ventricular tachycardia: Secondary | ICD-10-CM

## 2019-01-10 DIAGNOSIS — E782 Mixed hyperlipidemia: Secondary | ICD-10-CM

## 2019-01-10 DIAGNOSIS — I358 Other nonrheumatic aortic valve disorders: Secondary | ICD-10-CM

## 2019-01-10 MED ORDER — BENZOCAINE-MENTHOL 6-10 MG MM LOZG
1 | Freq: Once | ORAL | 0 refills | Status: AC | PRN
Start: 2019-01-10 — End: ?

## 2019-01-10 MED ORDER — SODIUM CHLORIDE 0.9 % IV SOLP
250 mL | INTRAVENOUS | 0 refills | Status: DC | PRN
Start: 2019-01-10 — End: 2019-01-15

## 2019-01-10 MED ORDER — NITROGLYCERIN 0.4 MG SL SUBL
.4 mg | SUBLINGUAL | 0 refills | Status: DC | PRN
Start: 2019-01-10 — End: 2019-01-15

## 2019-01-10 MED ORDER — AMINOPHYLLINE 500 MG/20 ML IV SOLN
50 mg | INTRAVENOUS | 0 refills | Status: DC | PRN
Start: 2019-01-10 — End: 2019-01-15

## 2019-01-10 MED ORDER — REGADENOSON 0.4 MG/5 ML IV SYRG
.4 mg | Freq: Once | INTRAVENOUS | 0 refills | Status: CP
Start: 2019-01-10 — End: ?
  Administered 2019-01-10: 17:00:00 0.4 mg via INTRAVENOUS

## 2019-01-10 MED ORDER — ALBUTEROL SULFATE 90 MCG/ACTUATION IN HFAA
2 | RESPIRATORY_TRACT | 0 refills | Status: DC | PRN
Start: 2019-01-10 — End: 2019-01-15

## 2019-01-11 ENCOUNTER — Encounter: Admit: 2019-01-11 | Discharge: 2019-01-11 | Payer: MEDICARE

## 2019-01-20 ENCOUNTER — Encounter: Admit: 2019-01-20 | Discharge: 2019-01-20 | Payer: MEDICARE

## 2019-01-20 DIAGNOSIS — I480 Paroxysmal atrial fibrillation: Secondary | ICD-10-CM

## 2019-01-27 ENCOUNTER — Encounter: Admit: 2019-01-27 | Discharge: 2019-01-27 | Payer: MEDICARE

## 2019-01-27 NOTE — Progress Notes
In range so no call placed.

## 2019-02-02 ENCOUNTER — Ambulatory Visit: Admit: 2019-02-02 | Discharge: 2019-02-02 | Payer: MEDICARE

## 2019-02-02 DIAGNOSIS — I441 Atrioventricular block, second degree: ICD-10-CM

## 2019-02-02 DIAGNOSIS — Z950 Presence of cardiac pacemaker: ICD-10-CM

## 2019-02-02 DIAGNOSIS — I442 Atrioventricular block, complete: Principal | ICD-10-CM

## 2019-02-06 ENCOUNTER — Encounter: Admit: 2019-02-06 | Discharge: 2019-02-06 | Payer: MEDICARE

## 2019-02-06 DIAGNOSIS — I480 Paroxysmal atrial fibrillation: Principal | ICD-10-CM

## 2019-02-07 ENCOUNTER — Encounter: Admit: 2019-02-07 | Discharge: 2019-02-07 | Payer: MEDICARE

## 2019-02-08 MED ORDER — METOPROLOL SUCCINATE 25 MG PO TB24
25 mg | ORAL_TABLET | Freq: Every day | ORAL | 3 refills | Status: SS
Start: 2019-02-08 — End: 2019-06-08

## 2019-02-14 ENCOUNTER — Encounter: Admit: 2019-02-14 | Discharge: 2019-02-14 | Payer: MEDICARE

## 2019-02-14 NOTE — Telephone Encounter
Received mail from [facility] for pt's hearing test & recommendations.    Routing to Dr. Enis Slipper to view.

## 2019-02-16 ENCOUNTER — Encounter: Admit: 2019-02-16 | Discharge: 2019-02-16 | Payer: MEDICARE

## 2019-02-20 ENCOUNTER — Encounter: Admit: 2019-02-20 | Discharge: 2019-02-20 | Payer: MEDICARE

## 2019-02-20 MED ORDER — TAMSULOSIN 0.4 MG PO CAP
ORAL_CAPSULE | Freq: Every day | ORAL | 1 refills | 90.00000 days | Status: AC
Start: 2019-02-20 — End: 2019-11-27

## 2019-02-21 ENCOUNTER — Encounter: Admit: 2019-02-21 | Discharge: 2019-02-21 | Payer: MEDICARE

## 2019-02-21 DIAGNOSIS — I480 Paroxysmal atrial fibrillation: Principal | ICD-10-CM

## 2019-02-28 ENCOUNTER — Encounter: Admit: 2019-02-28 | Discharge: 2019-02-28 | Payer: MEDICARE

## 2019-02-28 ENCOUNTER — Ambulatory Visit: Admit: 2019-02-28 | Discharge: 2019-03-01 | Payer: MEDICARE

## 2019-02-28 DIAGNOSIS — E785 Hyperlipidemia, unspecified: ICD-10-CM

## 2019-02-28 DIAGNOSIS — J302 Other seasonal allergic rhinitis: ICD-10-CM

## 2019-02-28 DIAGNOSIS — L570 Actinic keratosis: ICD-10-CM

## 2019-02-28 DIAGNOSIS — R51 Headache: ICD-10-CM

## 2019-02-28 DIAGNOSIS — B078 Other viral warts: ICD-10-CM

## 2019-02-28 DIAGNOSIS — I495 Sick sinus syndrome: ICD-10-CM

## 2019-02-28 DIAGNOSIS — I4891 Unspecified atrial fibrillation: ICD-10-CM

## 2019-02-28 DIAGNOSIS — L578 Other skin changes due to chronic exposure to nonionizing radiation: ICD-10-CM

## 2019-02-28 DIAGNOSIS — R001 Bradycardia, unspecified: ICD-10-CM

## 2019-02-28 DIAGNOSIS — F5221 Male erectile disorder: ICD-10-CM

## 2019-02-28 DIAGNOSIS — H9190 Unspecified hearing loss, unspecified ear: ICD-10-CM

## 2019-02-28 DIAGNOSIS — B351 Tinea unguium: ICD-10-CM

## 2019-02-28 DIAGNOSIS — K219 Gastro-esophageal reflux disease without esophagitis: ICD-10-CM

## 2019-02-28 DIAGNOSIS — E039 Hypothyroidism, unspecified: ICD-10-CM

## 2019-02-28 DIAGNOSIS — Z7901 Long term (current) use of anticoagulants: ICD-10-CM

## 2019-02-28 DIAGNOSIS — B353 Tinea pedis: ICD-10-CM

## 2019-02-28 DIAGNOSIS — Z823 Family history of stroke: ICD-10-CM

## 2019-02-28 DIAGNOSIS — I10 Essential (primary) hypertension: ICD-10-CM

## 2019-02-28 DIAGNOSIS — M1990 Unspecified osteoarthritis, unspecified site: ICD-10-CM

## 2019-02-28 DIAGNOSIS — L821 Other seborrheic keratosis: ICD-10-CM

## 2019-02-28 DIAGNOSIS — M25569 Pain in unspecified knee: ICD-10-CM

## 2019-02-28 DIAGNOSIS — R5383 Other fatigue: ICD-10-CM

## 2019-02-28 DIAGNOSIS — D489 Neoplasm of uncertain behavior, unspecified: ICD-10-CM

## 2019-02-28 DIAGNOSIS — R42 Dizziness and giddiness: ICD-10-CM

## 2019-02-28 DIAGNOSIS — L304 Erythema intertrigo: ICD-10-CM

## 2019-02-28 NOTE — Progress Notes
Who is at greatest risk?  ??? Children under the age of 87.  ??? Adults 65 and older.  ??? African Americans and Native Americans.  ??? Persons living in rural areas.  ??? Persons living in manufactured homes or substandard housing.  What can you do?  ??? Be sure your home is equipped with a functioning Smoke and Carbon Monoxide alarm.  ??? Do not smoke.  ??? Do not drink to excess.  ??? Monitor your stove, oven, and kitchen appliances.  ??? Have a functioning fire extinguisher in your kitchen.    Driving Safety  The Facts:  ??? Motor vehicle-related deaths and injuries among older adults are rising.  ??? Drivers 65 and older have higher crash death rates per mile than all but teen drivers.  ??? The 58 and older population is the fastest growing segment of the population.  ??? Older drivers who are injured in a motor vehicle accident are more likely than younger drivers to die of their injuries.  ??? Rates for motor vehicle-related injury are twice as high for older men than for older women.  What can you do?  ??? Wear you seatbelt in the car ??? all the time.  ??? Be sure that your vision and hearing have been tested and are satisfactory.  ??? Do not drink and drive.  ??? Talk with family about your driving skills and consider their advice when assessing your driving skills and safety.  ??? Do not talk on a cell phone and drive at the same time.    Suicide Risk  The Facts:  ??? Suicide rates increase with age and rates are high among those over 34.  ??? Older adults who are suicidal are also more likely to be suffering from  ??? Physical illnesses and to be divorced or widowed.  ??? Older men are more likely to commit suicide than older women.  ??? Firearms are used in the majority of suicides committed by older adults.  What can you do?  ??? Seek care if you are depressed.  ??? Seek social supports such as family, church, [employer], and friends if you are ill, divorced, or living alone. Jonathan Bellows, LPN    Physician:   I reviewed and approved orders and components above of annual wellness visit and the personalized prevention plan services for this patient.

## 2019-03-01 DIAGNOSIS — Z6831 Body mass index (BMI) 31.0-31.9, adult: ICD-10-CM

## 2019-03-01 DIAGNOSIS — Z0000 Encounter for general adult medical examination without abnormal findings: Principal | ICD-10-CM

## 2019-03-06 ENCOUNTER — Encounter: Admit: 2019-03-06 | Discharge: 2019-03-06 | Payer: MEDICARE

## 2019-03-06 DIAGNOSIS — I480 Paroxysmal atrial fibrillation: Principal | ICD-10-CM

## 2019-03-22 ENCOUNTER — Encounter: Admit: 2019-03-22 | Discharge: 2019-03-22 | Payer: MEDICARE

## 2019-04-06 ENCOUNTER — Encounter: Admit: 2019-04-06 | Discharge: 2019-04-06 | Payer: MEDICARE

## 2019-04-06 DIAGNOSIS — I480 Paroxysmal atrial fibrillation: Principal | ICD-10-CM

## 2019-04-14 ENCOUNTER — Encounter: Admit: 2019-04-14 | Discharge: 2019-04-14 | Payer: MEDICARE

## 2019-04-16 MED ORDER — LISINOPRIL 20 MG PO TAB
ORAL_TABLET | Freq: Every day | 2 refills | Status: DC
Start: 2019-04-16 — End: 2020-03-13

## 2019-04-21 ENCOUNTER — Encounter: Admit: 2019-04-21 | Discharge: 2019-04-21 | Payer: MEDICARE

## 2019-04-21 DIAGNOSIS — I480 Paroxysmal atrial fibrillation: Principal | ICD-10-CM

## 2019-04-26 ENCOUNTER — Encounter: Admit: 2019-04-26 | Discharge: 2019-04-26 | Payer: MEDICARE

## 2019-04-26 DIAGNOSIS — E039 Hypothyroidism, unspecified: ICD-10-CM

## 2019-04-26 DIAGNOSIS — I4891 Unspecified atrial fibrillation: ICD-10-CM

## 2019-04-26 DIAGNOSIS — E782 Mixed hyperlipidemia: ICD-10-CM

## 2019-04-26 DIAGNOSIS — I2510 Atherosclerotic heart disease of native coronary artery without angina pectoris: Principal | ICD-10-CM

## 2019-04-26 MED ORDER — ATORVASTATIN 40 MG PO TAB
40 mg | ORAL_TABLET | Freq: Every evening | ORAL | 1 refills | Status: DC
Start: 2019-04-26 — End: 2019-10-31

## 2019-04-26 NOTE — Telephone Encounter
Refill Request received.    Patient last seen 12/30/2018 with plan to continue Lipitor 40.    Follow up appt scheduled for 06/27/2019.    3 months and 1 refill e-scribed per protocol.    Hepatic Function    Lab Results   Component Value Date/Time    ALBUMIN 4.3 01/03/2019 08:58 AM    TOTPROT 6.6 01/03/2019 08:58 AM    ALKPHOS 40 01/03/2019 08:58 AM    Lab Results   Component Value Date/Time    AST 21 01/03/2019 08:58 AM    ALT 26 01/03/2019 08:58 AM    TOTBILI 0.9 01/03/2019 08:58 AM

## 2019-04-28 ENCOUNTER — Encounter: Admit: 2019-04-28 | Discharge: 2019-04-28 | Payer: MEDICARE

## 2019-04-28 DIAGNOSIS — I358 Other nonrheumatic aortic valve disorders: Principal | ICD-10-CM

## 2019-04-28 MED ORDER — WARFARIN 4 MG PO TAB
ORAL_TABLET | Freq: Every day | ORAL | 3 refills | 90.00000 days | Status: DC
Start: 2019-04-28 — End: 2020-04-22

## 2019-05-04 ENCOUNTER — Encounter: Admit: 2019-05-04 | Discharge: 2019-05-04 | Payer: MEDICARE

## 2019-05-04 ENCOUNTER — Ambulatory Visit: Admit: 2019-05-04 | Discharge: 2019-05-04 | Payer: MEDICARE

## 2019-05-04 DIAGNOSIS — I495 Sick sinus syndrome: ICD-10-CM

## 2019-05-04 DIAGNOSIS — Z950 Presence of cardiac pacemaker: Secondary | ICD-10-CM

## 2019-05-04 DIAGNOSIS — I442 Atrioventricular block, complete: Principal | ICD-10-CM

## 2019-05-04 DIAGNOSIS — I441 Atrioventricular block, second degree: ICD-10-CM

## 2019-05-05 ENCOUNTER — Encounter: Admit: 2019-05-05 | Discharge: 2019-05-05 | Payer: MEDICARE

## 2019-05-09 ENCOUNTER — Encounter: Admit: 2019-05-09 | Discharge: 2019-05-09 | Payer: MEDICARE

## 2019-05-09 DIAGNOSIS — I480 Paroxysmal atrial fibrillation: Principal | ICD-10-CM

## 2019-05-09 NOTE — Progress Notes
Spoke to [place], INR WTR. Re-check in one week.

## 2019-05-22 ENCOUNTER — Encounter: Admit: 2019-05-22 | Discharge: 2019-05-22

## 2019-05-22 NOTE — Progress Notes
In range so no call placed.

## 2019-06-05 ENCOUNTER — Encounter: Admit: 2019-06-05 | Discharge: 2019-06-05

## 2019-06-05 NOTE — Telephone Encounter
Pt LVM requesting call back to see if he could reschedule appt.    Spoke to pt to inform him that we had 1 opening available on 8/4 but none until 09/12/2019.  Pt would prefer to wait until Sep & agreed that if he had anything come up health wise that he would call this office.    Rescheduled pt.

## 2019-06-06 ENCOUNTER — Encounter: Admit: 2019-06-06 | Discharge: 2019-06-06

## 2019-06-06 NOTE — Progress Notes
In range, no call placed.

## 2019-06-07 ENCOUNTER — Encounter: Admit: 2019-06-07 | Discharge: 2019-06-07

## 2019-06-07 DIAGNOSIS — L570 Actinic keratosis: Secondary | ICD-10-CM

## 2019-06-07 DIAGNOSIS — I4891 Unspecified atrial fibrillation: Secondary | ICD-10-CM

## 2019-06-07 DIAGNOSIS — R001 Bradycardia, unspecified: Secondary | ICD-10-CM

## 2019-06-07 DIAGNOSIS — E782 Mixed hyperlipidemia: Secondary | ICD-10-CM

## 2019-06-07 DIAGNOSIS — E785 Hyperlipidemia, unspecified: Secondary | ICD-10-CM

## 2019-06-07 DIAGNOSIS — Z823 Family history of stroke: Secondary | ICD-10-CM

## 2019-06-07 DIAGNOSIS — H9190 Unspecified hearing loss, unspecified ear: Secondary | ICD-10-CM

## 2019-06-07 DIAGNOSIS — I472 Ventricular tachycardia: Secondary | ICD-10-CM

## 2019-06-07 DIAGNOSIS — I10 Essential (primary) hypertension: Secondary | ICD-10-CM

## 2019-06-07 DIAGNOSIS — B078 Other viral warts: Secondary | ICD-10-CM

## 2019-06-07 DIAGNOSIS — Z7901 Long term (current) use of anticoagulants: Secondary | ICD-10-CM

## 2019-06-07 DIAGNOSIS — J302 Other seasonal allergic rhinitis: Secondary | ICD-10-CM

## 2019-06-07 DIAGNOSIS — L578 Other skin changes due to chronic exposure to nonionizing radiation: Secondary | ICD-10-CM

## 2019-06-07 DIAGNOSIS — M1990 Unspecified osteoarthritis, unspecified site: Secondary | ICD-10-CM

## 2019-06-07 DIAGNOSIS — R51 Headache: Secondary | ICD-10-CM

## 2019-06-07 DIAGNOSIS — B353 Tinea pedis: Secondary | ICD-10-CM

## 2019-06-07 DIAGNOSIS — I495 Sick sinus syndrome: Secondary | ICD-10-CM

## 2019-06-07 DIAGNOSIS — D489 Neoplasm of uncertain behavior, unspecified: Secondary | ICD-10-CM

## 2019-06-07 DIAGNOSIS — L304 Erythema intertrigo: Secondary | ICD-10-CM

## 2019-06-07 DIAGNOSIS — I2510 Atherosclerotic heart disease of native coronary artery without angina pectoris: Secondary | ICD-10-CM

## 2019-06-07 DIAGNOSIS — K219 Gastro-esophageal reflux disease without esophagitis: Secondary | ICD-10-CM

## 2019-06-07 DIAGNOSIS — I480 Paroxysmal atrial fibrillation: Secondary | ICD-10-CM

## 2019-06-07 DIAGNOSIS — F5221 Male erectile disorder: Secondary | ICD-10-CM

## 2019-06-07 DIAGNOSIS — E039 Hypothyroidism, unspecified: Secondary | ICD-10-CM

## 2019-06-07 DIAGNOSIS — M25569 Pain in unspecified knee: Secondary | ICD-10-CM

## 2019-06-07 DIAGNOSIS — B351 Tinea unguium: Secondary | ICD-10-CM

## 2019-06-07 DIAGNOSIS — R5383 Other fatigue: Secondary | ICD-10-CM

## 2019-06-07 DIAGNOSIS — R42 Dizziness and giddiness: Secondary | ICD-10-CM

## 2019-06-07 DIAGNOSIS — L821 Other seborrheic keratosis: Secondary | ICD-10-CM

## 2019-06-07 LAB — COMPREHENSIVE METABOLIC PANEL
Lab: 0.6 mg/dL (ref 0.4–1.24)
Lab: 11 10*3/uL (ref 3–12)
Lab: 139 MMOL/L (ref 137–147)
Lab: 16 mg/dL (ref 7–25)
Lab: 21 MMOL/L (ref 21–30)
Lab: 35 U/L (ref 7–56)
Lab: 42 U/L (ref 25–110)
Lab: 6.7 g/dL (ref 6.0–8.0)
Lab: >60 mL/min (ref >60)
Lab: >60 mL/min (ref >60)

## 2019-06-07 LAB — COVID-19 (SARS-COV-2) PCR

## 2019-06-07 LAB — BNP (B-TYPE NATRIURETIC PEPTI): Lab: 62 pg/mL — ABNORMAL HIGH (ref 0–100)

## 2019-06-07 LAB — MAGNESIUM: Lab: 2.1 mg/dL — ABNORMAL HIGH (ref 1.6–2.6)

## 2019-06-07 LAB — TROPONIN-I
Lab: 0 ng/mL (ref 0.0–0.05)
Lab: 0 ng/mL (ref 0.0–0.05)

## 2019-06-07 LAB — TSH WITH FREE T4 REFLEX: Lab: 1 uU/mL (ref 0.35–5.00)

## 2019-06-07 LAB — CBC AND DIFF: Lab: 6.5 10*3/uL (ref 4.5–11.0)

## 2019-06-07 LAB — PTT (APTT): Lab: 41 s — ABNORMAL HIGH (ref 24.0–36.5)

## 2019-06-07 LAB — PROTIME INR (PT): Lab: 2.9 M/UL — ABNORMAL HIGH (ref 0.8–1.2)

## 2019-06-07 LAB — PHOSPHORUS: Lab: 2.7 mg/dL (ref 2.0–4.5)

## 2019-06-07 MED ORDER — METOPROLOL SUCCINATE 25 MG PO TB24
25 mg | Freq: Every day | ORAL | 0 refills | Status: DC
Start: 2019-06-07 — End: 2019-06-07
  Administered 2019-06-07: 21:00:00 25 mg via ORAL

## 2019-06-07 MED ORDER — ASPIRIN 81 MG PO TBEC
81 mg | Freq: Every day | ORAL | 0 refills | Status: DC
Start: 2019-06-07 — End: 2019-06-08

## 2019-06-07 MED ORDER — TAMSULOSIN 0.4 MG PO CAP
.4 mg | Freq: Every day | ORAL | 0 refills | Status: DC
Start: 2019-06-07 — End: 2019-06-08
  Administered 2019-06-08: 14:00:00 0.4 mg via ORAL

## 2019-06-07 MED ORDER — EZETIMIBE 10 MG PO TAB
10 mg | Freq: Every day | ORAL | 0 refills | Status: DC
Start: 2019-06-07 — End: 2019-06-08
  Administered 2019-06-08: 14:00:00 10 mg via ORAL

## 2019-06-07 MED ORDER — LISINOPRIL 20 MG PO TAB
20 mg | Freq: Every day | ORAL | 0 refills | Status: DC
Start: 2019-06-07 — End: 2019-06-08
  Administered 2019-06-08: 14:00:00 20 mg via ORAL

## 2019-06-07 MED ORDER — METOPROLOL SUCCINATE 25 MG PO TB24
25 mg | Freq: Two times a day (BID) | ORAL | 0 refills | Status: DC
Start: 2019-06-07 — End: 2019-06-08
  Administered 2019-06-08: 14:00:00 25 mg via ORAL

## 2019-06-07 MED ORDER — ASPIRIN 81 MG PO TBEC
81 mg | Freq: Every day | ORAL | 0 refills | Status: DC
Start: 2019-06-07 — End: 2019-06-08
  Administered 2019-06-08: 03:00:00 81 mg via ORAL

## 2019-06-07 MED ORDER — PERFLUTREN LIPID MICROSPHERES 1.1 MG/ML IV SUSP
1-20 mL | Freq: Once | INTRAVENOUS | 0 refills | Status: CP | PRN
Start: 2019-06-07 — End: ?
  Administered 2019-06-07: 21:00:00 2 mL via INTRAVENOUS

## 2019-06-07 MED ORDER — ATORVASTATIN 40 MG PO TAB
40 mg | Freq: Every evening | ORAL | 0 refills | Status: DC
Start: 2019-06-07 — End: 2019-06-08
  Administered 2019-06-08: 02:00:00 40 mg via ORAL

## 2019-06-07 MED ORDER — EZETIMIBE 10 MG PO TAB
ORAL_TABLET | Freq: Every day | 0 refills
Start: 2019-06-07 — End: ?

## 2019-06-07 MED ORDER — LEVOTHYROXINE 25 MCG PO TAB
50 ug | Freq: Every day | ORAL | 0 refills | Status: DC
Start: 2019-06-07 — End: 2019-06-08
  Administered 2019-06-08: 11:00:00 50 ug via ORAL

## 2019-06-07 MED ORDER — PANTOPRAZOLE 40 MG PO TBEC
40 mg | Freq: Every day | ORAL | 0 refills | Status: DC
Start: 2019-06-07 — End: 2019-06-08
  Administered 2019-06-08: 02:00:00 40 mg via ORAL

## 2019-06-07 NOTE — H&P (View-Only)
Admission History and Physical Examination      Name:  Jonathan Humphrey                                             MRN:  [id number]   Admission Date:  (Not on file)                     Assessment/Plan:    Active Problems:    Coronary artery disease, non-occlusive    Hyperlipemia    Hypertension    GERD (gastroesophageal reflux disease)    Hypothyroid    Atrial fibrillation (HCC)    Aortic valve mass    Cardiac pacemaker in situ    85 y.o male with a history of Paroxysmal Atrial Fibrillation OAC with Warfarin s/p PVI ablation and resection of LAA, CAD/PCI 1995, Chronotropic incompetence s/p PPM 2016, a fibro-elastoma on the underside of the right coronary cusp of the aortic valve s/p resection in May 2011, hypertension, dyslipidemia, hypothyroidism and GERD who presents as a direct admission for sustained VT      Sustained VT  -Likely secondary to missed doses of metoprolol, low suspicion of ischemic etiology with cardiac work up below  [employer] with 33 second run of VT  -See cardiac work up below  -EKG with t wave inversions in inferior leads  Plan  -Ordered K, Mag, Phos, TSH/FT4  -Chest xray  -Ordered echo  -PM interrogation  -Consulted EP, appreciate recs regarding antiarrhythmics/defibrillator changes  -Restart pta metoprolol 25mg  XL  -Cardiac CT to further risk stratify CAD    Hx NSVT  Paroxysmal Atrial Fibrillation   s/p PVI ablation  S/p resection of LAA  Chronotropic incompetence  S/p PPM 2016  -PTA warfarin, metoprolol XL 25mg  QD  Plan:  -Restart pta metoprolol  -Hold warfarin for now in case of procedure, daily INR (goal 2-3)    CAD  S/p angioplasty 1995  HTN  HL  -12/2018 Echo EF 60-65%  -12/2018 Stress test normal  - 2018 LHC mild CAD  - 01/2018 No evidence of delayed myocardial enhancement to suggest scar or infiltrating myocardial disease.  Plan:  -Continue pta ASA, atorvastatin 40mg  QD, zetia 10mg  QD, lisinopril 20mg , metoprolol XL 25mg  QD Fibroelastoma of the aortic valve s/p resection 2011    FEN: No IVF, replace lytes prn, cardiac diet, NPO at MN  DVT ppx: holding in case of procedure  Code status: full code  Dispo: admit to CV, CICU status      __________________________________________________________________________________  Primary Care Physician: Lelan Pons     Chief Complaint:  Sustained VT  History of Present Illness: Jonathan Humphrey is a 85 y.o. male with hx above presents after being notified about VT for 33 seconds on 06/05/19. Patient denies any symptoms during this time. Denies any chest pain, shortness of breath, recent lightheadedness, syncope. No fever, chills or cough. States he has not taken metoprolol for the past 8 days due to concern of sexual side effects.    Medical History:   Diagnosis Date   ??? AK (actinic keratosis)     scalp   ??? Arthritis    ??? Atrial fibrillation (HCC) 01/16/2009   ??? Chronic anticoagulation 01/16/2009   ??? Coronary atherosclerosis 09/01/2006    Coronary artery disease   ??? Cyst     right upper back   ???  Dizziness 01/19/2007   ??? 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 Family history of cerebrovascular accident (CVA) 01/19/2007   ??? Fatigue 07/21/2007   ??? Generalized headaches    ??? GERD (gastroesophageal reflux disease) 12/30/2006   ??? Hearing loss 01/19/2007   ??? Hyperlipidemia 09/01/2006   ??? Hypertension, essential 09/01/2006   ??? Hypothyroidism 11/30/2007   ??? Intertrigo    ??? Knee pain 12/30/2006   ??? Neoplasm of uncertain behavior    ??? Onychomycosis    ??? Photoaging of skin    ??? Seasonal allergic reaction    ??? Sinus bradycardia    ??? SK (seborrheic keratosis)     face, scalp, right ear   ??? SSS (sick sinus syndrome) (HCC)    ??? Tinea pedis    ??? Verruca vulgaris      Surgical History:   Procedure Laterality Date   ??? LEFT HEART CATHETERIZATION  05/1994    no stents   ??? CORONARY ANGIOPLASTY  05/1994    [facility], no stents   ??? SKIN BIOPSY  09/24/2006    shave biopsy ??? CARDIOVERSION  03/11/2009   ??? 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 KNEE REPLACEMENT Right 10/09/14   ??? Insert Permanent Pacemaker and Atrial and Ventricular Leads Left 10/21/2015    Performed by Smith Robert, MD at [facility] EP LAB   ??? Fluoroscopy N/A 10/21/2015    Performed by Smith Robert, MD at [facility] EP LAB   ??? REPAIR HERNIA UMBILICAL N/A 01/13/2016    Performed by Simonne Martinet, MD at [facility] OR   ??? REPAIR HERNIA INGUINAL Bilateral 01/13/2016    Performed by Simonne Martinet, MD at [facility] OR   ??? SKIN LESION REMOVAL  2018    3 spots on face by outside facility   ??? ANGIOGRAPHY CORONARY ARTERY N/A 05/10/2017    Performed by Greig Castilla, MD at [facility] CATH LAB   ??? POSSIBLE PERCUTANEOUS CORONARY STENT PLACEMENT WITH ANGIOPLASTY N/A 05/10/2017    Performed by Greig Castilla, MD at [facility] CATH LAB     Family history reviewed; non-contributory  Social History     Socioeconomic History   ??? Marital status: Married     Spouse name: Not on file   ??? Number of children: Not on file   ??? Years of education: Not on file   ??? Highest education level: Not on file   Occupational History   ??? Not on file   Social Needs   ??? Financial resource strain: Not on file   ??? Food insecurity     Worry: Not on file     Inability: Not on file   ??? Transportation needs     Medical: Not on file     Non-medical: Not on file   Tobacco Use   ??? Smoking status: Never Smoker   ??? Smokeless tobacco: Never Used   Substance and Sexual Activity   ??? Alcohol use: Yes     Comment: 2-3 ounces daily   ??? Drug use: No   ??? Sexual activity: Not on file   Lifestyle   ??? Physical activity     Days per week: Not on file     Minutes per session: Not on file   ??? Stress: Not on file   Relationships   ??? Social Wellsite geologist on phone: Not on file     Gets together: Not on  file     Attends religious service: Not on file     Active member of club or organization: Not on file     Attends meetings of clubs or organizations: Not on file     Relationship status: Not on file ??? Intimate partner violence     Fear of current or ex partner: Not on file     Emotionally abused: Not on file     Physically abused: Not on file     Forced sexual activity: Not on file   Other Topics Concern   ??? Not on file   Social History Narrative   ??? Not on file            Immunizations (includes history and patient reported):   Immunization History   Administered Date(s) Administered   ??? Flu Vaccine =>65 YO High-Dose (PF) 12/09/2016, 01/04/2018, 11/14/2018   ??? Flu Vaccine Trivalent >64 Yo High-dose (Preservative Free) 10/04/2013, 10/31/2014, 12/06/2015   ??? Pneumococcal Vaccine (23-Val Adult) 10/12/2013   ??? Pneumococcal Vaccine(13-Val Peds/immunocompromised adult) 08/07/2015           Allergies:  Patient has no known allergies.    Medications:  No current facility-administered medications for this encounter.      Current Outpatient Medications   Medication Sig   ??? aspirin EC 81 mg tablet Take 1 tablet by mouth daily. Take with food.   ??? atorvastatin (LIPITOR) 40 mg tablet Take one tablet by mouth at bedtime daily.   ??? cholecalciferol (VITAMIN D-3) 1,000 units tablet Take 1,500 Units by mouth daily.   ??? ezetimibe (ZETIA) 10 mg tablet Take one tablet by mouth daily.   ??? ketoconazole (NIZORAL) 2 % topical cream Apply  to affected area twice daily. to scaly areas on forehead and nose   ??? levothyroxine (SYNTHROID) 50 mcg tablet TAKE (1) TABLET BY MOUTH ONCE DAILY   ??? lisinopriL (ZESTRIL) 20 mg tablet TAKE (1) TABLET BY MOUTH ONCE DAILY   ??? metoprolol XL (TOPROL XL) 25 mg extended release tablet Take one tablet by mouth daily.   ??? OMEGA-3 FATTY ACIDS/FISH OIL (FISH OIL-OMEGA-3 FATTY ACIDS PO) Take  by mouth daily. 2 tsp daily     ??? omeprazole DR(+) (PRILOSEC) 20 mg capsule Take one capsule by mouth daily.   ??? tamsulosin (FLOMAX) 0.4 mg capsule TAKE 2 CAPSULES 30 MINUTES FOLLOWING THE SAME MEAL DAILY. DO NOT CRUSH, CHEW OR OPEN CAPSULES.   ??? warfarin (COUMADIN) 4 mg tablet TAKE 1 TABLET DAILY OR AS INSTRUCTED BY DR. Vivianne Spence     Review of Systems:  A 14 point review of systems was negative except for:    Physical Exam:  Vital Signs: Last Filed In 24 Hours Vital Signs: 24 Hour Range     BP: ()/()   ABP: ()/()           General:  Alert, cooperative, no distress, appears stated age  Head:  Normocephalic,  atraumatic  Eyes:  EOMs intact.    Ears:  Normal TMs and external ear canals, both ears  Nose:  Nares normal.   Lungs:  Clear to auscultation bilaterally  Chest wall:  No tenderness or deformity.  Heart:    Distant cardiac sounds, Regular rate and rhythm, S1, S2 normal, no murmur  Abdomen:  Soft, non-tender.  Bowel sounds normal.  No masses.    Extremities:  Extremities normal, trace edema  Neurologic:  No obvious focal deficits  Musculoskeletal:  Normal / Negative  Psych:  Normal  Lab/Radiology/Other Diagnostic Tests:  24-hour labs:  No results found for this visit on 06/07/19 (from the past 24 hour(s)).     Pertinent radiology reviewed.    Otilio Carpen, MBBS  Pager [phone]

## 2019-06-07 NOTE — Consults
Electrophysiology Consult Note:    Admission Date: 06/07/2019  Date of Consultation:  06/07/2019  LOS: 0 days  Requesting Physician: Lessie Dings, MD  Consulting Physician:  Dr. Marya Amsler   Code Status: Full Code    Reason for Consultation  Opinion and recommendations regarding VT on device interrogation     Assessment:  NSVT  -15-second episode of NSVT noted on device interrogation  -Patient has well-documented history of NSVT  -PTA Toprol was self discontinued    Chronotropic incompetence s/p PPM 2016    CAD status post angioplasty 1995  -Coronary angiography in 2018 with mild atherosclerosis  -Cardiac CT to further risk stratify CAD is pending    Echocardiogram obtained 6/17 with LVEF 65% and no hemodynamically significant valvular disease    Paroxysmal atrial fibrillation   - status post PVI  - hx left atrial appendage resection  - OAC with warfarin    Recommendations:  EP was consulted for recommendations for management of NSVT noted on device interrogation.  He has a history of NSVT and has undergone comprehensive work-up which is negative for significant coronary artery disease and late gadolinium enhancement on MRI.  He has been on Toprol 25 mg daily, but stopped taking it due to a loss of interest in his hobbies.  He read that this could be a side effect of beta-blocker.  He did not notice any improvement after stopping Toprol.    Recommend resuming PTA Toprol at an increased dose of 25 mg twice daily.  We will defer addition of membrane active drug for now.  We would like him to follow-up with Dr. Wallene Huh within the month.    Thank you for the consult.  EP will sign off at this time.  Please reach out with any additional questions or concerns.    Seth Bake, APRN-C (personal pgr [phone])  [facility] (team pgr [phone])    Medicare attestation  ________________________________________________________________  History of Present Illness: Jonathan Humphrey is a 85 y.o. 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  Also he has history of angioplasty in 1995, chronotropic incompetence with a permanent pacemaker in place, hypertension, dyslipidemia, hypothyroidism and GERD.  He had nonsustained VT in 2018 and underwent coronary angiography which showed only mild coronary atherosclerosis with no obstructive coronary disease and no need for intervention. He subsequently underwent a cardiac MRI that showed a normal ejection fraction and no late gadolinium enhancement. He had recurrent nonsustained VT on November 21, 2017, he was subsequently started on metoprolol extended release 25 mg daily.        Past Medical History:  Medical History:   Diagnosis Date   ??? AK (actinic keratosis)     scalp   ??? Arthritis    ??? Atrial fibrillation (HCC) 01/16/2009   ??? Chronic anticoagulation 01/16/2009   ??? Coronary atherosclerosis 09/01/2006    Coronary artery disease   ??? Cyst     right upper back   ??? Dizziness 01/19/2007   ??? 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 Essential hypertension 04/11/2009   ??? Family history of cerebrovascular accident (CVA) 01/19/2007   ??? Fatigue 07/21/2007   ??? Generalized headaches    ??? GERD (gastroesophageal reflux disease) 12/30/2006   ??? Hearing loss 01/19/2007   ???  HLD (hyperlipidemia) 04/11/2009   ??? Hyperlipidemia 09/01/2006   ??? Hypertension, essential 09/01/2006   ??? Hypothyroidism 11/30/2007   ??? Intertrigo    ??? Knee pain 12/30/2006   ??? Neoplasm of uncertain behavior    ??? Onychomycosis    ??? PAF (paroxysmal atrial fibrillation) (HCC) 04/11/2009   ??? Photoaging of skin    ??? Seasonal allergic reaction    ??? Sinus bradycardia    ??? SK (seborrheic keratosis)     face, scalp, right ear ??? SSS (sick sinus syndrome) (HCC)    ??? Tinea pedis    ??? Verruca vulgaris          Social History:  Social History     Socioeconomic History   ??? Marital status: Married     Spouse name: Not on file   ??? Number of children: Not on file   ??? Years of education: Not on file   ??? Highest education level: Not on file   Occupational History   ??? Not on file   Tobacco Use   ??? Smoking status: Never Smoker   ??? Smokeless tobacco: Never Used   Substance and Sexual Activity   ??? Alcohol use: Yes     Comment: 2-3 ounces daily   ??? Drug use: No   ??? Sexual activity: Not on file   Other Topics Concern   ??? Not on file   Social History Narrative   ??? Not on file       Surgical History:  Surgical History:   Procedure Laterality Date   ??? LEFT HEART CATHETERIZATION  05/1994    no stents   ??? CORONARY ANGIOPLASTY  05/1994    [facility], no stents   ??? SKIN BIOPSY  09/24/2006    shave biopsy   ??? CARDIOVERSION  03/11/2009   ??? 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 KNEE REPLACEMENT Right 10/09/14   ??? Insert Permanent Pacemaker and Atrial and Ventricular Leads Left 10/21/2015    Performed by Smith Robert, MD at [facility] EP LAB   ??? Fluoroscopy N/A 10/21/2015    Performed by Smith Robert, MD at [facility] EP LAB   ??? REPAIR HERNIA UMBILICAL N/A 01/13/2016    Performed by Simonne Martinet, MD at [facility] OR   ??? REPAIR HERNIA INGUINAL Bilateral 01/13/2016    Performed by Simonne Martinet, MD at [facility] OR   ??? SKIN LESION REMOVAL  2018    3 spots on face by outside facility   ??? ANGIOGRAPHY CORONARY ARTERY N/A 05/10/2017    Performed by Greig Castilla, MD at [facility] CATH LAB   ??? POSSIBLE PERCUTANEOUS CORONARY STENT PLACEMENT WITH ANGIOPLASTY N/A 05/10/2017    Performed by Greig Castilla, MD at [facility] CATH LAB       Family History:  Family History   Problem Relation Age of Onset   ??? High Cholesterol Mother    ??? Hypertension Mother    ??? Stroke Father    ??? Cancer Father        Medications:  aspirin EC tablet 81 mg, 81 mg, Oral, QDAY atorvastatin (LIPITOR) tablet 40 mg, 40 mg, Oral, QHS  ezetimibe (ZETIA) tablet 10 mg, 10 mg, Oral, QDAY  [START ON 06/08/2019] levothyroxine (SYNTHROID) tablet 50 mcg, 50 mcg, Oral, QDAY(07)  lisinopriL (ZESTRIL) tablet 20 mg, 20 mg, Oral, QDAY  metoprolol XL (TOPROL XL) tablet 25 mg, 25 mg, Oral, QDAY  pantoprazole DR (PROTONIX) tablet 40 mg,  40 mg, Oral, QDAY(21)  tamsulosin (FLOMAX) capsule 0.4 mg, 0.4 mg, Oral, QDAY after breakfast          Allergies:  No Known Allergies    Review of Systems:  Const: denies recent fever, chills, or weight loss  Eyes:  denies changes in vision  Ears/Nose/Throat:  denies congestion or rhinorrhea  Cardiovascular:  denies chest pain or palpitations  Respiratory: denies dyspnea, orthopnea, PND or productive cough    Gastrointestinal:  denies N/V/D  Genitourinary:  denies dysuria or hematuria  Musculoskeletal:  denies muscle aches or pains  Skin: denies known rashes, lesions or sores  Neurologic:  denies dizziness, lightheadedness, syncope, or near-syncope  Endocrine:  denies polyuria or polydypsia  Heme: denies recent melena or hematochezia                           Vital Signs: Most Recent                 Vital Signs: 24 Hour Range   BP: 138/87 (06/17 1339)  Temp: 36.2 ???C (97.2 ???F) (06/17 1339)  Pulse: 75 (06/17 1339)  Respirations: 16 PER MINUTE (06/17 1339)  SpO2: 98 % (06/17 1339)  Height: 172.7 cm (5' 8) (06/17 1539) BP: (138)/(87)   Temp:  [36.2 ???C (97.2 ???F)]   Pulse:  [75]   Respirations:  [16 PER MINUTE]   SpO2:  [98 %]      Vitals:    06/07/19 1339 06/07/19 1539   Weight: 94.6 kg (208 lb 9.6 oz) 94.8 kg (209 lb)     No intake or output data in the 24 hours ending 06/07/19 1757        Physical Exam:  Limited exam performed due to current COVID-19 outbreak  General Appearance: well developed, well nourished, appears stated age, no acute distress   Pulmonary: non-labored breathing  Neurologic Exam: neurological assessment grossly intact      Labs:  Hematology:    Lab Results Component Value Date    HGB 16.3 06/07/2019    HCT 46.8 06/07/2019    PLTCT 182 06/07/2019    WBC 6.5 06/07/2019    NEUT 69 06/07/2019    ANC 4.51 06/07/2019    ALC 1.20 06/07/2019    MONA 8 06/07/2019    AMC 0.51 06/07/2019    ABC 0.06 06/07/2019    MCV 100.8 06/07/2019    MCHC 34.7 06/07/2019    MPV 8.0 06/07/2019    RDW 13.7 06/07/2019   , Coagulation:    Lab Results   Component Value Date    PT 11.6 08/31/2005    PTT 41.3 06/07/2019    INR 2.9 06/07/2019    INR 2.6 06/06/2019    and General Chemistry:    Lab Results   Component Value Date    NA 139 06/07/2019    K 4.1 06/07/2019    CL 107 06/07/2019    GAP 11 06/07/2019    BUN 16 06/07/2019    CR 0.68 06/07/2019    GLU 91 06/07/2019    GLU 94 01/06/2006    CA 9.5 06/07/2019    ALBUMIN 4.4 06/07/2019    MG 2.1 06/07/2019    TOTBILI 1.1 06/07/2019       ECG:  ECG shows sinus rhythm with ventricular paced complexes, rate 72      Telemetry:  Sinus rhythm with V pacing with rates in the 70s      Windell Moulding  Alie Moudy, APRN-C [phone])

## 2019-06-07 NOTE — Progress Notes
Patient arrived to room # [phone] via wheelchair accompanied by RN. Patient transferred to the bed without assistance. Bedside safety checks completed. Initial patient assessment completed. Refer to flowsheet for details.    Admission skin assessment completed with: Evelina Dun RN    Pressure injury present on arrival?: No    1. Head/Face/Neck: No  2. Trunk/Back: No  3. Upper Extremities: No  4. Lower Extremities: No  5. Pelvic/Coccyx: No  6. Assessed for device associated injury? Yes  7. Malnutrition Screening Tool (Nursing Nutrition Assessment) Completed? Yes    See Doc Flowsheet for additional wound details.     INTERVENTIONS: n/a

## 2019-06-07 NOTE — Telephone Encounter
-----   Message from Lillie Fragmin, RN sent at 06/06/2019 10:25 AM CDT -----  Regarding: VT for 33 sec in PPM on 6.15.20  One previous VT noted as well as a few shorter NSVT.    See remote

## 2019-06-07 NOTE — Telephone Encounter
I spoke to Dr Trudee Kuster - he would like patient to be admitted for evaluation today with CV1. I reviewed PPM device results with device team and confirmed VT for 25-33 seconds. I spoke to patient and he is agreeable. He will come to the heart center admitting desk for check in. He lives 1.5 hours away. I let him know he needs to bring an overnight bag at least. Patient agreed. I spoke to wife and let her know visitation rules. Advised them both to wear a mask. I confirmed with [facility] bed placement and they will get bed assignment for patient. Paged Dr Pablo Ledger for Dr Trudee Kuster for physician to physician report.     On device: Jun 05, 2019 04:29 VT (V>A) at 182 bpm

## 2019-06-08 ENCOUNTER — Encounter: Admit: 2019-06-07 | Discharge: 2019-06-07

## 2019-06-08 ENCOUNTER — Encounter: Admit: 2019-06-08 | Discharge: 2019-06-08

## 2019-06-08 ENCOUNTER — Encounter: Admit: 2019-06-08 | Discharge: 2019-06-09

## 2019-06-08 ENCOUNTER — Encounter: Admit: 2019-06-07 | Discharge: 2019-06-08 | Disposition: A | Discharge status: Home / Self Care

## 2019-06-08 DIAGNOSIS — K219 Gastro-esophageal reflux disease without esophagitis: Secondary | ICD-10-CM

## 2019-06-08 DIAGNOSIS — L570 Actinic keratosis: Secondary | ICD-10-CM

## 2019-06-08 DIAGNOSIS — I472 Ventricular tachycardia: Principal | ICD-10-CM

## 2019-06-08 DIAGNOSIS — I480 Paroxysmal atrial fibrillation: Secondary | ICD-10-CM

## 2019-06-08 DIAGNOSIS — I10 Essential (primary) hypertension: Secondary | ICD-10-CM

## 2019-06-08 DIAGNOSIS — Z7901 Long term (current) use of anticoagulants: Secondary | ICD-10-CM

## 2019-06-08 DIAGNOSIS — Z7982 Long term (current) use of aspirin: Secondary | ICD-10-CM

## 2019-06-08 DIAGNOSIS — R42 Dizziness and giddiness: Secondary | ICD-10-CM

## 2019-06-08 DIAGNOSIS — Z950 Presence of cardiac pacemaker: Secondary | ICD-10-CM

## 2019-06-08 DIAGNOSIS — Z823 Family history of stroke: Secondary | ICD-10-CM

## 2019-06-08 DIAGNOSIS — I2510 Atherosclerotic heart disease of native coronary artery without angina pectoris: Secondary | ICD-10-CM

## 2019-06-08 DIAGNOSIS — E039 Hypothyroidism, unspecified: Secondary | ICD-10-CM

## 2019-06-08 DIAGNOSIS — Z96651 Presence of right artificial knee joint: Secondary | ICD-10-CM

## 2019-06-08 DIAGNOSIS — Z9861 Coronary angioplasty status: Secondary | ICD-10-CM

## 2019-06-08 DIAGNOSIS — L304 Erythema intertrigo: Secondary | ICD-10-CM

## 2019-06-08 DIAGNOSIS — B078 Other viral warts: Secondary | ICD-10-CM

## 2019-06-08 DIAGNOSIS — Z79899 Other long term (current) drug therapy: Secondary | ICD-10-CM

## 2019-06-08 DIAGNOSIS — I495 Sick sinus syndrome: Secondary | ICD-10-CM

## 2019-06-08 DIAGNOSIS — E785 Hyperlipidemia, unspecified: Secondary | ICD-10-CM

## 2019-06-08 DIAGNOSIS — Z1159 Encounter for screening for other viral diseases: Secondary | ICD-10-CM

## 2019-06-08 DIAGNOSIS — H9190 Unspecified hearing loss, unspecified ear: Secondary | ICD-10-CM

## 2019-06-08 DIAGNOSIS — R001 Bradycardia, unspecified: Secondary | ICD-10-CM

## 2019-06-08 DIAGNOSIS — D489 Neoplasm of uncertain behavior, unspecified: Secondary | ICD-10-CM

## 2019-06-08 DIAGNOSIS — M1990 Unspecified osteoarthritis, unspecified site: Secondary | ICD-10-CM

## 2019-06-08 DIAGNOSIS — L578 Other skin changes due to chronic exposure to nonionizing radiation: Secondary | ICD-10-CM

## 2019-06-08 DIAGNOSIS — L821 Other seborrheic keratosis: Secondary | ICD-10-CM

## 2019-06-08 DIAGNOSIS — Z79890 Hormone replacement therapy (postmenopausal): Secondary | ICD-10-CM

## 2019-06-08 DIAGNOSIS — I4589 Other specified conduction disorders: Secondary | ICD-10-CM

## 2019-06-08 DIAGNOSIS — J302 Other seasonal allergic rhinitis: Secondary | ICD-10-CM

## 2019-06-08 DIAGNOSIS — B351 Tinea unguium: Secondary | ICD-10-CM

## 2019-06-08 DIAGNOSIS — B353 Tinea pedis: Secondary | ICD-10-CM

## 2019-06-08 DIAGNOSIS — F5221 Male erectile disorder: Secondary | ICD-10-CM

## 2019-06-08 DIAGNOSIS — R51 Headache: Secondary | ICD-10-CM

## 2019-06-08 DIAGNOSIS — R5383 Other fatigue: Secondary | ICD-10-CM

## 2019-06-08 DIAGNOSIS — M25569 Pain in unspecified knee: Secondary | ICD-10-CM

## 2019-06-08 DIAGNOSIS — I4891 Unspecified atrial fibrillation: Secondary | ICD-10-CM

## 2019-06-08 LAB — PROTIME INR (PT): Lab: 2.6 FL — ABNORMAL HIGH (ref 0.8–1.2)

## 2019-06-08 LAB — BASIC METABOLIC PANEL
Lab: 0.6 mg/dL (ref 0.4–1.24)
Lab: 103 mg/dL — ABNORMAL HIGH (ref 70–100)
Lab: 106 MMOL/L (ref 98–110)
Lab: 138 MMOL/L (ref 137–147)
Lab: 15 mg/dL (ref 7–25)
Lab: 20 MMOL/L — ABNORMAL LOW (ref 21–30)
Lab: 4 MMOL/L (ref 3.5–5.1)
Lab: 8.7 mg/dL (ref 8.5–10.6)
Lab: >60 mL/min (ref >60)
Lab: >60 mL/min (ref >60)
Lab: 12 (ref 3–12)

## 2019-06-08 LAB — LIPID PROFILE: Lab: 111 mg/dL — ABNORMAL LOW (ref <200)

## 2019-06-08 LAB — MAGNESIUM: Lab: 2.1 mg/dL — ABNORMAL LOW (ref >60)

## 2019-06-08 LAB — CBC AND DIFF
Lab: 34 pg — ABNORMAL HIGH (ref 26–34)
Lab: 6.4 K/UL (ref 4.5–11.0)

## 2019-06-08 MED ORDER — IOPAMIDOL 76 % IV SOLN
100 mL | Freq: Once | INTRAVENOUS | 0 refills | Status: CP
Start: 2019-06-08 — End: ?
  Administered 2019-06-08: 14:00:00 100 mL via INTRAVENOUS

## 2019-06-08 MED ORDER — METHYLPREDNISOLONE SOD SUC(PF) 125 MG/2 ML IJ SOLR
125 mg | Freq: Once | INTRAVENOUS | 0 refills | Status: DC | PRN
Start: 2019-06-08 — End: 2019-06-08

## 2019-06-08 MED ORDER — DIPHENHYDRAMINE HCL 50 MG/ML IJ SOLN
50 mg | Freq: Once | INTRAVENOUS | 0 refills | Status: DC | PRN
Start: 2019-06-08 — End: 2019-06-08

## 2019-06-08 MED ORDER — WARFARIN 4 MG PO TAB
4 mg | Freq: Every day | ORAL | 0 refills | Status: DC
Start: 2019-06-08 — End: 2019-06-08

## 2019-06-08 MED ORDER — IVABRADINE 7.5 MG PO TAB
15 mg | Freq: Once | ORAL | 0 refills | Status: DC | PRN
Start: 2019-06-08 — End: 2019-06-08

## 2019-06-08 MED ORDER — METOPROLOL TARTRATE 5 MG/5 ML IV SOLN
5 mg | INTRAVENOUS | 0 refills | Status: DC | PRN
Start: 2019-06-08 — End: 2019-06-08

## 2019-06-08 MED ORDER — SODIUM CHLORIDE 0.9 % IV SOLP
250 mL | INTRAVENOUS | 0 refills | Status: DC | PRN
Start: 2019-06-08 — End: 2019-06-08

## 2019-06-08 MED ORDER — SODIUM CHLORIDE 0.9 % IV SOLP
250 mL | INTRAVENOUS | 0 refills | Status: DC
Start: 2019-06-08 — End: 2019-06-08

## 2019-06-08 MED ORDER — NITROGLYCERIN 400 MCG/SPRAY TL SPRY
1-2 | 0 refills | Status: DC | PRN
Start: 2019-06-08 — End: 2019-06-08
  Administered 2019-06-08: 14:00:00 1

## 2019-06-08 MED ORDER — SODIUM CHLORIDE 0.9 % IJ SOLN
100 mL | Freq: Once | INTRAVENOUS | 0 refills | Status: CP
Start: 2019-06-08 — End: ?
  Administered 2019-06-08: 14:00:00 100 mL via INTRAVENOUS

## 2019-06-08 MED ORDER — DIPHENHYDRAMINE HCL 50 MG PO CAP
50 mg | Freq: Once | ORAL | 0 refills | Status: DC | PRN
Start: 2019-06-08 — End: 2019-06-08

## 2019-06-08 MED ORDER — METOPROLOL SUCCINATE 25 MG PO TB24
25 mg | ORAL_TABLET | Freq: Two times a day (BID) | ORAL | 3 refills | 90.00000 days | Status: DC
Start: 2019-06-08 — End: 2019-06-09

## 2019-06-08 NOTE — Progress Notes
General Progress Note    Name:  Jonathan Humphrey   Today's Date:  06/08/2019  Admission Date: 06/07/2019  LOS: 1 day                     Assessment/Plan:    Principal Problem:    Paroxysmal ventricular tachycardia (HCC)  Active Problems:    Coronary artery disease, non-occlusive    HLD (hyperlipidemia)    Essential hypertension    GERD (gastroesophageal reflux disease)    Hypothyroid    PAF (paroxysmal atrial fibrillation) (HCC)    Chronic anticoagulation, on warfarin    Cardiac pacemaker in situ    85 y.o male with a history of Paroxysmal???Atrial Fibrillation???OAC with Warfarin s/p PVI ablation and resection of LAA, CAD/PCI 1995, Chronotropic incompetence s/p PPM 2016,???a 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  ???  Nonsustained VT  -Likely secondary to missed doses of metoprolol, low suspicion of ischemic etiology with cardiac work up below  -Presents after device interrogation revealed 15 second run of VT on 6/15  -See cardiac work up below  -EKG with t wave inversions in inferior leads  -K 4.7, Mag 2.1, TSH WNL  -CXR without acute process  -Echo EF 65%  -Cardiac CT with Extensive coronary artery calcifications. The left anterior descending artery with extensive coronary artery calcification in the proximal to mid segments severe stenosis cannot be excluded. The left anterior descending artery is patent in the mid to distal segments and not well-visualized apically.  The left circumflex artery has mild to moderate calcific atherosclerotic changes without obvious stenosis. The right coronary artery has mild to moderate calcific atherosclerotic changes with mild stenosis proximally.   Plan  -Consulted EP, appreciate recs- increased metoprolol from 25mg  XL daily to BID  -Increase pta metoprolol 25mg  XL from daily to BID -F/U FFR Cardiac CT to further risk stratify CAD, f/u outpatient for further ischemic evaluation  ???  Hx NSVT  Paroxysmal???Atrial Fibrillation???  s/p PVI ablation  S/p resection of LAA  Chronotropic incompetence  S/p PPM 2016  -PTA warfarin, metoprolol XL 25mg  QD  Plan:  -Metoprolol as above  -Resume pta warfarin, daily INR (goal 2-3)  ???  CAD  S/p angioplasty 1995  HTN  HL  -12/2018 Echo EF 60-65%  -12/2018 Stress test normal  - 2018 LHC mild CAD  - 01/2018 No evidence of delayed myocardial enhancement to suggest scar or infiltrating myocardial disease.  Plan:  -Continue pta ASA, atorvastatin 40mg  QD, zetia 10mg  QD, lisinopril 20mg , metoprolol XL 25mg  BID  ???  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  FEN: No IVF, replace lytes prn, cardiac diet  DVT ppx: warfarin  Code status: full code  Dispo: discharge home today    Discussed and seen with Dr. Glean Hess  ________________________________________________________________________    Subjective  Jonathan Humphrey is a 85 y.o. male.  No acute events overnight. Patient reports no shortness of breath, chest pain or lightheadedness. He states he would like to go home.     Medications  Scheduled Meds:aspirin EC tablet 81 mg, 81 mg, Oral, QDAY  atorvastatin (LIPITOR) tablet 40 mg, 40 mg, Oral, QHS  ezetimibe (ZETIA) tablet 10 mg, 10 mg, Oral, QDAY  levothyroxine (SYNTHROID) tablet 50 mcg, 50 mcg, Oral, QDAY(07)  lisinopriL (ZESTRIL) tablet 20 mg, 20 mg, Oral,  QDAY  metoprolol XL (TOPROL XL) tablet 25 mg, 25 mg, Oral, BID  pantoprazole DR (PROTONIX) tablet 40 mg, 40 mg, Oral, QDAY(21)  sodium chloride 0.9 %   infusion, 250 mL, Intravenous, Bolus  tamsulosin (FLOMAX) capsule 0.4 mg, 0.4 mg, Oral, QDAY after breakfast  warfarin (COUMADIN) tablet 4 mg, 4 mg, Oral, QDAY    Continuous Infusions:  PRN and Respiratory Meds:diphenhydrAMINE Once PRN, diphenhydrAMINE Once PRN, ivabradine Once PRN, methylPREDNISolone Once PRN, metoprolol Q5 MIN PRN, nitroglycerin PRN, sodium chloride 0.9% (NS) PRN      Review of Systems:  A 14 point review of systems was negative except for: urinary urgency    Objective:                          Vital Signs: Last Filed                 Vital Signs: 24 Hour Range   BP: 126/73 (06/18 0827)  Temp: 36.3 ???C (97.3 ???F) (06/18 0827)  Pulse: 61 (06/18 0827)  Respirations: 18 PER MINUTE (06/18 0827)  SpO2: 96 % (06/18 0827)  Height: 172.7 cm (68) (06/17 1539) BP: (101-138)/(57-87)   Temp:  [36.2 ???C (97.2 ???F)-36.6 ???C (97.8 ???F)]   Pulse:  [61-75]   Respirations:  [16 PER MINUTE-18 PER MINUTE]   SpO2:  [95 %-98 %]      Vitals:    06/07/19 1339 06/07/19 1539   Weight: 94.6 kg (208 lb 9.6 oz) 94.8 kg (209 lb)       Intake/Output Summary:  (Last 24 hours)    Intake/Output Summary (Last 24 hours) at 06/08/2019 1108  Last data filed at 06/08/2019 0455  Gross per 24 hour   Intake 300 ml   Output 0 ml   Net 300 ml           Physical Exam  ???  General:  Alert, cooperative, no distress, appears stated age  Head:  Normocephalic,  atraumatic  Eyes:  EOMs intact.    Ears:  Normal TMs and external ear canals, both ears  Nose:  Nares normal.   Lungs:  Clear to auscultation bilaterally  Chest wall:  No tenderness or deformity.  Heart:    Distant cardiac sounds, Regular rate and rhythm, S1, S2 normal, no murmur  Abdomen:  Soft, non-tender.  Bowel sounds normal.  No masses.    Extremities:  Extremities normal, trace edema  Neurologic:  No obvious focal deficits  Musculoskeletal:  Normal / Negative  Psych:  Normal  ???    Lab Review  24-hour labs:    Results for orders placed or performed during the hospital encounter of 06/07/19 (from the past 24 hour(s))   CBC AND DIFF    Collection Time: 06/07/19  2:00 PM   Result Value Ref Range    White Blood Cells 6.5 4.5 - 11.0 K/UL    RBC 4.65 4.4 - 5.5 M/UL    Hemoglobin 16.3 13.5 - 16.5 GM/DL    Hematocrit 93.2 40 - 50 %    MCV 100.8 (H) 80 - 100 FL    MCH 35.0 (H) 26 - 34 PG    MCHC 34.7 32.0 - 36.0 G/DL RDW 35.5 11 - 15 %    Platelet Count 182 150 - 400 K/UL    MPV 8.0 7 - 11 FL    Neutrophils 69 41 - 77 %    Lymphocytes 19 (L) 24 - 44 %  Monocytes 8 4 - 12 %    Eosinophils 3 0 - 5 %    Basophils 1 0 - 2 %    Absolute Neutrophil Count 4.51 1.8 - 7.0 K/UL    Absolute Lymph Count 1.20 1.0 - 4.8 K/UL    Absolute Monocyte Count 0.51 0 - 0.80 K/UL    Absolute Eosinophil Count 0.22 0 - 0.45 K/UL    Absolute Basophil Count 0.06 0 - 0.20 K/UL   PROTIME INR (PT)    Collection Time: 06/07/19  2:00 PM   Result Value Ref Range    INR 2.9 (H) 0.8 - 1.2   PTT (APTT)    Collection Time: 06/07/19  2:00 PM   Result Value Ref Range    APTT 41.3 (H) 24.0 - 36.5 SEC   COMPREHENSIVE METABOLIC PANEL    Collection Time: 06/07/19  2:00 PM   Result Value Ref Range    Sodium 139 137 - 147 MMOL/L    Potassium 4.1 3.5 - 5.1 MMOL/L    Chloride 107 98 - 110 MMOL/L    Glucose 91 70 - 100 MG/DL    Blood Urea Nitrogen 16 7 - 25 MG/DL    Creatinine 1.61 0.4 - 1.24 MG/DL    Calcium 9.5 8.5 - 09.6 MG/DL    Total Protein 6.7 6.0 - 8.0 G/DL    Total Bilirubin 1.1 0.3 - 1.2 MG/DL    Albumin 4.4 3.5 - 5.0 G/DL    Alk Phosphatase 42 25 - 110 U/L    AST (SGOT) 32 7 - 40 U/L    CO2 21 21 - 30 MMOL/L    ALT (SGPT) 35 7 - 56 U/L    Anion Gap 11 3 - 12    eGFR Non African American >60 >60 mL/min    eGFR African American >60 >60 mL/min   MAGNESIUM    Collection Time: 06/07/19  2:00 PM   Result Value Ref Range    Magnesium 2.1 1.6 - 2.6 mg/dL   BNP (B-TYPE NATRIURETIC PEPTI)    Collection Time: 06/07/19  2:00 PM   Result Value Ref Range    B Type Natriuretic Peptide 62.0 0 - 100 PG/ML   TROPONIN-I    Collection Time: 06/07/19  2:00 PM   Result Value Ref Range    Troponin-I 0.01 0.0 - 0.05 NG/ML   TSH WITH FREE T4 REFLEX    Collection Time: 06/07/19  2:00 PM   Result Value Ref Range    TSH 1.03 0.35 - 5.00 MCU/ML   COVID-19 (SARS-COV-2) PCR    Collection Time: 06/07/19  2:00 PM   Result Value Ref Range    COVID-19 (SARS-CoV-2) PCR Source NASOPHARYNGEAL SWAB COVID-19 (SARS-CoV-2) PCR NOT DETECTED DN-NOT DETECTED   PHOSPHORUS    Collection Time: 06/07/19  2:00 PM   Result Value Ref Range    Phosphorus 2.7 2.0 - 4.5 MG/DL   TROPONIN-I    Collection Time: 06/07/19  6:10 PM   Result Value Ref Range    Troponin-I 0.01 0.0 - 0.05 NG/ML   CBC AND DIFF    Collection Time: 06/08/19  4:42 AM   Result Value Ref Range    White Blood Cells 6.4 4.5 - 11.0 K/UL    RBC 4.41 4.4 - 5.5 M/UL    Hemoglobin 15.4 13.5 - 16.5 GM/DL    Hematocrit 04.5 40 - 50 %    MCV 100.3 (H) 80 - 100 FL    MCH 34.8 (H)  26 - 34 PG    MCHC 34.7 32.0 - 36.0 G/DL    RDW 09.8 11 - 15 %    Platelet Count 175 150 - 400 K/UL    MPV 8.1 7 - 11 FL    Neutrophils 66 41 - 77 %    Lymphocytes 19 (L) 24 - 44 %    Monocytes 9 4 - 12 %    Eosinophils 5 0 - 5 %    Basophils 1 0 - 2 %    Absolute Neutrophil Count 4.27 1.8 - 7.0 K/UL    Absolute Lymph Count 1.17 1.0 - 4.8 K/UL    Absolute Monocyte Count 0.58 0 - 0.80 K/UL    Absolute Eosinophil Count 0.29 0 - 0.45 K/UL    Absolute Basophil Count 0.03 0 - 0.20 K/UL   MAGNESIUM    Collection Time: 06/08/19  4:42 AM   Result Value Ref Range    Magnesium 2.1 1.6 - 2.6 mg/dL   PROTIME INR (PT)    Collection Time: 06/08/19  4:42 AM   Result Value Ref Range    INR 2.6 (H) 0.8 - 1.2   LIPID PROFILE    Collection Time: 06/08/19  4:42 AM   Result Value Ref Range    Cholesterol 111 <200 MG/DL    Triglycerides 89 <119 MG/DL    HDL 44 >14 MG/DL    LDL 58 <782 mg/dL    VLDL 18 MG/DL    Non HDL Cholesterol 67 MG/DL   BASIC METABOLIC PANEL    Collection Time: 06/08/19  4:42 AM   Result Value Ref Range    Sodium 138 137 - 147 MMOL/L    Potassium 4.0 3.5 - 5.1 MMOL/L    Chloride 106 98 - 110 MMOL/L    CO2 20 (L) 21 - 30 MMOL/L    Anion Gap 12 3 - 12    Glucose 103 (H) 70 - 100 MG/DL    Blood Urea Nitrogen 15 7 - 25 MG/DL    Creatinine 9.56 0.4 - 1.24 MG/DL    Calcium 8.7 8.5 - 21.3 MG/DL    eGFR Non African American >60 >60 mL/min    eGFR African American >60 >60 mL/min Point of Care Testing  (Last 24 hours)  Glucose: (!) 103 (06/08/19 0442)    Radiology and other Diagnostics Review:    Pertinent radiology reviewed.    Otilio Carpen, MBBS   Pager [phone]

## 2019-06-08 NOTE — Progress Notes
DC teaching completed, all question answered .,IV and tele  DC ,son to provide transportation home,walked ,to the lobby accompany by the Nurse .

## 2019-06-08 NOTE — Care Plan
Problem: Infection, Risk of  Goal: Absence of infection  Outcome: Goal Ongoing     Problem: Infection, Risk of  Goal: Knowledge of Infection Control Procedures  Outcome: Goal Ongoing   Planning DC home today,CT completed

## 2019-06-08 NOTE — Discharge Instructions - Pharmacy
Physician Discharge Summary      Name: Jonathan Humphrey  Medical Record Number: [id number]        Account Number:  [id number]  Date Of Birth:  [birth year]                         Age:  85 years   Admit date:  06/07/2019                     Discharge date:  06/08/2019    Attending Physician:  Dr. Gloris Humphrey               Service: Cardiology-1st Round/CCU- [id number]    Physician Summary completed by: Jonathan Humphrey, MBBS     Reason for hospitalization: NSVT    Significant PMH:   Medical History:   Diagnosis Date   ??? AK (actinic keratosis)     scalp   ??? Arthritis    ??? Atrial fibrillation (HCC) 01/16/2009   ??? Chronic anticoagulation 01/16/2009   ??? Coronary atherosclerosis 09/01/2006    Coronary artery disease   ??? Cyst     right upper back   ??? Dizziness 01/19/2007   ??? 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 Essential hypertension 04/11/2009   ??? Family history of cerebrovascular accident (CVA) 01/19/2007   ??? Fatigue 07/21/2007   ??? Generalized headaches    ??? GERD (gastroesophageal reflux disease) 12/30/2006   ??? Hearing loss 01/19/2007   ??? HLD (hyperlipidemia) 04/11/2009   ??? Hyperlipidemia 09/01/2006   ??? Hypertension, essential 09/01/2006   ??? Hypothyroidism 11/30/2007   ??? Intertrigo    ??? Knee pain 12/30/2006   ??? Neoplasm of uncertain behavior    ??? Onychomycosis    ??? PAF (paroxysmal atrial fibrillation) (HCC) 04/11/2009   ??? Photoaging of skin    ??? Seasonal allergic reaction    ??? Sinus bradycardia    ??? SK (seborrheic keratosis)     face, scalp, right ear   ??? SSS (sick sinus syndrome) (HCC)    ??? Tinea pedis    ??? Verruca vulgaris          Allergies: Patient has no known allergies.    Admission Physical Exam notable for:   General:??????Alert, cooperative, no distress, appears stated age  Head:??????Normocephalic, ???atraumatic  Eyes:??????EOMs intact.??????  Ears:??????Normal TMs and external ear canals, both ears  Nose:??????Nares normal.???  Lungs:??????Clear to auscultation bilaterally  Chest wall:??????No tenderness or deformity. Heart:????????????Distant cardiac sounds,???Regular rate and rhythm, S1, S2 normal, no murmur  Abdomen:??????Soft, non-tender. ???Bowel sounds normal. ???No masses. ???  Extremities:??????Extremities normal, trace edema  Neurologic:??????No obvious focal deficits  Musculoskeletal:??????Normal / Negative  Psych:??????Normal    Admission Lab/Radiology studies notable for:   -K 4.7, Mag 2.1, TSH WNL  -CXR without acute process  -Echo EF 65%  -Coronary CTA with extensive coronary artery calcifications making the accurate estimation   of stenosis difficult.     Brief Hospital Course:  The patient was admitted and the following issues were addressed during this hospitalization: (with pertinent details).  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 Device interrogation indicated a 15 second run of NSVT on 06/05/19. Patient stated he stopped taking metoprolol  8 days PTA due to concern for sexual side effects. Patient was asymptomatic during the episode. He was monitored overnight without any significant events noted on telemetry. EP was consulted and recommended increasing pta metoprolol XL from 25mg  QD to BID. Echo was done which showed an EF of 65%. Coronary CTA was done which showed extensive coronary artery calcifications with FFR pending on discharge. He will follow up with Dr. Vivianne Humphrey on 06/27/19 for further ischemic evaluation.      Condition at Discharge: Stable    Discharge Diagnoses:      Hospital Problems        Active Problems    * (Principal) Paroxysmal ventricular tachycardia (HCC)    Coronary artery disease, non-occlusive    HLD (hyperlipidemia)    Essential hypertension    GERD (gastroesophageal reflux disease) Hypothyroid    PAF (paroxysmal atrial fibrillation) (HCC)    Chronic anticoagulation, on warfarin    Cardiac pacemaker in situ          Surgical Procedures: None    Significant Diagnostic Studies and Procedures: noted in brief hospital course    Consults:  Cardiology - EP    Patient Disposition: Home       Patient instructions/medications:       Cardiac Diet    Limiting unhealthy fats and cholesterol is the most important step you can take in reducing your risk for cardiovascular disease.  Unhealthy fats include saturated and trans fats.  Monitor your sodium and cholesterol intake.  Restrict your sodium to 2g (grams) or 2000mg  (milligrams) daily, and your cholesterol to 200mg  daily.    If you have questions regarding your diet at home, you may contact a dietitian at [phone].       Report These Signs and Symptoms    Please contact your doctor if you have any of the following symptoms: temperature higher than 100.4 degrees F, uncontrolled pain, persistent nausea and/or vomiting, difficulty breathing, chest pain, severe abdominal pain, headache, unable to urinate, unable to have bowel movement or drainage with a foul odor     Risk Reduction Plan    Cardiac Event Personal Risk Factor Reduction Plan  **Take this sheet to your physician to show treatment recommendations**  Jonathan Humphrey  Admission Date: 06/07/2019 LOS: @LOS @       Blood Pressure Risk  Goal: Keep blood pressure below 130/80  Your Numbers:  BP Readings from Last 1 Encounters:  06/08/19 : 126/73     Plan: High blood pressure is the single most important risk factor for cardiac events because it's the #1 cause of cardiac events.  Take medication as prescribed and monitor your blood pressure.    Abnormal lipids (fats in blood) Risk   Goal: Total Cholesterol: <200  LDL (bad cholesterol): primary prevention <100   LDL (bad cholesterol): secondary prevention < 70  HDL (good cholesterol): >40 for men, >50 for women  Triglycerides: <150 Your Numbers: Cholesterol       Date                     Value               Ref Range           Status                06/08/2019               111                 <  200 MG/DL          Final            ----------  LDL       Date                     Value               Ref Range           Status                06/08/2019               58                  <100 mg/dL          Final            ----------  HDL       Date                     Value               Ref Range           Status                06/08/2019               44                  >40 MG/DL           Final            ----------  Triglycerides       Date                     Value               Ref Range           Status                06/08/2019               89                  <150 MG/DL          Final            ----------  Plan: Diets high in saturated fat, trans fat and cholesterol can raise blood cholesterol levels increasing your risk of having a cardiac event.  Take medication as prescribed and eat a heart healthy, low sodium diet.    Smoking Risk   Goals: If you smoke, STOP!   Plan: Smoking DOUBLES your risk of having a cardiac event. Quitting can greatly reduce your risk. To register for smoking cessation program call [phone] or visit www.smokefree.gov    Diabetes Risk   Goal: Non-diabetic: Below 5.7%  Goal for diabetic: Less than 7%  Your Numbers: Hemoglobin A1C       Date                     Value               Ref Range           Status                01/03/2019               5.6  4.0 - 6.0 %         Final              Comment:    The ADA recommends that most patients with type 1 and type 2 diabetes maintain     an A1c level <7%.      ----------  Plan: If you have diabetes, even if treated, you are at an increased risk of having a cardiac event.     Alcohol Use Risk  Goal: Alcohol use can lead to a cardiac event.  Plan: For men, limit intake to no more than 2 drinks per day. For women, limit intake to no more than one drink per day.    Weight Management Risk   Goal: Healthy: BMI is 18.5 to 24.9  Overweight: BMI is 25 to 29.9  Obese: BMI is 30 or higher  Morbid Obesity: BMI [...]  Your Numbers: Your BMI (Calculated): 31.78  Plan: If you have questions about your diet after you go home, you can call a dietitian at [phone]    Physical Activity Risk   Goal: Patients should have approval by a physician prior to beginning an exercise program.  Plan: Try to get at least 30 minutes of moderate physical activity five days a week or 20 minutes of vigorous physical activity three days a week with your doctor's approval.    To the Neurology and the NeuroSurg Discharge order sets set add a new order in the education section titled Risk Reduction Plan, in the comments section add the following (default select this new order for neuro and neuro surg and ensure this information is added to the AVS).     Questions About Your Stay    For questions or concerns regarding your hospital stay:    - DURING BUSINESS HOURS (8:00 AM - 4:30 PM):    Call [phone] and asked to be transferred to your discharge attending physician.    - AFTER BUSINESS HOURS (4:30 PM - 8:00 AM, on weekends, or holidays):  Call [phone] and ask the operator to page the on-call doctor for the discharge attending physician.     Discharging attending physician: Lessie Dings [[id number]]      Activity as Tolerated    It is important to keep increasing your activity level after you leave the hospital.  Moving around can help prevent blood clots, lung infection (pneumonia) and other problems.  Gradually increasing the number of times you are up moving around will help you return to your normal activity level more quickly.  Continue to increase the number of times you are up to the chair and walking daily to return to your normal activity level. Begin to work towards your normal activity level at discharge. REQUEST FOR CARDIOLOGY APPOINTMENT   Standing Status: Future Standing Exp. Date: 06/06/24    post hospital follow up     Scheduling Priority: Routine    Schedule OV with (1st choice Provider) Jen Mow M.D.       Current Discharge Medication List       CONTINUE these medications which have been CHANGED or REFILLED    Details   metoprolol XL (TOPROL XL) 25 mg extended release tablet Take one tablet by mouth every 12 hours.  Qty: 90 tablet, Refills: 3    PRESCRIPTION TYPE:  Normal          CONTINUE these medications which have NOT CHANGED    Details   aspirin EC 81 mg tablet  Take 1 tablet by mouth daily. Take with food.  Qty: 90 tablet, Refills: 3    PRESCRIPTION TYPE:  OTC      atorvastatin (LIPITOR) 40 mg tablet Take one tablet by mouth at bedtime daily.  Qty: 90 tablet, Refills: 1    PRESCRIPTION TYPE:  Normal  Associated Diagnoses: Coronary artery disease, non-occlusive; Mixed hyperlipidemia; Atrial fibrillation, unspecified type (HCC); Hypothyroidism, unspecified type      cholecalciferol (VITAMIN D-3) 1,000 units tablet Take 1,500 Units by mouth daily.    PRESCRIPTION TYPE:  Historical Med      ezetimibe (ZETIA) 10 mg tablet Take one tablet by mouth daily.  Qty: 90 tablet, Refills: 1    PRESCRIPTION TYPE:  Normal  Associated Diagnoses: Coronary artery disease, non-occlusive; Mixed hyperlipidemia; Atrial fibrillation, unspecified type (HCC); Hypothyroidism, unspecified type      ketoconazole (NIZORAL) 2 % topical cream Apply  to affected area twice daily. to scaly areas on forehead and nose  Qty: 60 g, Refills: 3    PRESCRIPTION TYPE:  Normal      levothyroxine (SYNTHROID) 50 mcg tablet TAKE (1) TABLET BY MOUTH ONCE DAILY  Qty: 90 tablet, Refills: 1    PRESCRIPTION TYPE:  Normal  Associated Diagnoses: Coronary artery disease, non-occlusive; Mixed hyperlipidemia; Atrial fibrillation, unspecified type (HCC); Hypothyroidism, unspecified type lisinopriL (ZESTRIL) 20 mg tablet TAKE (1) TABLET BY MOUTH ONCE DAILY  Qty: 90 tablet, Refills: 2    PRESCRIPTION TYPE:  Normal      OMEGA-3 FATTY ACIDS/FISH OIL (FISH OIL-OMEGA-3 FATTY ACIDS PO) Take  by mouth daily. 2 tsp daily      PRESCRIPTION TYPE:  Historical Med      omeprazole DR(+) (PRILOSEC) 20 mg capsule Take one capsule by mouth daily.  Qty: 90 capsule, Refills: 1    PRESCRIPTION TYPE:  Normal  Associated Diagnoses: Coronary artery disease, non-occlusive; Mixed hyperlipidemia; Atrial fibrillation, unspecified type (HCC); Hypothyroidism, unspecified type      tamsulosin (FLOMAX) 0.4 mg capsule TAKE 2 CAPSULES 30 MINUTES FOLLOWING THE SAME MEAL DAILY. DO NOT CRUSH, CHEW OR OPEN CAPSULES.  Qty: 180 capsule, Refills: 1    PRESCRIPTION TYPE:  Normal      warfarin (COUMADIN) 4 mg tablet TAKE 1 TABLET DAILY OR AS INSTRUCTED BY DR. Merry Lofty: 90 tablet, Refills: 3    PRESCRIPTION TYPE:  Normal  Associated Diagnoses: Aortic valve mass           Scheduled appointments:    Jun 08, 2019  8:00 PM CDT  INPT CT EXAM with CT-HOSPITAL ROOM 1 (FLASH)  [facility] (Radiology) [address]  [address]  [place] [address]  [phone]   Jun 27, 2019  1:15 PM CDT  Hospital Follow Up with Reubin Milan, MD  [facility] (CVM Exam) [address]  [place] [address]  [place] [address]  [phone]   Sep 12, 2019 10:40 AM CDT  Return Patient with Roderic Scarce Al-Hihi, MD  Internal Medicine (Internal Medicine) [address]  [place] [address]  [phone]          Pending items needing follow up: Coronary CTA FFR    Signed:  Otilio Humphrey, MBBS  06/08/2019      cc:  Primary Care Physician:  Lelan Pons     Referring physicians:  Reubin Milan, MD   Additional provider(s):

## 2019-06-08 NOTE — Case Management (ED)
Case Management Admission Assessment    NAME:Jonathan Humphrey                          MRN: [id number]             DOB:09/18/1934          AGE: 85 y.o.  ADMISSION DATE: 06/07/2019             DAYS ADMITTED: LOS: 1 day      Today???s Date: 06/08/2019    Source of Information: wife        Plan: DC home today - no CM needs.  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 Met with pt via phone due to COVID precautions. Introduced self, discussed role, and provided contact information. Encouraged pt to reach out with questions and concerns.     *Lives with his wife Huntley Dec in [place] (about 120 miles from [place]). Wife is 77yo and is in relative ly good health - able to help at DC if needed. Son Kathlene November lives in [place] and will provide transportation home.  *IADL - slow  *No DME   *INR monitor - Dr. Vivianne Spence    Wife voiced readiness for patient's return home today - no CM needs identified.       Patient Address/Phone  [address] [place] [address]     [phone] (home)     Emergency Contact  Extended Emergency Contact Information  Primary Emergency Contact: Jiles [place]rows [place]  Home Phone: [phone]  Mobile Phone: [phone]  Relation: Spouse    Healthcare Directive  Healthcare Directive: Yes, patient has a healthcare directive  Type of Healthcare Directive: Durable power of attorney for healthcare, Healthcare directive Location of Healthcare Directive: Patient does not have it with him/her  Would patient like to fill out a (a new) Editor, commissioning?: N/A  Psych Advance Directive (Psych unit only): No, patient does not have a Social research officer, government  Does the patient need discharge transport arranged?: No  Transportation Name, Phone and Availability #1: Son Kathlene November - lives in [place]  Does the patient use Medicaid Transportation?: No    Expected Discharge Date  Expected Discharge Date: 06/08/19  Expected Discharge Time: 1300    Living Situation Prior to Admission  ? Living Arrangements  Type of Residence: Home, independent  Living Arrangements: Spouse/significant other  How many levels in the residence?: 2  Can patient live on one level if needed?: No(Upstairs bedroom - 12 steps with railing)  Does residence have entry and/or side stairs?: Yes(4, then 6 to enter -denies concerns with stair mobility)  Assistance needed prior to admit or anticipated on discharge: No  Who provides assistance or could if needed?: Wife  Are they in good health?: Unknown  Can support system provide 24/7 care if needed?: Yes  ? Level of Function   Prior level of function: Independent  ? Cognitive Abilities  Cognitive Abilities: Alert and Oriented, Engages in problem solving and planning(Hard of hearing)    Financial Resources  ? Coverage  Primary Insurance: Medicare(A/B)  Secondary Insurance: Medicare Supplement(BCBS)  Additional Coverage: RX    ? Source of Income   Source Of Income: SSDI, Other retirement income  ? Financial Assistance Needed?  no    Psychosocial Needs  ? Mental Health  Mental Health History: No  ? Substance Use History  Substance Use History Screen: No  ? Other  no    Current/Previous Services  ? PCP  Al-Hihi, Roderic Scarce, [phone], [phone]   Cardiologist Dr. Vivianne Spence    ? Pharmacy    [id number] - [place] - [address]  [address] [place] [address] Phone: [phone] Fax: [phone]    [facility]  [address]  [place] [address]  Phone: [phone] Fax: [phone]    [facility] [place] - [address]  [address]  [place] [address]  Phone: [phone] Fax: [phone]    ? Durable Medical Equipment   Durable Medical Equipment at home: INR Machine(Followed by Dr. Vivianne Spence)  ? Home Health  Receiving home health: No(May have [facility] 5 years ago - wife does not recall)  ? Hemodialysis or Peritoneal Dialysis  Undergoing hemodialysis or peritoneal dialysis: No  ? Tube/Enteral Feeds  Receive tube/enteral feeds: No  ? Infusion  Receive infusions: No  ? Private Duty  Private duty help used: No  ? Home and Community Based Services  Home and community based services: No  ? [employer]: N/A  ? Hospice  Hospice: No  ? Outpatient Therapy  PT: In the past  Name of rehab location/group: A local PT gym - name not provided  Would patient return for future services?: Yes  OT: No  SLP: No  ? Skilled Nursing Facility/Nursing Home  SNF: No  NH: No  ? Inpatient Rehab  IPR: No  ? Long-Term Acute Care Hospital  LTACH: No  ? Acute Hospital Stay  Acute Hospital Stay: In the past  Was patient's stay within the last 30 days?: No    Darryl Nestle, BSN, IT sales professional or Pager: [phone]

## 2019-06-09 ENCOUNTER — Encounter: Admit: 2019-06-09 | Discharge: 2019-06-09

## 2019-06-09 MED ORDER — METOPROLOL SUCCINATE 25 MG PO TB24
25 mg | ORAL_TABLET | Freq: Two times a day (BID) | ORAL | 0 refills | 90.00000 days | Status: DC
Start: 2019-06-09 — End: 2019-06-15
  Filled 2019-06-08: qty 90, 45d supply, fill #1

## 2019-06-09 MED ORDER — EZETIMIBE 10 MG PO TAB
10 mg | ORAL_TABLET | Freq: Every day | ORAL | 1 refills | Status: DC
Start: 2019-06-09 — End: 2020-01-12

## 2019-06-09 NOTE — Telephone Encounter
Hospital Discharge Follow Up      Reached Patient:Yes     Admission Information:     Hospital Name: [facility]  Admission Date: 06/07/2019  Discharge Date: 06/08/2019    Admission Diagnosis: Ventricular tachycardia  Discharge Diagnosis: (Principal)Paroxysmal ventricular tachycardia, Coronary arteryy disease non-occlusive, HLD, Essential hypertension, GERD, Hypothyroid, Paroxysmal atrial fibrillation, Chronic anticoagulation on warfarin, Cardiac pacemaker in situ  Has there been a discharge within the last 30 days? No  If yes, reason: N/A  Hospital Services: Unplanned  Today's call is 1(business) days post discharge      Discharge Instruction Review   Did patient receive and understand discharge instructions? Yes    Home Health ordered? No                 Agency name/telephone number: N/A   Has Home Health agency contacted patient? No   Caregiver assistance in the home? No   Are there concerns regarding the patient's ADL'S? No  Is patient a fall risk? Yes    Special diet? Yes If yes, type: Cardiac diet      Medication Reconciliation    Changes to pre-hospital medications? Yes   CHANGE how you take:  metoprolol XL (TOPROL XL)  Were new prescriptions filled?No,  due to no new medications ordered  Meds reviewed and reconciled?Yes  ??? aspirin EC 81 mg tablet Take 1 tablet by mouth daily. Take with food.   ??? atorvastatin (LIPITOR) 40 mg tablet Take one tablet by mouth at bedtime daily.   ??? cholecalciferol (VITAMIN D-3) 1,000 units tablet Take 1,500 Units by mouth daily.   ??? ezetimibe (ZETIA) 10 mg tablet Take one tablet by mouth daily.   ??? ketoconazole (NIZORAL) 2 % topical cream Apply  to affected area twice daily. to scaly areas on forehead and nose   ??? levothyroxine (SYNTHROID) 50 mcg tablet TAKE (1) TABLET BY MOUTH ONCE DAILY   ??? lisinopriL (ZESTRIL) 20 mg tablet TAKE (1) TABLET BY MOUTH ONCE DAILY   ??? metoprolol XL (TOPROL XL) 25 mg extended release tablet Take one tablet by mouth every 12 hours.   ??? OMEGA-3 FATTY ACIDS/FISH OIL (FISH OIL-OMEGA-3 FATTY ACIDS PO) Take  by mouth daily. 2 tsp daily     ??? omeprazole DR(+) (PRILOSEC) 20 mg capsule Take one capsule by mouth daily.   ??? tamsulosin (FLOMAX) 0.4 mg capsule TAKE 2 CAPSULES 30 MINUTES FOLLOWING THE SAME MEAL DAILY. DO NOT CRUSH, CHEW OR OPEN CAPSULES.   ??? warfarin (COUMADIN) 4 mg tablet TAKE 1 TABLET DAILY OR AS INSTRUCTED BY DR. Vivianne Spence (Patient taking differently: Take 6 mg on Tuesdays and a 4 mg tablet all other days of the week AS INSTRUCTBY DR. Parke Simmers)         Understanding Condition   Having any current symptoms? No Patient denies palpitations, chest pain, shortness of breath, edema, nausea/vomiting, diarrhea/constipation, fever/chills.  Patient states yesterday he was fatigued but feels much better now.  Patient denies checking his blood pressure or heart rate since hospital discharge.  Patient understands when to seek additional medical care? Yes   Other instructions provided : Activity as tolerated     Scheduling Follow-up Appointment   Upcoming appointment date and time and with whom scheduled:   Future Appointments   Date Time Provider Department Center   06/27/2019  1:15 PM Reubin Milan, MD [facility] CVM Exam   08/03/2019  6:30 AM [facility] CVM Procedur   09/12/2019 10:40 AM Al-Hihi,  Roderic Scarce, MD [facility]     PCP appointment scheduled?Yes, Date: 09/12/2019.  Patient declines sooner appointment   PCP primary location: [facility]  IM Gen Medicine  Specialist appointment scheduled? Yes, with Cardiology 06/27/2019  Both PCP and Specialist appointment scheduled: Yes  Is assistance with transportation needed?No      Yancey Flemings

## 2019-06-09 NOTE — Telephone Encounter
Refill Request received.    Patient last seen 01/03/2019.    Follow up appt scheduled for 09/12/2019.    3 months and 1 refill e-scribed per protocol.    Hepatic Function    Lab Results   Component Value Date/Time    ALBUMIN 4.4 06/07/2019 02:00 PM    TOTPROT 6.7 06/07/2019 02:00 PM    ALKPHOS 42 06/07/2019 02:00 PM    Lab Results   Component Value Date/Time    AST 32 06/07/2019 02:00 PM    ALT 35 06/07/2019 02:00 PM    TOTBILI 1.1 06/07/2019 02:00 PM

## 2019-06-15 ENCOUNTER — Encounter: Admit: 2019-06-15 | Discharge: 2019-06-15

## 2019-06-15 MED ORDER — METOPROLOL SUCCINATE 25 MG PO TB24
25 mg | ORAL_TABLET | Freq: Two times a day (BID) | ORAL | 3 refills | 90.00000 days | Status: DC
Start: 2019-06-15 — End: 2019-09-12

## 2019-06-21 ENCOUNTER — Encounter: Admit: 2019-06-21 | Discharge: 2019-06-21

## 2019-06-21 NOTE — Progress Notes
In range so no call placed.

## 2019-06-27 ENCOUNTER — Ambulatory Visit: Admit: 2019-06-27 | Discharge: 2019-06-28

## 2019-06-27 ENCOUNTER — Ambulatory Visit: Admit: 2019-06-27 | Discharge: 2019-06-27

## 2019-06-27 ENCOUNTER — Encounter: Admit: 2019-06-27 | Discharge: 2019-06-27

## 2019-06-27 DIAGNOSIS — R5383 Other fatigue: Secondary | ICD-10-CM

## 2019-06-27 DIAGNOSIS — Z7901 Long term (current) use of anticoagulants: Secondary | ICD-10-CM

## 2019-06-27 DIAGNOSIS — Z823 Family history of stroke: Secondary | ICD-10-CM

## 2019-06-27 DIAGNOSIS — I10 Essential (primary) hypertension: Secondary | ICD-10-CM

## 2019-06-27 DIAGNOSIS — B351 Tinea unguium: Secondary | ICD-10-CM

## 2019-06-27 DIAGNOSIS — I480 Paroxysmal atrial fibrillation: Secondary | ICD-10-CM

## 2019-06-27 DIAGNOSIS — E039 Hypothyroidism, unspecified: Secondary | ICD-10-CM

## 2019-06-27 DIAGNOSIS — Z950 Presence of cardiac pacemaker: Secondary | ICD-10-CM

## 2019-06-27 DIAGNOSIS — H9190 Unspecified hearing loss, unspecified ear: Secondary | ICD-10-CM

## 2019-06-27 DIAGNOSIS — L570 Actinic keratosis: Secondary | ICD-10-CM

## 2019-06-27 DIAGNOSIS — Z1159 Encounter for screening for other viral diseases: Secondary | ICD-10-CM

## 2019-06-27 DIAGNOSIS — R51 Headache: Secondary | ICD-10-CM

## 2019-06-27 DIAGNOSIS — R0602 Shortness of breath: Secondary | ICD-10-CM

## 2019-06-27 DIAGNOSIS — J302 Other seasonal allergic rhinitis: Secondary | ICD-10-CM

## 2019-06-27 DIAGNOSIS — E782 Mixed hyperlipidemia: Secondary | ICD-10-CM

## 2019-06-27 DIAGNOSIS — L821 Other seborrheic keratosis: Secondary | ICD-10-CM

## 2019-06-27 DIAGNOSIS — M25569 Pain in unspecified knee: Secondary | ICD-10-CM

## 2019-06-27 DIAGNOSIS — D489 Neoplasm of uncertain behavior, unspecified: Secondary | ICD-10-CM

## 2019-06-27 DIAGNOSIS — R42 Dizziness and giddiness: Secondary | ICD-10-CM

## 2019-06-27 DIAGNOSIS — L304 Erythema intertrigo: Secondary | ICD-10-CM

## 2019-06-27 DIAGNOSIS — I4891 Unspecified atrial fibrillation: Secondary | ICD-10-CM

## 2019-06-27 DIAGNOSIS — I2510 Atherosclerotic heart disease of native coronary artery without angina pectoris: Secondary | ICD-10-CM

## 2019-06-27 DIAGNOSIS — I472 Ventricular tachycardia: Secondary | ICD-10-CM

## 2019-06-27 DIAGNOSIS — B078 Other viral warts: Secondary | ICD-10-CM

## 2019-06-27 DIAGNOSIS — F5221 Male erectile disorder: Secondary | ICD-10-CM

## 2019-06-27 DIAGNOSIS — I495 Sick sinus syndrome: Secondary | ICD-10-CM

## 2019-06-27 DIAGNOSIS — K219 Gastro-esophageal reflux disease without esophagitis: Secondary | ICD-10-CM

## 2019-06-27 DIAGNOSIS — M1990 Unspecified osteoarthritis, unspecified site: Secondary | ICD-10-CM

## 2019-06-27 DIAGNOSIS — B353 Tinea pedis: Secondary | ICD-10-CM

## 2019-06-27 DIAGNOSIS — L578 Other skin changes due to chronic exposure to nonionizing radiation: Secondary | ICD-10-CM

## 2019-06-27 DIAGNOSIS — R001 Bradycardia, unspecified: Secondary | ICD-10-CM

## 2019-06-27 DIAGNOSIS — E785 Hyperlipidemia, unspecified: Secondary | ICD-10-CM

## 2019-06-27 LAB — COMPREHENSIVE METABOLIC PANEL
Lab: 0.8 mg/dL (ref 0.4–1.24)
Lab: 137 MMOL/L (ref 137–147)
Lab: 17 mg/dL (ref 7–25)
Lab: 24 MMOL/L (ref 21–30)
Lab: 25 U/L (ref 7–40)
Lab: 29 U/L (ref 7–56)
Lab: 4.4 g/dL — ABNORMAL LOW (ref 3.5–5.0)
Lab: 44 U/L (ref 25–110)
Lab: 6.7 g/dL (ref 6.0–8.0)
Lab: 9 K/UL (ref 3–12)
Lab: 9.4 mg/dL (ref 8.5–10.6)
Lab: 95 mg/dL — ABNORMAL HIGH (ref 70–100)
Lab: >60 mL/min (ref >60)

## 2019-06-27 LAB — PROTIME INR (PT): Lab: 2.7 MMOL/L — ABNORMAL HIGH (ref 0.8–1.2)

## 2019-06-27 LAB — CBC AND DIFF
Lab: 4.6 M/UL (ref 4.4–5.5)
Lab: 46 % (ref 40–50)
Lab: 6.5 10*3/uL (ref 4.5–11.0)
Lab: 68 % (ref 41–77)

## 2019-06-27 MED ORDER — TEMAZEPAM 15 MG PO CAP
15 mg | Freq: Every evening | ORAL | 0 refills | Status: CN | PRN
Start: 2019-06-27 — End: ?

## 2019-06-27 MED ORDER — DOCUSATE SODIUM 100 MG PO CAP
100 mg | Freq: Every day | ORAL | 0 refills | Status: CN | PRN
Start: 2019-06-27 — End: ?

## 2019-06-27 MED ORDER — ALUMINUM-MAGNESIUM HYDROXIDE 200-200 MG/5 ML PO SUSP
30 mL | ORAL | 0 refills | Status: CN | PRN
Start: 2019-06-27 — End: ?

## 2019-06-27 MED ORDER — NITROGLYCERIN 0.4 MG SL SUBL
.4 mg | SUBLINGUAL | 0 refills | Status: CN | PRN
Start: 2019-06-27 — End: ?

## 2019-06-27 MED ORDER — ACETAMINOPHEN 325 MG PO TAB
650 mg | ORAL | 0 refills | Status: CN | PRN
Start: 2019-06-27 — End: ?

## 2019-06-27 NOTE — Progress Notes
Date of Service: 06/27/2019    Jonathan Humphrey is a 85 y.o. male.       HPI    Jonathan Humphrey comes for followup.  I saw him about 7 months ago.  He is a very pleasant 85 year old gentleman.  He has a history of paroxysmal atrial fibrillation.  He has had previous AFib ablation.  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  He has had resection of his left atrial appendage.  He had angioplasty in 1995.  He has a permanent pacemaker for chronotropic incompetence.  He has had hypertension, hyperlipidemia, hypothyroidism, and gastroesophageal reflux.  He had a heart catheterization 2 years ago that showed only mild nonobstructive disease.  He has had normal heart function.  He had an MRI that did not show any delayed hyperenhancement.     He was hospitalized on June the 17th with sustained VT which was detected by his pacemaker, but he did not have symptoms.  He had stopped his metoprolol just proximate to that event with some fatigue and tiredness and was concerned that that may have been the explanation.  He has only been on metoprolol 25 mg per day.  While in the hospital, there were no obvious electrolyte changes.  His echo showed normal ejection fraction with no change.  He had a coronary CT angiogram which was technically difficult to interpret due to the extensive calcification, and FFR was not performed.  He started back on his metoprolol.  He continues to have some fatigue and dyspnea with exertion.  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  He denies palpitations, presyncope, syncope, TIA, stroke, and claudication.  He has been no fever, chills, or sweats.    ([id number])               Vitals:    06/27/19 1324   BP: 126/68   BP Source: Arm, Left Upper   Pulse: 74   SpO2: 96%   Weight: 95.4 kg (210 lb 6.4 oz)   Height: 1.727 m (5' 8)   PainSc: Zero     Body mass index is 31.99 kg/m???.     Past Medical History Patient Active Problem List    Diagnosis Date Noted   ??? Rash 11/14/2018   ??? Head trauma, initial encounter 10/20/2018   ??? Carpal tunnel syndrome of left wrist 07/05/2018   ??? PTSD (post-traumatic stress disorder) 07/05/2018   ??? Benign prostatic hyperplasia with lower urinary tract symptoms 06/03/2017   ??? Shoulder arthritis 04/05/2017   ??? Trigger middle finger of right hand 12/09/2016   ??? Paroxysmal ventricular tachycardia (HCC) 08/13/2016   ??? Right inguinal hernia 12/06/2015   ??? Cardiac pacemaker in situ 10/18/2015     ??? 10/21/15 Medtronic dual-chamber PPM implantation - Dr. Naoma Diener     ??? 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 BMI 31.0-31.9,adult 08/10/2015   ??? Primary osteoarthritis of left knee 06/04/2015   ??? AV block, 2nd degree 07/14/2010   ??? Leg cramps, sleep related 04/10/2010   ??? Aortic valve mass 03/28/2010     05/14/2010: Pt. underwent resection of intracardiac mass from right coronary cusp of aortic valve and modified radiofrequency maze with bilateral pulmonary vein isolation and resection of left atrial appendage by Dr. Helen Hashimoto.     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  Aortic valve function was preserved.  ???     ??? 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 Micheline Rough: 10-20% prox-CFX, 20-30% prox-RCA     ??? HLD (hyperlipidemia) 04/11/2009   ??? Essential hypertension 04/11/2009   ??? GERD (gastroesophageal reflux disease) 04/11/2009   ??? 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 Erectile dysfunction of non-organic origin 04/11/2009   ??? Hypothyroid 04/11/2009   ??? PAF (paroxysmal atrial fibrillation) (HCC) 04/11/2009   ??? Chronic anticoagulation, on warfarin 04/11/2009         Review of Systems   All other systems reviewed and are negative.  REVIEW OF SYSTEMS:  As documented in the database.    ([id number])        Physical Exam On examination, he is in no distress.  He is 5 feet 8.  Weight is 210.  BMI is 31.9.  Blood pressure 126/68.  Pulse is regular at 75 beats per minute.  His rhythm is paced.  His venous pressure is normal.  Saturation is 96%.  There is no cyanosis.  There is no edema.  Lungs are clear.  There is no wheeze or rhonchi.  PMI is not felt.  Heart sounds are normal.  I do not hear any gallops, murmurs, or rubs.  There is no hepatomegaly.  There are no abdominal bruits.  Bowel sounds are normal.  There is no icterus.  There are no focal neurologic deficits.  Distal pulses are 2+ bilaterally.  There are no carotid bruits.  He is alert and oriented times 3.  His mood, judgment, and affect are normal.    ([id number])        Cardiovascular Studies  EKG shows ventricular-paced rhythm.  There has been no change.    ([id number])        Problems Addressed Today  Encounter Diagnoses   Name Primary?   ??? Coronary artery disease, non-occlusive Yes   ??? Essential hypertension    ??? Mixed hyperlipidemia    ??? PAF (paroxysmal atrial fibrillation) (HCC)    ??? Chronic anticoagulation, on warfarin    ??? 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 Cardiac pacemaker in situ    ??? Paroxysmal ventricular tachycardia (HCC)        Assessment and Plan    Jonathan Humphrey had sustained ventricular tachycardia.  He was asymptomatic.  The cause of this is unclear.  It may have been related to discontinuation of his beta blocker.  He is now back on his metoprolol 25 mg b.i.d.  His ejection fraction remains normal.  I am not sure about his coronary status due to the extensive nature of the calcification.  I think he needs a diagnostic invasive coronary angiogram to be sure that there is not progressive coronary disease to account for his symptoms of fatigue and dyspnea, as well as the arrhythmia.  I would like to check lab work today.  I would like to get a device check.  I would like to get him in for a coronary angiogram and possible intervention.  We talked about the procedure potential risks, including moderate conscious sedation and possible intervention.  I will keep you informed with the results.  We will continue to follow him closely.  He needs ongoing risk factor modification.    ([id number])  He will need to hold his warfarin.  We will check his INR today.  I do not think he needs bridging.  He has not  had any recent atrial fibrillation.    ([id number])               Current Medications (including today's revisions)  ??? aspirin EC 81 mg tablet Take 1 tablet by mouth daily. Take with food.   ??? atorvastatin (LIPITOR) 40 mg tablet Take one tablet by mouth at bedtime daily.   ??? cholecalciferol (VITAMIN D-3) 1,000 units tablet Take 1,500 Units by mouth daily.   ??? ezetimibe (ZETIA) 10 mg tablet Take one tablet by mouth daily.   ??? ketoconazole (NIZORAL) 2 % topical cream Apply  to affected area twice daily. to scaly areas on forehead and nose   ??? levothyroxine (SYNTHROID) 50 mcg tablet TAKE (1) TABLET BY MOUTH ONCE DAILY   ??? lisinopriL (ZESTRIL) 20 mg tablet TAKE (1) TABLET BY MOUTH ONCE DAILY   ??? metoprolol XL (TOPROL XL) 25 mg extended release tablet Take one tablet by mouth every 12 hours.   ??? OMEGA-3 FATTY ACIDS/FISH OIL (FISH OIL-OMEGA-3 FATTY ACIDS PO) Take  by mouth daily. 2 tsp daily     ??? omeprazole DR(+) (PRILOSEC) 20 mg capsule Take one capsule by mouth daily.   ??? tamsulosin (FLOMAX) 0.4 mg capsule TAKE 2 CAPSULES 30 MINUTES FOLLOWING THE SAME MEAL DAILY. DO NOT CRUSH, CHEW OR OPEN CAPSULES.   ??? warfarin (COUMADIN) 4 mg tablet TAKE 1 TABLET DAILY OR AS INSTRUCTED BY DR. Vivianne Spence (Patient taking differently: Take 6 mg on Tuesdays and a 4 mg tablet all other days of the week AS INSTRUCTBY DR. Parke Simmers)

## 2019-06-27 NOTE — Progress Notes
INR within therapeutic range

## 2019-06-27 NOTE — Patient Instructions
We would like to follow up with you after testing is completed.     Please have your labs drawn today as well as a device check.     Please call us with questions, [phone], Threasa Beards RN, Moshe Salisbury RN, or Doroteo Bradford RN     Please allow 5-7 business days on all results. All normal results will go to MyChart. If you do not have Mychart, it is strongly recommended to get this so you can easily view all your results. If you do not have mychart, we will attempt to call you once with normal lab and testing results. If we cannot reach you by phone with normal results, we will send you a letter. We will always call you with abnormal lab/test results.

## 2019-06-27 NOTE — Telephone Encounter
Pt would like his pre cath COVID testing completed closer to home. Called Dr. Vincente Poli office and they transferred me to Denice Paradise, the infection control nurse. LMOM      Denice Paradise called back and left message that I can fax the order for the COVID test to [phone]  She states states it's a 3-4 day turn around.   She did not leave instructions as to how the patient is supposed to schedule to testing.   Called Dr. Vincente Poli office back and LM asking for instructions on how pt is supposed to schedule COVID test    Lab order faxed to fax number provided

## 2019-06-27 NOTE — Patient Instructions
CARDIAC CATHETERIZATION   PRE-ADMISSION INSTRUCTIONS    Patient Name: Jonathan Humphrey  MRN#: [id number]  Date of Birth: [birth year] (85 y.o.)  Today's Date: 06/27/2019    PROCEDURE:  You are scheduled for a Coronary Angiogram with possible Angioplasty/Stenting with Interventional Cardiologist.    PROCEDURE DATE AND ARRIVAL TIME:    Your procedure date is July 05, 2019 .  You will receive a call from the Cath lab staff between 8:00 a.m. and noon on the business day prior to your procedure to let you know at what time to arrive on the day of your procedure.    Please check in at the Admitting Desk in the [facility] for your procedure. [facility] Entrance and and take a right. Continue down the hallway past [facility] office. That hall will take [facility] into the [facility]. Check in at the desk on the left side.)     (If you have further questions regarding your arrival time for the CV lab, please call [phone] by 3:00pm the day before your procedure. Please leave a message with your name and number, your call will be returned in a timely manner.)          SPECIAL MEDICATION INSTRUCTIONS  Nothing to eat after midnight before your procedure. No caffeine for 24 hours prior to your procedure. You will be under moderate sedation for your procedure.  You may drink clear liquids up to an hour before hospital arrival. This will be confirmed by the Cath lab staff the day before your procedure.        Any new prescriptions will be sent to your pharmacy listed on file with [place].   HOLD ALL over the counter vitamins or supplements on the morning of your procedure.  Please either take 4 baby aspirins (4 times 81mg ) or one full strength NON-COATED 325mg  aspirin.   Please take lisinopril, metoprolol, and Prilosec the morning of the procedure.   HOLD ALL other medications the morning of.   Please hold your coumadin for 5 days prior.     Additional Instructions If you wear CPAP, please bring your mask and machine with you to the hospital.    Take a bath or shower with anti-bacterial soap the evening before, or the morning of the procedure.     Bring photo ID and your health insurance card(s).    Arrange for a driver to take you home from the hospital. Please arrange for a friend or family member to take you home from this test. You cannot take a Taxi, [employer], or public transportation as there has to be a responsible person to help care for you after sedation    Bring an accurate list of your current medications with you to the hospital (all medications and supplements taken daily).  Please use the medication list below and write in the date and time when you took your last dose before your procedure. Update this list of medications as needed.      Wear comfortable clothes and don't bring valuables, other than photo identification card, with you to the hospital.    Please pack a bag for an overnight stay.     Please review your pre-procedure instructions and bring them with you on the day of your procedure.  Call the office at [phone] with any questions. You may ask to speak with Dr. Ansel Bong nurse.        ALLERGIES  No Known Allergies    CURRENT  MEDICATIONS   [name], [name] Medication Instructions HAR:    Printed on:06/27/19 1406   Medication Information                      aspirin EC 81 mg tablet  Take 1 tablet by mouth daily. Take with food.             atorvastatin (LIPITOR) 40 mg tablet  Take one tablet by mouth at bedtime daily.             cholecalciferol (VITAMIN D-3) 1,000 units tablet  Take 1,500 Units by mouth daily.             ezetimibe (ZETIA) 10 mg tablet  Take one tablet by mouth daily.             ketoconazole (NIZORAL) 2 % topical cream  Apply  to affected area twice daily. to scaly areas on forehead and nose             levothyroxine (SYNTHROID) 50 mcg tablet  TAKE (1) TABLET BY MOUTH ONCE DAILY lisinopriL (ZESTRIL) 20 mg tablet  TAKE (1) TABLET BY MOUTH ONCE DAILY             metoprolol XL (TOPROL XL) 25 mg extended release tablet  Take one tablet by mouth every 12 hours.             OMEGA-3 FATTY ACIDS/FISH OIL (FISH OIL-OMEGA-3 FATTY ACIDS PO)  Take  by mouth daily. 2 tsp daily               omeprazole DR(+) (PRILOSEC) 20 mg capsule  Take one capsule by mouth daily.             tamsulosin (FLOMAX) 0.4 mg capsule  TAKE 2 CAPSULES 30 MINUTES FOLLOWING THE SAME MEAL DAILY. DO NOT CRUSH, CHEW OR OPEN CAPSULES.             warfarin (COUMADIN) 4 mg tablet  TAKE 1 TABLET DAILY OR AS INSTRUCTED BY DR. Vivianne Spence               _________________________________________  Form completed by: Dyke Maes, RN  Date completed: 06/27/19  Method: In person and given to the patient.

## 2019-06-28 ENCOUNTER — Encounter: Admit: 2019-06-28 | Discharge: 2019-06-28

## 2019-06-28 NOTE — Telephone Encounter
Denice Paradise, infection control nurse with [facility], called and we discussed that patient will have to have COVID test completed by 1400 on Friday July 10.   The pt will have to self isolate from Friday until his cath date on Wednesday.     Called pt and reviewed COVID test instructions. He verbalizes understanding and is agreeable with self isolation from Friday until Wednesday.     Denice Paradise with Infection control at [facility] can be contacted at [place]

## 2019-06-28 NOTE — Progress Notes
Medicare Primary No pre-certification is required.

## 2019-06-28 NOTE — Telephone Encounter
[facility] infection control nurse called and asked for a call back to discuss timing of COVID pre-screen and LM    Joelle from [facility] called and LM and state that pt can go to ER and check in for COVID testing. He needs to go in before 1400. The ER will register him with the lab to get swabbed.     Called infection control nurse back and LM asking for a call back.

## 2019-06-30 ENCOUNTER — Encounter: Admit: 2019-06-30 | Discharge: 2019-06-30

## 2019-07-03 NOTE — H&P (View-Only)
Patient presents for cardiac procedure. Please see previously completed H&P below.    Newton Pigg, APRN-C (pager [phone])    ______________________________________________     Reubin Milan, MD   Physician   Cardiovascular Disease   Progress Notes   Signed   Creation Time:  06/27/19 1330          Related encounter: Office Visit from 06/27/2019 in [facility]            Date of Service: 06/27/2019  ???  Ranjit Ganey Jonathan Humphrey is a 85 y.o. male.     ???  HPI      Mr. Okerson comes for followup.  I saw him about 7 months ago.  He is a very pleasant 85 year old gentleman.  He has a history of paroxysmal atrial fibrillation.  He has had previous AFib ablation.  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  He has had resection of his left atrial appendage.  He had angioplasty in 1995.  He has a permanent pacemaker for chronotropic incompetence.  He has had hypertension, hyperlipidemia, hypothyroidism, and gastroesophageal reflux.  He had a heart catheterization 2 years ago that showed only mild nonobstructive disease.  He has had normal heart function.  He had an MRI that did not show any delayed hyperenhancement.   ???  He was hospitalized on June the 17th with sustained VT which was detected by his pacemaker, but he did not have symptoms.  He had stopped his metoprolol just proximate to that event with some fatigue and tiredness and was concerned that that may have been the explanation.  He has only been on metoprolol 25 mg per day.  While in the hospital, there were no obvious electrolyte changes.  His echo showed normal ejection fraction with no change.  He had a coronary CT angiogram which was technically difficult to interpret due to the extensive calcification, and FFR was not performed.  He started back on his metoprolol.  He continues to have some fatigue and dyspnea with exertion.  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  He denies palpitations, presyncope, syncope, TIA, stroke, and claudication.  He has been no fever, chills, or sweats.  ???  ([id number])  ???  ???     ???  ???  ???  ???  ???      Vitals:   ??? 06/27/19 1324   BP: 126/68   BP Source: Arm, Left Upper   Pulse: 74   SpO2: 96%   Weight: 95.4 kg (210 lb 6.4 oz)   Height: 1.727 m (5' 8)   PainSc: Zero   ???  Body mass index is 31.99 kg/m???.   ???  Past Medical History        Patient Active Problem List   ??? Diagnosis Date Noted   ??? Rash 11/14/2018   ??? Head trauma, initial encounter 10/20/2018   ??? Carpal tunnel syndrome of left wrist 07/05/2018   ??? PTSD (post-traumatic stress disorder) 07/05/2018   ??? Benign prostatic hyperplasia with lower urinary tract symptoms 06/03/2017   ??? Shoulder arthritis 04/05/2017   ??? Trigger middle finger of right hand 12/09/2016   ??? Paroxysmal ventricular tachycardia (HCC) 08/13/2016   ??? Right inguinal hernia 12/06/2015   ??? Cardiac pacemaker in situ 10/18/2015   ??? ??? ??? 10/21/15 Medtronic dual-chamber PPM implantation - Dr. Naoma Diener  ???   ??? Chronotropic incompetence  with sinus node dysfunction (HCC) 10/18/2015   ??? BMI 31.0-31.9,adult 08/10/2015   ??? Primary osteoarthritis of left knee 06/04/2015   ??? AV block, 2nd degree 07/14/2010   ??? Leg cramps, sleep related 04/10/2010   ??? Aortic valve mass 03/28/2010   ??? ??? 05/14/2010: Pt. underwent resection of intracardiac mass from right coronary cusp of aortic valve and modified radiofrequency maze with bilateral pulmonary vein isolation and resection of left atrial appendage by Dr. Helen Hashimoto.   ???  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 ???Aortic valve function was preserved.  ???  ???   ??? Coronary artery disease, non-occlusive 04/11/2009   ??? ??? 05/10/17: Cath by Dr. Micheline Rough: 10-20% prox-CFX, 20-30% prox-RCA  ???   ??? HLD (hyperlipidemia) 04/11/2009   ??? Essential hypertension 04/11/2009   ??? GERD (gastroesophageal reflux disease) 04/11/2009 ??? 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 Erectile dysfunction of non-organic origin 04/11/2009   ??? Hypothyroid 04/11/2009   ??? PAF (paroxysmal atrial fibrillation) (HCC) 04/11/2009   ??? Chronic anticoagulation, on warfarin 04/11/2009   ???  ???  ???  Review of Systems   All other systems reviewed and are negative.     REVIEW OF SYSTEMS:  As documented in the database.  ???  ([id number])  ???  ???     ???  ???  ???  Physical Exam  On examination, he is in no distress.  He is 5 feet 8.  Weight is 210.  BMI is 31.9.  Blood pressure 126/68.  Pulse is regular at 75 beats per minute.  His rhythm is paced.  His venous pressure is normal.  Saturation is 96%.  There is no cyanosis.  There is no edema.  Lungs are clear.  There is no wheeze or rhonchi.  PMI is not felt.  Heart sounds are normal.  I do not hear any gallops, murmurs, or rubs.  There is no hepatomegaly.  There are no abdominal bruits.  Bowel sounds are normal.  There is no icterus.  There are no focal neurologic deficits.  Distal pulses are 2+ bilaterally.  There are no carotid bruits.  He is alert and oriented times 3.  His mood, judgment, and affect are normal.  ???  ([id number])  ???  ???     ???  ???  ???  Cardiovascular Studies  EKG shows ventricular-paced rhythm.  There has been no change.  ???  ([id number])  ???  ???     ???  ???  ???  Problems Addressed Today       Encounter Diagnoses   Name Primary?   ??? Coronary artery disease, non-occlusive Yes   ??? Essential hypertension ???   ??? Mixed hyperlipidemia ???   ??? PAF (paroxysmal atrial fibrillation) (HCC) ???   ??? Chronic anticoagulation, on warfarin ???   ??? 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   ??? Cardiac pacemaker in situ ???   ??? Paroxysmal ventricular tachycardia (HCC) ???   ???  ???  Assessment and Plan      Mr. Urwin had sustained ventricular tachycardia.  He was asymptomatic.  The cause of this is unclear.  It may have been related to discontinuation of his beta blocker.  He is now back on his metoprolol 25 mg b.i.d.  His ejection fraction remains normal.  I am not sure about his coronary status due to the extensive nature of the calcification.  I think he needs a diagnostic  invasive coronary angiogram to be sure that there is not progressive coronary disease to account for his symptoms of fatigue and dyspnea, as well as the arrhythmia.  I would like to check lab work today.  I would like to get a device check.  I would like to get him in for a coronary angiogram and possible intervention.  We talked about the procedure potential risks, including moderate conscious sedation and possible intervention.  I will keep you informed with the results.  We will continue to follow him closely.  He needs ongoing risk factor modification.  ???  ([id number])  ???  ???     He will need to hold his warfarin.  We will check his INR today.  I do not think he needs bridging.  He has not had any recent atrial fibrillation.  ???  ([id number])  ???  ???     ???  ???  ???  ???  ???  Current Medications (including today's revisions)  ??? aspirin EC 81 mg tablet Take 1 tablet by mouth daily. Take with food.   ??? atorvastatin (LIPITOR) 40 mg tablet Take one tablet by mouth at bedtime daily.   ??? cholecalciferol (VITAMIN D-3) 1,000 units tablet Take 1,500 Units by mouth daily.   ??? ezetimibe (ZETIA) 10 mg tablet Take one tablet by mouth daily.   ??? ketoconazole (NIZORAL) 2 % topical cream Apply  to affected area twice daily. to scaly areas on forehead and nose   ??? levothyroxine (SYNTHROID) 50 mcg tablet TAKE (1) TABLET BY MOUTH ONCE DAILY   ??? lisinopriL (ZESTRIL) 20 mg tablet TAKE (1) TABLET BY MOUTH ONCE DAILY   ??? metoprolol XL (TOPROL XL) 25 mg extended release tablet Take one tablet by mouth every 12 hours.   ??? OMEGA-3 FATTY ACIDS/FISH OIL (FISH OIL-OMEGA-3 FATTY ACIDS PO) Take  by mouth daily. 2 tsp daily  ???   ??? omeprazole DR(+) (PRILOSEC) 20 mg capsule Take one capsule by mouth daily.   ??? tamsulosin (FLOMAX) 0.4 mg capsule TAKE 2 CAPSULES 30 MINUTES FOLLOWING THE SAME MEAL DAILY. DO NOT CRUSH, CHEW OR OPEN CAPSULES.   ??? warfarin (COUMADIN) 4 mg tablet TAKE 1 TABLET DAILY OR AS INSTRUCTED BY DR. Vivianne Spence (Patient taking differently: Take 6 mg on Tuesdays and a 4 mg tablet all other days of the week AS INSTRUCTBY DR. Parke Simmers)   ???  ???        Revision History

## 2019-07-04 NOTE — Discharge Instructions - Pharmacy
Physician Discharge Summary      Name: Jonathan Humphrey  Medical Record Number: [id number]        Account Number:  [id number]  Date Of Birth:  [birth year]                         Age:  85 years   Admit date:  07/05/2019                     Discharge date:  07/05/2019    Attending Physician:  Dr. Harley Alto, MD               Service: Med-Cardiovasc    Physician Summary completed by: Jearld Lesch, APRN-NP     Reason for hospitalization: left cardiac catheterization     Significant PMH:   Medical History:   Diagnosis Date   ??? AK (actinic keratosis)     scalp   ??? Arthritis    ??? Atrial fibrillation (HCC) 01/16/2009   ??? Chronic anticoagulation 01/16/2009   ??? Coronary atherosclerosis 09/01/2006    Coronary artery disease   ??? Cyst     right upper back   ??? Dizziness 01/19/2007   ??? 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 Essential hypertension 04/11/2009   ??? Family history of cerebrovascular accident (CVA) 01/19/2007   ??? Fatigue 07/21/2007   ??? Generalized headaches    ??? GERD (gastroesophageal reflux disease) 12/30/2006   ??? Hearing loss 01/19/2007   ??? HLD (hyperlipidemia) 04/11/2009   ??? Hyperlipidemia 09/01/2006   ??? Hypertension, essential 09/01/2006   ??? Hypothyroidism 11/30/2007   ??? Intertrigo    ??? Knee pain 12/30/2006   ??? Neoplasm of uncertain behavior    ??? Onychomycosis    ??? PAF (paroxysmal atrial fibrillation) (HCC) 04/11/2009   ??? Photoaging of skin    ??? Seasonal allergic reaction    ??? Sinus bradycardia    ??? SK (seborrheic keratosis)     face, scalp, right ear   ??? SSS (sick sinus syndrome) (HCC)    ??? Tinea pedis    ??? Verruca vulgaris        Allergies: Patient has no known allergies.    Admission Lab/Radiology studies notable for:   Hematology:    Lab Results   Component Value Date    HGB 16.2 06/27/2019    HCT 46.3 06/27/2019    PLTCT 177 06/27/2019    WBC 6.5 06/27/2019    NEUT 68 06/27/2019    ANC 4.42 06/27/2019    ALC 1.19 06/27/2019    MONA 8 06/27/2019    AMC 0.55 06/27/2019    ABC 0.05 06/27/2019 MCV 99.8 06/27/2019    MCHC 34.9 06/27/2019    MPV 8.1 06/27/2019    RDW 13.4 06/27/2019   , General Chemistry:    Lab Results   Component Value Date    NA 137 06/27/2019    K 4.1 06/27/2019    CL 104 06/27/2019    GAP 9 06/27/2019    BUN 17 06/27/2019    CR 0.83 06/27/2019    GLU 95 06/27/2019    GLU 94 01/06/2006    CA 9.4 06/27/2019    ALBUMIN 4.4 06/27/2019    MG 2.1 06/08/2019    TOTBILI 1.1 06/27/2019   , HgbA1C:   Lab Results   Component Value Date    HGBA1C 5.6 01/03/2019    and Lipid Profile:   Lab Results  Component Value Date    CHOL 111 06/08/2019    TRIG 89 06/08/2019    HDL 44 06/08/2019    LDL 58 06/08/2019    VLDL 18 06/08/2019        Brief Hospital Course: Jonathan Humphrey is a 85 y.o. 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  Patient was recently hospitalized in June 2020 after a device interrogation revealed a 15-second run of NSVT.  Patient had a coronary CTA done which showed extensive coronary artery calcifications. Patient noted to have had a cardiac catheterization done in 2018 that revealed mild nonobstructive coronary artery disease.  Hospital follow-up with Dr. Vivianne Spence patient reported ongoing fatigue and dyspnea on exertion.  Decision made to pursue cardiac catheterization to further define coronary anatomy.    LHC showed mild coronary artery disease with no significant change from previous study. Patient is to continue aspirin 81 mg daily indefinitely and resumed on warfarin 6 hours post hemostasis.  Continued on statin and beta-blocker.    He is a candidate for ongoing risk factor and medical management. Heart healthy diet, exercise, and weight loss were encouraged.    His post procedure course was uncomplicated and he was discharged home in stable condition Upon discharge the patient and family were given post procedure instructions/restrictions as well as written instructions ( see Discharge instructions)    ____________________________________________________________________________________    DISCHARGE PLAN:    Follow up: Follow-up with Dr. Vivianne Spence in 6 weeks requested    Antiplatelet/Anticoagulation: ASA 81 mg EC daily indefinitely & warfarin 4 mg daily.  Patient has to take 5 mg of warfarin on 7/15 evening and then asked to resume 4 mg daily on 7/16.     Labs:  He will recheck INR on 7/17 at home and follow-up with managing provider for further warfarin recommendations.      DISEASE PREVENTION:    Lipids: LDL 58.  Continued on PTA Lipitor 40 mg daily and Zetia.     HTN: Blood pressure adequately controlled.      Diabetes: Patient is not a diabetic.  HgA1C 5.6.  Patient encouraged to follow up with primary care provider for further evaluation/ management.     Tobacco: Patient is not a tobacco user.     Obesity: Body mass index is 30.23 kg/m???..  Patient will continue to manage through diet and exercise.  ____________________________________________________________________________________      Condition at Discharge: stable. right radial D/I, without evidence of hematoma, bleeding, or bruit. Distal pulses intact. Pt was ambulatory in the halls without complaint prior to discharge. BP 122/76  - Pulse 62  - Temp 36.6 ???C (97.9 ???F)  - Ht 1.727 m (5' 8)  - Wt 90.2 kg (198 lb 12.8 oz)  - SpO2 97%  - BMI 30.23 kg/m???     Hospital Problems        Active Problems    * (Principal) Coronary artery disease, non-occlusive    HLD (hyperlipidemia)    Essential hypertension    PAF (paroxysmal atrial fibrillation) (HCC)    Chronic anticoagulation, on warfarin        Significant Diagnostic Studies and Procedures:   LHC 07/05/19:  IMPRESSION:    1. Mild coronary artery disease as described above, unchanged from prior study.  2. Normal LVEDP. 3. No significant gradient across the aortic valve on pullback.  RECOMMENDATIONS:  Continue medical  therapy and aggressive lifestyle risk factor modification.    Consults:  None    Patient Disposition: Home       Patient instructions/medications:       PROTIME INR (PT)   Standing Status: Future Standing Exp. Date: 07/04/20    Call managing provider with results for further recommendations regarding warfarin.     Cardiac Diet    Limiting unhealthy fats and cholesterol is the most important step you can take in reducing your risk for cardiovascular disease.  Unhealthy fats include saturated and trans fats.  Monitor your sodium and cholesterol intake.  Restrict your sodium to 2g (grams) or 2000mg  (milligrams) daily, and your cholesterol to 200mg  daily.    If you have questions regarding your diet at home, you may contact a dietitian at [phone].       Report These Signs and Symptoms    Please contact your doctor if you have any of the following symptoms: Chest pain, shortness of breath, lightheadedness, dizziness, near fainting, palpitations, abd pain, back pain, or bleeding.     Risk Reduction Plan    Cardiac Event Personal Risk Factor Reduction Plan  **Take this sheet to your physician to show treatment recommendations**  Jonathan Humphrey  Admission Date: (Not on file) LOS: @LOS @       Blood Pressure Risk  Goal: Keep blood pressure below 130/80  Your Numbers:  BP Readings from Last 1 Encounters:  06/27/19 : 126/68     Plan: High blood pressure is the single most important risk factor for cardiac events because it's the #1 cause of cardiac events.  Take medication as prescribed and monitor your blood pressure.    Abnormal lipids (fats in blood) Risk   Goal: Total Cholesterol: <200  LDL (bad cholesterol): primary prevention <100   LDL (bad cholesterol): secondary prevention < 70  HDL (good cholesterol): >40 for men, >50 for women  Triglycerides: <150  Your Numbers: Cholesterol Date                     Value               Ref Range           Status                06/08/2019               111                 <200 MG/DL          Final            ----------  LDL       Date                     Value               Ref Range           Status                06/08/2019               58                  <100 mg/dL          Final            ----------  HDL       Date  Value               Ref Range           Status                06/08/2019               44                  >40 MG/DL           Final            ----------  Triglycerides       Date                     Value               Ref Range           Status                06/08/2019               89                  <150 MG/DL          Final            ----------  Plan: Diets high in saturated fat, trans fat and cholesterol can raise blood cholesterol levels increasing your risk of having a cardiac event.  Take medication as prescribed and eat a heart healthy, low sodium diet.    Smoking Risk   Goals: If you smoke, STOP!   Plan: Smoking DOUBLES your risk of having a cardiac event. Quitting can greatly reduce your risk. To register for smoking cessation program call [phone] or visit www.smokefree.gov    Diabetes Risk   Goal: Non-diabetic: Below 5.7%  Goal for diabetic: Less than 7%  Your Numbers: Hemoglobin A1C       Date                     Value               Ref Range           Status                01/03/2019               5.6                 4.0 - 6.0 %         Final              Comment:    The ADA recommends that most patients with type 1 and type 2 diabetes maintain     an A1c level <7%.      ----------  Plan: If you have diabetes, even if treated, you are at an increased risk of having a cardiac event.     Alcohol Use Risk  Goal: Alcohol use can lead to a cardiac event.  Plan: For men, limit intake to no more than 2 drinks per day.  For women, limit intake to no more than one drink per day. Weight Management Risk   Goal: Healthy: BMI is 18.5 to 24.9  Overweight: BMI is 25 to 29.9  Obese: BMI is 30 or higher  Morbid Obesity: BMI [...]  Your Numbers: Your    Plan: If you have questions about your  diet after you go home, you can call a dietitian at [phone]    Physical Activity Risk   Goal: Patients should have approval by a physician prior to beginning an exercise program.  Plan: Try to get at least 30 minutes of moderate physical activity five days a week or 20 minutes of vigorous physical activity three days a week with your doctor's approval.    To the Neurology and the NeuroSurg Discharge order sets set add a new order in the education section titled Risk Reduction Plan, in the comments section add the following (default select this new order for neuro and neuro surg and ensure this information is added to the AVS).     Questions About Your Stay    For questions or concerns regarding your hospital stay:    - DURING BUSINESS HOURS (8:00 AM - 4:30 PM):    Call [phone] and asked to be transferred to your discharge attending physician.    - AFTER BUSINESS HOURS (4:30 PM - 8:00 AM, on weekends, or holidays):  Call [phone] and ask the operator to page the on-call doctor for the discharge attending physician.     Discharging attending physician: Harley Alto [[id number]]      Procedure Specific Activity    *May return to work/school in 2 days.  *May shower after discharge.  *NO lifting, pushing or pulling more than 5 pounds for one week to affected hand. You may use your wrist splint for a reminder!  *NO strenuous activity for 1 week.  *NO driving for 2 days.  *NO baths or swimming for 1 week.  *NO sexual activity for 1 week.     Incision Care    *Call if there is an increase in pain, swelling, or redness.  *DO NOT soak incision in water.  *NO tub baths, hot tubs, or swimming.  *You may shower after discharge.     Request for Cardiology Appointment Standing Status: Future Standing Exp. Date: 07/04/20     Scheduling Priority: Routine    Schedule OV with (1st choice Provider) Lazarus Gowda M.D.    Location of Appointment [facility] / Mon-Fri       Current Discharge Medication List       CONTINUE these medications which have NOT CHANGED    Details   aspirin EC 81 mg tablet Take 1 tablet by mouth daily. Take with food.  Qty: 90 tablet, Refills: 3    PRESCRIPTION TYPE:  OTC      atorvastatin (LIPITOR) 40 mg tablet Take one tablet by mouth at bedtime daily.  Qty: 90 tablet, Refills: 1    PRESCRIPTION TYPE:  Normal  Associated Diagnoses: Coronary artery disease, non-occlusive; Mixed hyperlipidemia; Atrial fibrillation, unspecified type (HCC); Hypothyroidism, unspecified type      cholecalciferol (VITAMIN D-3) 1,000 units tablet Take 1,500 Units by mouth daily.    PRESCRIPTION TYPE:  Historical Med      ezetimibe (ZETIA) 10 mg tablet Take one tablet by mouth daily.  Qty: 90 tablet, Refills: 1    PRESCRIPTION TYPE:  Normal  Associated Diagnoses: Coronary artery disease, non-occlusive; Mixed hyperlipidemia; Atrial fibrillation, unspecified type (HCC); Hypothyroidism, unspecified type      ketoconazole (NIZORAL) 2 % topical cream Apply  to affected area twice daily. to scaly areas on forehead and nose  Qty: 60 g, Refills: 3    PRESCRIPTION TYPE:  Normal      levothyroxine (SYNTHROID) 50 mcg tablet TAKE (1) TABLET BY MOUTH ONCE DAILY  Qty:  90 tablet, Refills: 1    PRESCRIPTION TYPE:  Normal  Associated Diagnoses: Coronary artery disease, non-occlusive; Mixed hyperlipidemia; Atrial fibrillation, unspecified type (HCC); Hypothyroidism, unspecified type      lisinopriL (ZESTRIL) 20 mg tablet TAKE (1) TABLET BY MOUTH ONCE DAILY  Qty: 90 tablet, Refills: 2    PRESCRIPTION TYPE:  Normal      metoprolol XL (TOPROL XL) 25 mg extended release tablet Take one tablet by mouth every 12 hours.  Qty: 180 tablet, Refills: 3    PRESCRIPTION TYPE:  Normal Comments: Pt was discharge on this medication yesterday from [facility] and he is taking it bid but only 90 tablets were ordered. Can he get the other 90 tabs mailed out to him to make a full 90 day prescription please?      OMEGA-3 FATTY ACIDS/FISH OIL (FISH OIL-OMEGA-3 FATTY ACIDS PO) Take  by mouth daily. 2 tsp daily      PRESCRIPTION TYPE:  Historical Med      omeprazole DR(+) (PRILOSEC) 20 mg capsule Take one capsule by mouth daily.  Qty: 90 capsule, Refills: 1    PRESCRIPTION TYPE:  Normal  Associated Diagnoses: Coronary artery disease, non-occlusive; Mixed hyperlipidemia; Atrial fibrillation, unspecified type (HCC); Hypothyroidism, unspecified type      tamsulosin (FLOMAX) 0.4 mg capsule TAKE 2 CAPSULES 30 MINUTES FOLLOWING THE SAME MEAL DAILY. DO NOT CRUSH, CHEW OR OPEN CAPSULES.  Qty: 180 capsule, Refills: 1    PRESCRIPTION TYPE:  Normal      warfarin (COUMADIN) 4 mg tablet TAKE 1 TABLET DAILY OR AS INSTRUCTED BY DR. Merry Lofty: 90 tablet, Refills: 3    PRESCRIPTION TYPE:  Normal  Associated Diagnoses: Aortic valve mass           Scheduled appointments:    Sep 12, 2019 10:40 AM CDT  Return Patient with Roderic Scarce Al-Hihi, MD  Internal Medicine (Internal Medicine) [address]  [place] [address]  [phone]          Pending items needing follow up: as above.     Signed:  Jearld Lesch, APRN-NP  07/05/2019      cc:  Primary Care Physician:  Al-Hihi, Roderic Scarce   Verified  Referring physicians:  Lessie Dings, MD   Additional provider(s):

## 2019-07-04 NOTE — Progress Notes
Cardiovascular Labs Scheduling Checklist   1. Date of procedure:07-15    2. Arrival time: 0840    3. Patient instructed NPO after MN; clear liquids until: 0740    4. Instructed to check-in at the heart hospital registration desk and bring photo id, insurance cards and a current list of home medications.  Pack an overnight bag in the event you are admitted overnight:Y     5. If you have a history of sleep apnea and use a C-Pap or Bi-Pap machine, please bring it with you to the hospital:N    6. Have a driver available upon discharge as you may not be cleared to drive for 48 hours or more post procedure. (exception is RHC/biopsy patient with internal jugular approach not receiving sedation): Y    7. If the patient's language preference is other than English, please indicated native language and need for interpreter:  N    8. Patient instructed to drink 64 oz water the day before their procedure if applicable and not contraindicated: Y    9. Patient instructed no caffeine for 24 hours before procedure: Y    10. Pre-procedure medications reviewed:   Hold the following Medications: Continue to take the following:                     11. Contrast allergy with need for contrast allergy prophylaxis: N    12. Patient instructed to bath with antibacterial soap or surgical scrub:Y    13. List all same day pre-procedure requirements, i.e. Labs/H&P/EKG:     14. List any same day pre-procedure appointments:N    15. List any isolation precautions and organism:N    16. List any special considerations: N    17. Patient instructed to prepare for delays as there may be urgent or emergent cases: Y    18. Updated visitor guidelines reviewed  (1 visitor, only allowed in waiting room):Y    19. Patient acknowledges understanding of pre-procedure instructions: Jonathan Humphrey

## 2019-07-05 ENCOUNTER — Encounter: Admit: 2019-07-05 | Discharge: 2019-07-05

## 2019-07-05 ENCOUNTER — Ambulatory Visit: Admit: 2019-07-05 | Discharge: 2019-07-05

## 2019-07-05 DIAGNOSIS — L578 Other skin changes due to chronic exposure to nonionizing radiation: Secondary | ICD-10-CM

## 2019-07-05 DIAGNOSIS — I4891 Unspecified atrial fibrillation: Secondary | ICD-10-CM

## 2019-07-05 DIAGNOSIS — D489 Neoplasm of uncertain behavior, unspecified: Secondary | ICD-10-CM

## 2019-07-05 DIAGNOSIS — B351 Tinea unguium: Secondary | ICD-10-CM

## 2019-07-05 DIAGNOSIS — J302 Other seasonal allergic rhinitis: Secondary | ICD-10-CM

## 2019-07-05 DIAGNOSIS — R001 Bradycardia, unspecified: Secondary | ICD-10-CM

## 2019-07-05 DIAGNOSIS — M1990 Unspecified osteoarthritis, unspecified site: Secondary | ICD-10-CM

## 2019-07-05 DIAGNOSIS — B353 Tinea pedis: Secondary | ICD-10-CM

## 2019-07-05 DIAGNOSIS — E039 Hypothyroidism, unspecified: Secondary | ICD-10-CM

## 2019-07-05 DIAGNOSIS — R5383 Other fatigue: Secondary | ICD-10-CM

## 2019-07-05 DIAGNOSIS — Z7901 Long term (current) use of anticoagulants: Secondary | ICD-10-CM

## 2019-07-05 DIAGNOSIS — M25569 Pain in unspecified knee: Secondary | ICD-10-CM

## 2019-07-05 DIAGNOSIS — Z823 Family history of stroke: Secondary | ICD-10-CM

## 2019-07-05 DIAGNOSIS — L821 Other seborrheic keratosis: Secondary | ICD-10-CM

## 2019-07-05 DIAGNOSIS — I10 Essential (primary) hypertension: Secondary | ICD-10-CM

## 2019-07-05 DIAGNOSIS — I495 Sick sinus syndrome: Secondary | ICD-10-CM

## 2019-07-05 DIAGNOSIS — I480 Paroxysmal atrial fibrillation: Secondary | ICD-10-CM

## 2019-07-05 DIAGNOSIS — L304 Erythema intertrigo: Secondary | ICD-10-CM

## 2019-07-05 DIAGNOSIS — H9190 Unspecified hearing loss, unspecified ear: Secondary | ICD-10-CM

## 2019-07-05 DIAGNOSIS — L570 Actinic keratosis: Secondary | ICD-10-CM

## 2019-07-05 DIAGNOSIS — I2510 Atherosclerotic heart disease of native coronary artery without angina pectoris: Principal | ICD-10-CM

## 2019-07-05 DIAGNOSIS — F5221 Male erectile disorder: Secondary | ICD-10-CM

## 2019-07-05 DIAGNOSIS — E785 Hyperlipidemia, unspecified: Secondary | ICD-10-CM

## 2019-07-05 DIAGNOSIS — R0609 Other forms of dyspnea: Secondary | ICD-10-CM

## 2019-07-05 DIAGNOSIS — R51 Headache: Secondary | ICD-10-CM

## 2019-07-05 DIAGNOSIS — R42 Dizziness and giddiness: Secondary | ICD-10-CM

## 2019-07-05 DIAGNOSIS — I472 Ventricular tachycardia: Secondary | ICD-10-CM

## 2019-07-05 DIAGNOSIS — K219 Gastro-esophageal reflux disease without esophagitis: Secondary | ICD-10-CM

## 2019-07-05 DIAGNOSIS — Z1159 Encounter for screening for other viral diseases: Secondary | ICD-10-CM

## 2019-07-05 DIAGNOSIS — B078 Other viral warts: Secondary | ICD-10-CM

## 2019-07-05 LAB — POC PT/INR: Lab: 1 (ref 0.8–1.2)

## 2019-07-05 MED ORDER — ACETAMINOPHEN 325 MG PO TAB
650 mg | ORAL | 0 refills | Status: DC | PRN
Start: 2019-07-05 — End: 2019-07-06

## 2019-07-05 MED ORDER — NITROGLYCERIN 0.4 MG SL SUBL
.4 mg | SUBLINGUAL | 0 refills | Status: DC | PRN
Start: 2019-07-05 — End: 2019-07-06

## 2019-07-05 MED ORDER — SODIUM CHLORIDE 0.9 % IV SOLP
250 mL | INTRAVENOUS | 0 refills | Status: DC
Start: 2019-07-05 — End: 2019-07-05

## 2019-07-05 MED ORDER — TEMAZEPAM 15 MG PO CAP
15 mg | Freq: Every evening | ORAL | 0 refills | Status: DC | PRN
Start: 2019-07-05 — End: 2019-07-06

## 2019-07-05 MED ORDER — SODIUM CHLORIDE 0.9 % IV SOLP
1000 mL | INTRAVENOUS | 0 refills | Status: DC
Start: 2019-07-05 — End: 2019-07-06
  Administered 2019-07-05: 15:00:00 1000 mL via INTRAVENOUS

## 2019-07-05 MED ORDER — DOCUSATE SODIUM 100 MG PO CAP
100 mg | Freq: Every day | ORAL | 0 refills | Status: DC | PRN
Start: 2019-07-05 — End: 2019-07-06

## 2019-07-05 MED ORDER — ALUMINUM-MAGNESIUM HYDROXIDE 200-200 MG/5 ML PO SUSP
30 mL | ORAL | 0 refills | Status: DC | PRN
Start: 2019-07-05 — End: 2019-07-06

## 2019-07-05 NOTE — Progress Notes
Patient discharged to home with all belongings.  Discharge instructions, med reconciliation and home wound care instructions given and explained to patient and family both verbally and written.  Accompanied by Benjamine Mola, RN.  No complaints of pain or discomfort.   Right radial site  remains clean, dry, and intact with no evidence of a bleeding or hematoma after ambulation.  Patient escorted to lobby via Family.  Patient to follow up with [facility]) or on-call physician with any additional questions or concerns.  All contact numbers provided.  Patient and family acceptant of DC instuctions and report understanding to all information.

## 2019-07-05 NOTE — Progress Notes
Patient reports to have taken 325mg  ASA this morning.

## 2019-07-05 NOTE — Progress Notes
Patient arrived on unit via ambulation accompanied by RN. Patient transferred to the bed without assistance. Frailty score equals 3  Assessment completed, refer to flowsheet for details. Orders released, reviewed, and implemented as appropriate. Oriented to surroundings, call light within reach. Plan of care reviewed.  Will continue to monitor and assess.

## 2019-07-05 NOTE — Patient Instructions
Manual Sheath Removal From A Large Vein Or Artery-TUKH  When you go home:  ??? You may shower 24 hours after your procedure.  ??? Do not sit in water for one week. (No bath tub, swimming pool/hot tub, etc.)  ??? Keep the area clean and dry for one week (except for daily showers).  ??? Be sure your hands are clean when touching near the site.  ??? If a band-aid or dressing is still in place remove it before showering.  ??? Wash and dry thoroughly but gently.  ??? If needed, for your comfort, you may place a clean band-aid over the puncture site after you are clean and dry. It is best to leave it open to air as soon as it is comfortable to do so.  ??? Do not use ointments, creams, or powders on puncture site.  ??? Inspect site daily.???  Post Procedure Radiation Exposure  You???just completed a procedure which exposed you to radiation levels that may result in skin injury such as reddening of the skin which should fade with time, itching or temporary hair loss around chest or back.?????????When you go home you are advised to do the following:  ? Perform self-examination of your chest or back area or have a partner assist you for 2-10 weeks after your procedure.  Activity: (Unless otherwise instructed or unable to perform)  ??? Avoid any exertion for one week. Exertion is lifting over 15 lbs or pushing, pulling or straining.  ??? Avoid excessive bending, stooping, or stair climbing for 2 days. It is ok to go up stairs or bend over but take it slowly and keep it to a minimum.  ??? You may be up and about while relaxing at home as you recover.  ??? You may resume sexual activity in one week.  ??? You may begin driving 2 days after your procedure if you are otherwise able to drive.???  ---It is common to have mild soreness and/or a small, soft bruise around the site that can take up to two weeks to go away. A small (dime to quarter sized) lump is also normal. A small amount of blood (not more than a teaspoon) from the site is also common.??? When to call the Doctor: Complications are rare, but can happen.  ??? If you have significant bleeding (more than a teaspoon) or a lump underneath the skin (bigger than a golf ball) at the site lie down, apply firm pressure at the site and call 911. Bleeding from a large vessel needs professional help.  ??? If you have signs of infection at the site such as: redness, warm to touch, drainage, increasing soreness, a fever (100 degrees or more) and/or chills.  ??? Soreness that continues more than a week or unusual pain at the puncture site.  ??? Numbness, tingling, weakness in the affected leg.  ??? If your leg becomes cold and pale.  ??? If you have changes of vision, slurred speech or one-sided weakness.???  ??? If you have prolonged skin redness (similar to a sunburn) and/or itching around chest or back.???  ??? If you have blistering around chest or back.  Who do I contact if I need to speak with someone?  During Business Hours:  ??? [place] [facility] Office at the [facility]: [phone] [facility])  ??? [place]: [phone] (Monday-Friday)  ??? [place]: [phone] (Monday-Friday)  ??? [place]: [phone] (Tuesday and Thursday)  ??? [place]: [phone] (Monday-Friday)  ??? [facility]: [phone] (Tuesday, Wednesday and Friday)  ???  [place]: [phone] (Tuesday, Wednesday and Friday)  Nights and Weekends  ??? [place] [facility] Office at the [facility]: [phone]???  This education is meant to serve as a resource to you and your family. It is not meant to be all inclusive. The members of the [facility] at [facility], [phone], will be glad to answer any questions you may have about this booklet or your procedure.

## 2019-07-18 ENCOUNTER — Encounter: Admit: 2019-07-18 | Discharge: 2019-07-18

## 2019-07-18 DIAGNOSIS — E782 Mixed hyperlipidemia: Secondary | ICD-10-CM

## 2019-07-18 DIAGNOSIS — E039 Hypothyroidism, unspecified: Secondary | ICD-10-CM

## 2019-07-18 DIAGNOSIS — I4891 Unspecified atrial fibrillation: Secondary | ICD-10-CM

## 2019-07-18 DIAGNOSIS — I2510 Atherosclerotic heart disease of native coronary artery without angina pectoris: Secondary | ICD-10-CM

## 2019-07-18 MED ORDER — LEVOTHYROXINE 50 MCG PO TAB
ORAL_TABLET | Freq: Every day | ORAL | 1 refills | 30.00000 days | Status: DC
Start: 2019-07-18 — End: 2020-02-26

## 2019-07-18 NOTE — Telephone Encounter
Refill Request received.    Patient last seen 02/28/2019.    Follow up appt scheduled for 09/12/2019.    3 months and 1 refill e-scribed per protocol.    TSH   Lab Results   Component Value Date/Time    TSH 1.03 06/07/2019 02:00 PM

## 2019-07-20 ENCOUNTER — Encounter: Admit: 2019-07-20 | Discharge: 2019-07-20

## 2019-07-20 DIAGNOSIS — I480 Paroxysmal atrial fibrillation: Secondary | ICD-10-CM

## 2019-07-20 NOTE — Telephone Encounter
I spoke to Jonathan Humphrey, patient is at the grocery store. She will ask for patient to have INR checked and call in results.

## 2019-07-20 NOTE — Progress Notes
Pt called and LM with an INR of 3.9  Called pt back and provided warfarin dosing instructions. Asked that he have some greens tonight. He verbalizes understanding of plan.

## 2019-08-03 ENCOUNTER — Ambulatory Visit: Admit: 2019-08-03 | Discharge: 2019-08-04

## 2019-08-03 ENCOUNTER — Encounter: Admit: 2019-08-03 | Discharge: 2019-08-03

## 2019-08-03 DIAGNOSIS — Z950 Presence of cardiac pacemaker: Secondary | ICD-10-CM

## 2019-08-04 DIAGNOSIS — I441 Atrioventricular block, second degree: Principal | ICD-10-CM

## 2019-08-07 ENCOUNTER — Encounter: Admit: 2019-08-07 | Discharge: 2019-08-07 | Payer: MEDICARE

## 2019-08-07 NOTE — Progress Notes
Pt is not sure why his INR is elevated other than its a "new bottle". He will hold tonight and take only 2mg  tomorrow and check it on Wednesday.

## 2019-08-10 ENCOUNTER — Encounter: Admit: 2019-08-10 | Discharge: 2019-08-10

## 2019-08-10 DIAGNOSIS — I480 Paroxysmal atrial fibrillation: Secondary | ICD-10-CM

## 2019-08-10 NOTE — Progress Notes
Pt/MM

## 2019-08-17 ENCOUNTER — Encounter: Admit: 2019-08-17 | Discharge: 2019-08-17

## 2019-08-17 NOTE — Progress Notes
08/17/2019 10:37 AM  INR 2.8 pt agreeable to 4mg  daily and 2mg  two days per week. Recheck one week

## 2019-08-23 ENCOUNTER — Encounter: Admit: 2019-08-23 | Discharge: 2019-08-23

## 2019-08-23 DIAGNOSIS — I480 Paroxysmal atrial fibrillation: Secondary | ICD-10-CM

## 2019-08-23 NOTE — Progress Notes
Patient at the grocery store. Wife took number and will have patient call us back. Patient also needs an appointment.

## 2019-08-23 NOTE — Progress Notes
I spoke to patient and gave recommendations and confirmed 9/22 Ov with J.Tibke at 0900 prior to PCP OV

## 2019-09-06 ENCOUNTER — Encounter: Admit: 2019-09-06 | Discharge: 2019-09-06 | Payer: MEDICARE

## 2019-09-06 NOTE — Progress Notes
Unable to reach patient by phone or able to leave a VM. Sent mychart message with instructions. Reminder sent to tomorrow if patient doesn't read mychart.

## 2019-09-08 ENCOUNTER — Encounter: Admit: 2019-09-08 | Discharge: 2019-09-08 | Payer: MEDICARE

## 2019-09-08 NOTE — Progress Notes
Pt hospitalized since last office visit: No    Health Maintenance Due   Topic Date Due    DTAP/TDAP VACCINES (1 - Tdap) 03/07/1952    SHINGLES RECOMBINANT VACCINE (1 of 2) 03/07/1986    PHYSICAL (COMPREHENSIVE) EXAM  01/04/2019       There are no diagnoses linked to this encounter.    Labs not done:      Lab Frequency Next Occurrence   REQUEST FOR CARDIOLOGY APPOINTMENT Once 08/04/2016   REQUEST FOR CARDIOLOGY APPOINTMENT Once 12/09/2017   CBC AND DIFF Once Q000111Q   BASIC METABOLIC PANEL Once Q000111Q   LIPID PROFILE Once 04/04/2019   LIVER FUNCTION PANEL Once 04/04/2019   TSH WITH FREE T4 REFLEX Once 04/04/2019   HEMOGLOBIN A1C Once 04/04/2019   REQUEST FOR CARDIOLOGY APPOINTMENT Once 07/07/2019   COVID-19 (SARS-COV-2) PCR Once 06/27/2019   REQUEST FOR CARDIOLOGY APPOINTMENT Once 08/05/2019   PROTIME INR (PT) Once 07/07/2019   DEVICE EVALUATION - REMOTE PPM         MyChart message sent to patient on 09/18/20Angie Glennon Mac, RN     Notes to provider:

## 2019-09-12 ENCOUNTER — Encounter: Admit: 2019-09-12 | Discharge: 2019-09-12 | Payer: MEDICARE

## 2019-09-12 ENCOUNTER — Ambulatory Visit: Admit: 2019-09-12 | Discharge: 2019-09-12 | Payer: MEDICARE

## 2019-09-12 LAB — PROTIME INR (PT): Lab: 2.1 — ABNORMAL HIGH (ref 0.8–1.2)

## 2019-09-12 LAB — LIPID PROFILE
Lab: 123 mg/dL (ref <200)
Lab: 64 mg/dL — ABNORMAL HIGH (ref <100)
Lab: 77 mg/dL (ref 0.4–1.24)
Lab: 91 mg/dL (ref <150)

## 2019-09-12 LAB — LIVER FUNCTION PANEL
Lab: 0.2 mg/dL — ABNORMAL LOW (ref <0.4)
Lab: 1 mg/dL (ref 0.3–1.2)
Lab: 22 U/L (ref 7–40)
Lab: 29 U/L (ref 7–56)
Lab: 4 g/dL (ref >60)
Lab: 44 U/L (ref 25–110)
Lab: 6.4 g/dL (ref 6.0–8.0)

## 2019-09-12 LAB — BASIC METABOLIC PANEL
Lab: 139 MMOL/L (ref 137–147)
Lab: 4.2 MMOL/L (ref 3.5–5.1)

## 2019-09-12 LAB — HEMOGLOBIN A1C: Lab: 5.8 % (ref 4.0–6.0)

## 2019-09-12 LAB — CBC AND DIFF
Lab: 0 10*3/uL (ref 0–0.20)
Lab: 0.2 10*3/uL (ref 0–0.45)
Lab: 0.6 10*3/uL (ref 0–0.80)
Lab: 4.5 M/UL (ref 4.4–5.5)
Lab: 6.5 10*3/uL (ref 4.5–11.0)

## 2019-09-12 LAB — TSH WITH FREE T4 REFLEX: Lab: 1 uU/mL (ref >40)

## 2019-09-12 MED ORDER — METOPROLOL SUCCINATE 25 MG PO TB24
25 mg | ORAL_TABLET | Freq: Every evening | ORAL | 3 refills | 90.00000 days | Status: DC
Start: 2019-09-12 — End: 2019-11-28

## 2019-09-12 MED ORDER — FINASTERIDE 5 MG PO TAB
5 mg | ORAL_TABLET | Freq: Every day | ORAL | 3 refills | Status: AC
Start: 2019-09-12 — End: ?

## 2019-09-12 MED ORDER — OMEPRAZOLE 20 MG PO CPDR
20 mg | ORAL_CAPSULE | Freq: Every day | ORAL | 1 refills | Status: DC
Start: 2019-09-12 — End: 2020-06-07

## 2019-09-12 NOTE — Patient Instructions
-   It was nice to see you today!    - Let's change the metoprolol to 25 mg once daily at night to see if it helps with the lightheadedness you are noting during the day. Try to check your blood pressure when you are feeling that way. The Flomax could be contributing to those symptoms, as well.     - Your heart catheterization in July looked good and your blood pressure and cholesterol appear to be optimally controlled.     - I have requested a 6 month follow up visit with Dr. Trudee Kuster. Please send [place] a MyChart message or call our nurse line at [phone] with any questions or concerns in the meantime.
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
## 2019-09-12 NOTE — Progress Notes
[facility] Jonathan Humphrey is a 85 y.o.  male seen for an integrative behavioral health visit.    Visit Performed:Face to Face    Referring Physician: Reola Calkins Gen Medicine, Lucile Crater, MD    [facility] Provider name: Jinny Sanders, PhD    Focus of Visit: Mental Health  Anger     Additional Comments: At the provider's request, [facility] introduced self and the Integrated Behavioral Health service as a part of care available to Pt within the General Medicine Clinic. [facility] reviewed limits of confidentiality (including open communication with PCP, charting in EMR, trainee is part of service delivery) and instructions for how to schedule appt with [facility] service if interested. Pt agreed to proceed with treatment through [facility] services.     Pt identified bursts of anger as presenting problem.  Pt noted that his wife asked him to bring this to his PCP's attention today.  Pt reported that these occur rarely and last for brief (5-10 mins) periods.  Typically he becomes upset, then his wife gets upset, then he calms down when he sees her upset (we won't get anywhere at that point), but his wife continues to feel upset.  This problem began about 10 yrs ago, per Pt's recollection.  Pt reportedly worked at [employer] until 2013, at which point he retired due to worsening medical issues.      Pt had been reflecting on triggers for his anger prior to [facility] introduction; through course of appt, Pt identified wife's use of smart phone (Pt has a flip phone) and the updated technology in cars electronics as triggers. For example, the air conditioning was not 'synced' and this was frustrating to him that he could not figure out how to sync it when he was driving to his doctor's appt today.      Pt also reported hx of feeling claustrophobia and fear of being buried alive that sometimes occurs at nighttime upon waking from a dream.  To cope, he will get out of bed and become busy with another task until he shakes the feeling.  He has mentioned this to two providers previously including a cardiologist and has completed a sleep study and an MRI with no concerning results.  He notably had a valium to help his completion of the MRI, but felt it wore off prior to the MRI, so he mentally distracted himself to cope.  Today Pt has question of why does the feeling last? even after he's awakened and realized that it was just a dream.  Pt reported this problem is not particularly distressing or impairing, he's mostly curious to better understand his experience.    Specific Intervention: Coping Skills Training Brief CBT, Psychoeducation and Supportive Counseling   Provided supportive counseling to validate Pt's emotional response(s) and reinforce Pt's identification of triggers.  Provided and reviewed psychoeducational handouts for de-escalating stressful situations and managing anger.  Provided psychoeducation regarding brain structures that become activated during dreaming, hence the emotion that 'lasts' until he is able to relax or distract himself.     Educational Handouts Provided/Worksheets Assigned (sent via MyChart):  [facility] Anger Management  [facility] Handling Negative Feelings: De-escalation & Problem Solving  [facility] Tips for Managing Anger    Plan/Recommendation: No follow-up Integrative Vists Needed, Pt voiced understanding for how to ask PCP or calling clinic scheduling line if interested in returning to [facility] services in the future.    Patient goals: To improve self-management of my anger, I will notice the connection between triggering  situation, thoughts, and feelings, review the provided handouts (i.e., [facility] Anger Management, [facility] Handling Negative Feelings: De-escalation & Problem Solving and [facility] Tips for Managing Anger) and complete the assigned worksheet(s).    Length of visit: 25 minutes

## 2019-09-12 NOTE — Patient Instructions
Get fasting labs few days before return to clinic in 6 months

## 2019-09-13 ENCOUNTER — Encounter: Admit: 2019-09-13 | Discharge: 2019-09-13 | Payer: MEDICARE

## 2019-09-13 NOTE — Progress Notes
Called pt and LM with warfarin dosing instructions.

## 2019-09-18 ENCOUNTER — Encounter

## 2019-09-18 DIAGNOSIS — B351 Tinea unguium: Secondary | ICD-10-CM

## 2019-09-18 DIAGNOSIS — Z823 Family history of stroke: Secondary | ICD-10-CM

## 2019-09-18 DIAGNOSIS — R42 Dizziness and giddiness: Secondary | ICD-10-CM

## 2019-09-18 DIAGNOSIS — D489 Neoplasm of uncertain behavior, unspecified: Secondary | ICD-10-CM

## 2019-09-18 DIAGNOSIS — E039 Hypothyroidism, unspecified: Secondary | ICD-10-CM

## 2019-09-18 DIAGNOSIS — R51 Headache: Secondary | ICD-10-CM

## 2019-09-18 DIAGNOSIS — B078 Other viral warts: Secondary | ICD-10-CM

## 2019-09-18 DIAGNOSIS — I480 Paroxysmal atrial fibrillation: Secondary | ICD-10-CM

## 2019-09-18 DIAGNOSIS — L570 Actinic keratosis: Secondary | ICD-10-CM

## 2019-09-18 DIAGNOSIS — H9190 Unspecified hearing loss, unspecified ear: Secondary | ICD-10-CM

## 2019-09-18 DIAGNOSIS — F5221 Male erectile disorder: Secondary | ICD-10-CM

## 2019-09-18 DIAGNOSIS — M25569 Pain in unspecified knee: Secondary | ICD-10-CM

## 2019-09-18 DIAGNOSIS — I495 Sick sinus syndrome: Secondary | ICD-10-CM

## 2019-09-18 DIAGNOSIS — K219 Gastro-esophageal reflux disease without esophagitis: Secondary | ICD-10-CM

## 2019-09-18 DIAGNOSIS — M1990 Unspecified osteoarthritis, unspecified site: Secondary | ICD-10-CM

## 2019-09-18 DIAGNOSIS — J302 Other seasonal allergic rhinitis: Secondary | ICD-10-CM

## 2019-09-18 DIAGNOSIS — L821 Other seborrheic keratosis: Secondary | ICD-10-CM

## 2019-09-18 DIAGNOSIS — I10 Essential (primary) hypertension: Secondary | ICD-10-CM

## 2019-09-18 DIAGNOSIS — I4891 Unspecified atrial fibrillation: Secondary | ICD-10-CM

## 2019-09-18 DIAGNOSIS — B353 Tinea pedis: Secondary | ICD-10-CM

## 2019-09-18 DIAGNOSIS — E785 Hyperlipidemia, unspecified: Secondary | ICD-10-CM

## 2019-09-18 DIAGNOSIS — Z7901 Long term (current) use of anticoagulants: Secondary | ICD-10-CM

## 2019-09-18 DIAGNOSIS — R5383 Other fatigue: Secondary | ICD-10-CM

## 2019-09-18 DIAGNOSIS — L304 Erythema intertrigo: Secondary | ICD-10-CM

## 2019-09-18 DIAGNOSIS — L578 Other skin changes due to chronic exposure to nonionizing radiation: Secondary | ICD-10-CM

## 2019-09-18 DIAGNOSIS — R001 Bradycardia, unspecified: Secondary | ICD-10-CM

## 2019-09-21 ENCOUNTER — Encounter: Admit: 2019-09-21 | Discharge: 2019-09-21 | Payer: MEDICARE

## 2019-09-21 DIAGNOSIS — I480 Paroxysmal atrial fibrillation: Secondary | ICD-10-CM

## 2019-09-21 NOTE — Progress Notes
LMOM/MM

## 2019-09-26 ENCOUNTER — Encounter: Admit: 2019-09-26 | Discharge: 2019-09-26 | Payer: MEDICARE

## 2019-10-09 ENCOUNTER — Encounter: Admit: 2019-10-09 | Discharge: 2019-10-09 | Payer: MEDICARE

## 2019-10-09 DIAGNOSIS — I480 Paroxysmal atrial fibrillation: Secondary | ICD-10-CM

## 2019-10-09 NOTE — Progress Notes
INR within target range.

## 2019-10-23 ENCOUNTER — Encounter: Admit: 2019-10-23 | Discharge: 2019-10-23 | Payer: MEDICARE

## 2019-10-23 DIAGNOSIS — I480 Paroxysmal atrial fibrillation: Secondary | ICD-10-CM

## 2019-10-23 NOTE — Progress Notes
WNL/MM

## 2019-10-31 ENCOUNTER — Encounter: Admit: 2019-10-31 | Discharge: 2019-10-31 | Payer: MEDICARE

## 2019-10-31 DIAGNOSIS — I4891 Unspecified atrial fibrillation: Secondary | ICD-10-CM

## 2019-10-31 DIAGNOSIS — E039 Hypothyroidism, unspecified: Secondary | ICD-10-CM

## 2019-10-31 DIAGNOSIS — E782 Mixed hyperlipidemia: Secondary | ICD-10-CM

## 2019-10-31 DIAGNOSIS — I2510 Atherosclerotic heart disease of native coronary artery without angina pectoris: Secondary | ICD-10-CM

## 2019-10-31 MED ORDER — ATORVASTATIN 40 MG PO TAB
40 mg | ORAL_TABLET | Freq: Every evening | ORAL | 1 refills | Status: DC
Start: 2019-10-31 — End: 2020-04-22

## 2019-10-31 NOTE — Telephone Encounter
Refill Request received.    Patient last seen 09/12/2019 with plan to continue Lipitor 40.    No follow up appt scheduled at this time.    3 months and 1 refill e-scribed per protocol.    Hepatic Function    Lab Results   Component Value Date/Time    ALBUMIN 4.0 09/12/2019 08:14 AM    TOTPROT 6.4 09/12/2019 08:14 AM    ALKPHOS 44 09/12/2019 08:14 AM    Lab Results   Component Value Date/Time    AST 22 09/12/2019 08:14 AM    ALT 29 09/12/2019 08:14 AM    TOTBILI 1.0 09/12/2019 08:14 AM

## 2019-11-06 ENCOUNTER — Encounter: Admit: 2019-11-06 | Discharge: 2019-11-06 | Payer: MEDICARE

## 2019-11-06 DIAGNOSIS — I480 Paroxysmal atrial fibrillation: Secondary | ICD-10-CM

## 2019-11-21 ENCOUNTER — Encounter: Admit: 2019-11-21 | Discharge: 2019-11-21 | Payer: MEDICARE

## 2019-11-22 ENCOUNTER — Encounter: Admit: 2019-11-22 | Discharge: 2019-11-22 | Payer: MEDICARE

## 2019-11-22 DIAGNOSIS — I480 Paroxysmal atrial fibrillation: Secondary | ICD-10-CM

## 2019-11-27 ENCOUNTER — Encounter: Admit: 2019-11-27 | Discharge: 2019-11-27 | Payer: MEDICARE

## 2019-11-27 MED ORDER — TAMSULOSIN 0.4 MG PO CAP
ORAL_CAPSULE | Freq: Every day | ORAL | 1 refills | 90.00000 days | Status: AC
Start: 2019-11-27 — End: ?

## 2019-11-27 NOTE — Telephone Encounter
Refill Request received.    Patient last seen 09/12/2019 with plan to continue Flomax 0.8mg  daily.    No follow up appt scheduled at this time.    3 months and 1 refill e-scribed per protocol.

## 2019-11-28 ENCOUNTER — Encounter: Admit: 2019-11-28 | Discharge: 2019-11-28 | Payer: MEDICARE

## 2019-11-28 MED ORDER — METOPROLOL SUCCINATE 25 MG PO TB24
25 mg | ORAL_TABLET | Freq: Every evening | ORAL | 3 refills | 90.00000 days | Status: DC
Start: 2019-11-28 — End: 2020-04-03

## 2019-12-05 ENCOUNTER — Encounter: Admit: 2019-12-05 | Discharge: 2019-12-05 | Payer: MEDICARE

## 2019-12-05 NOTE — Progress Notes
I spoke to Miguel Barrera. He will take an extra dose tonight assuming he missed a dose. He will check it again on Friday.

## 2019-12-08 ENCOUNTER — Encounter: Admit: 2019-12-08 | Discharge: 2019-12-08 | Payer: MEDICARE

## 2019-12-08 DIAGNOSIS — I480 Paroxysmal atrial fibrillation: Secondary | ICD-10-CM

## 2019-12-08 NOTE — Progress Notes
Called pt and discussed INR results and testing on 12/24.  Pt verbalized understanding.

## 2019-12-25 ENCOUNTER — Encounter: Admit: 2019-12-25 | Discharge: 2019-12-25 | Payer: MEDICARE

## 2019-12-25 DIAGNOSIS — I480 Paroxysmal atrial fibrillation: Secondary | ICD-10-CM

## 2020-01-09 ENCOUNTER — Encounter: Admit: 2020-01-09 | Discharge: 2020-01-09 | Payer: MEDICARE

## 2020-01-09 DIAGNOSIS — I480 Paroxysmal atrial fibrillation: Secondary | ICD-10-CM

## 2020-01-09 NOTE — Progress Notes
WNL/MM

## 2020-01-12 ENCOUNTER — Encounter: Admit: 2020-01-12 | Discharge: 2020-01-12 | Payer: MEDICARE

## 2020-01-12 DIAGNOSIS — I4891 Unspecified atrial fibrillation: Secondary | ICD-10-CM

## 2020-01-12 DIAGNOSIS — I2510 Atherosclerotic heart disease of native coronary artery without angina pectoris: Secondary | ICD-10-CM

## 2020-01-12 DIAGNOSIS — E039 Hypothyroidism, unspecified: Secondary | ICD-10-CM

## 2020-01-12 DIAGNOSIS — E782 Mixed hyperlipidemia: Secondary | ICD-10-CM

## 2020-01-12 MED ORDER — EZETIMIBE 10 MG PO TAB
ORAL_TABLET | Freq: Every day | 1 refills | Status: DC
Start: 2020-01-12 — End: 2020-07-29

## 2020-01-12 NOTE — Telephone Encounter
Refill Request received.    Patient last seen 09/12/2019.    No follow up appt scheduled at this time.    3 months and 1 refill e-scribed per protocol.    Hepatic Function    Lab Results   Component Value Date/Time    ALBUMIN 4.0 09/12/2019 08:14 AM    TOTPROT 6.4 09/12/2019 08:14 AM    ALKPHOS 44 09/12/2019 08:14 AM    Lab Results   Component Value Date/Time    AST 22 09/12/2019 08:14 AM    ALT 29 09/12/2019 08:14 AM    TOTBILI 1.0 09/12/2019 08:14 AM

## 2020-01-24 ENCOUNTER — Encounter: Admit: 2020-01-24 | Discharge: 2020-01-24 | Payer: MEDICARE

## 2020-01-24 DIAGNOSIS — I480 Paroxysmal atrial fibrillation: Secondary | ICD-10-CM

## 2020-01-29 ENCOUNTER — Encounter: Admit: 2020-01-29 | Discharge: 2020-01-29 | Payer: MEDICARE

## 2020-01-29 DIAGNOSIS — E782 Mixed hyperlipidemia: Secondary | ICD-10-CM

## 2020-01-29 DIAGNOSIS — I2510 Atherosclerotic heart disease of native coronary artery without angina pectoris: Secondary | ICD-10-CM

## 2020-01-29 DIAGNOSIS — I10 Essential (primary) hypertension: Secondary | ICD-10-CM

## 2020-01-29 DIAGNOSIS — I472 Ventricular tachycardia: Secondary | ICD-10-CM

## 2020-01-29 NOTE — Telephone Encounter
-----   Message from Elyse Hsu sent at 01/29/2020 11:48 AM CST -----  Regarding: Pt of Dr Trudee Kuster  Alert for VT episodes - device is PPM     Stored EGMs are consistent with or suggestive of Non-sustained  VT  Total episodes: 5  Most recent on 01/27/2020 durations ~5-10 sec. V rates 166-183  Bpm    Please follow-up as needed

## 2020-01-29 NOTE — Telephone Encounter
I spoke to patient and confirmed that he will have labs drawn locally. I faxed to [facility]  F: [phone]. Patient confirmed 2/18 0800 echo and Ov at 0900 with J.Tibke, NP at [facility] [place] location

## 2020-01-29 NOTE — Telephone Encounter
From: Henrene Hawking, MD  Sent: 01/29/2020   3:05 PM CST  To: Carin Primrose, RN    Laurabelle Gorczyca-please have him check CBC/CMP/BNP/magnesium and repeat cardiac echo.  He will need follow-up office visit with Sharee Pimple.  Thanks RG  ----- Message -----  From: Carin Primrose, RN  Sent: 01/29/2020   1:45 PM CST  To: Henrene Hawking, MD, *    ----- Message from Carin Primrose, RN sent at 01/29/2020  1:45 PM CST -----  Dr Trudee Kuster - patient on Toprol XL 25mg /d. He has PPM only and 2 episodes of NSVT for 5-10 seconds each on Saturday 2/6. Pt denies any symptoms. Please review and advise. Thanks, Journalist, newspaper

## 2020-01-29 NOTE — Telephone Encounter
I spoke to patient to discuss if he had any symptoms with 5-10 second episodes at below date/times on 2/6. Patient denies symptoms and confirms he is taking his Toprol XL at bedtime. Patient states he had his first COVID vaccine on 01/22/20 - he denied symptoms with vaccine except mild arm tenderness to injection site. Patient states he has not had recent labs. I let him know I would call back with next steps and to discuss follow up appointment plan. Patient has PPM only.     Events:   Jan 27, 2020 23:33 VT (V>A) at 188 bpm  (patient away, asymptomatic)    Jan 27, 2020 19:05 VT (V>A) at 181 bpm (patient thinks he was asleep)

## 2020-02-01 ENCOUNTER — Ambulatory Visit: Admit: 2020-02-01 | Discharge: 2020-02-01 | Payer: MEDICARE

## 2020-02-01 DIAGNOSIS — Z950 Presence of cardiac pacemaker: Secondary | ICD-10-CM

## 2020-02-01 DIAGNOSIS — I441 Atrioventricular block, second degree: Secondary | ICD-10-CM

## 2020-02-06 ENCOUNTER — Encounter: Admit: 2020-02-06 | Discharge: 2020-02-06 | Payer: MEDICARE

## 2020-02-06 DIAGNOSIS — I480 Paroxysmal atrial fibrillation: Secondary | ICD-10-CM

## 2020-02-06 NOTE — Telephone Encounter
LM with Med records at [facility] asking for lab results to be faxed. Pt reported that they were drawn last week when I spoke to him about his INR result. I spoke to lab also and they will get lab results faxed to [place].

## 2020-02-06 NOTE — Progress Notes
Pt/MM

## 2020-02-08 ENCOUNTER — Ambulatory Visit: Admit: 2020-02-08 | Discharge: 2020-02-08 | Payer: MEDICARE

## 2020-02-08 ENCOUNTER — Encounter: Admit: 2020-02-08 | Discharge: 2020-02-08 | Payer: MEDICARE

## 2020-02-08 DIAGNOSIS — Z7901 Long term (current) use of anticoagulants: Secondary | ICD-10-CM

## 2020-02-08 DIAGNOSIS — L821 Other seborrheic keratosis: Secondary | ICD-10-CM

## 2020-02-08 DIAGNOSIS — R519 Generalized headaches: Secondary | ICD-10-CM

## 2020-02-08 DIAGNOSIS — E039 Hypothyroidism, unspecified: Secondary | ICD-10-CM

## 2020-02-08 DIAGNOSIS — I2510 Atherosclerotic heart disease of native coronary artery without angina pectoris: Secondary | ICD-10-CM

## 2020-02-08 DIAGNOSIS — L570 Actinic keratosis: Secondary | ICD-10-CM

## 2020-02-08 DIAGNOSIS — M25569 Pain in unspecified knee: Secondary | ICD-10-CM

## 2020-02-08 DIAGNOSIS — H9190 Unspecified hearing loss, unspecified ear: Secondary | ICD-10-CM

## 2020-02-08 DIAGNOSIS — B079 Viral wart, unspecified: Secondary | ICD-10-CM

## 2020-02-08 DIAGNOSIS — I472 Ventricular tachycardia: Secondary | ICD-10-CM

## 2020-02-08 DIAGNOSIS — R001 Bradycardia, unspecified: Secondary | ICD-10-CM

## 2020-02-08 DIAGNOSIS — I480 Paroxysmal atrial fibrillation: Secondary | ICD-10-CM

## 2020-02-08 DIAGNOSIS — D489 Neoplasm of uncertain behavior, unspecified: Secondary | ICD-10-CM

## 2020-02-08 DIAGNOSIS — K219 Gastro-esophageal reflux disease without esophagitis: Secondary | ICD-10-CM

## 2020-02-08 DIAGNOSIS — Z823 Family history of stroke: Secondary | ICD-10-CM

## 2020-02-08 DIAGNOSIS — B353 Tinea pedis: Secondary | ICD-10-CM

## 2020-02-08 DIAGNOSIS — J302 Other seasonal allergic rhinitis: Secondary | ICD-10-CM

## 2020-02-08 DIAGNOSIS — I10 Essential (primary) hypertension: Secondary | ICD-10-CM

## 2020-02-08 DIAGNOSIS — I495 Sick sinus syndrome: Secondary | ICD-10-CM

## 2020-02-08 DIAGNOSIS — B351 Tinea unguium: Secondary | ICD-10-CM

## 2020-02-08 DIAGNOSIS — R5383 Other fatigue: Secondary | ICD-10-CM

## 2020-02-08 DIAGNOSIS — E782 Mixed hyperlipidemia: Secondary | ICD-10-CM

## 2020-02-08 DIAGNOSIS — R42 Dizziness and giddiness: Secondary | ICD-10-CM

## 2020-02-08 DIAGNOSIS — E785 Hyperlipidemia, unspecified: Secondary | ICD-10-CM

## 2020-02-08 DIAGNOSIS — I4891 Unspecified atrial fibrillation: Secondary | ICD-10-CM

## 2020-02-08 DIAGNOSIS — L578 Other skin changes due to chronic exposure to nonionizing radiation: Secondary | ICD-10-CM

## 2020-02-08 DIAGNOSIS — F5221 Male erectile disorder: Secondary | ICD-10-CM

## 2020-02-08 DIAGNOSIS — L304 Erythema intertrigo: Secondary | ICD-10-CM

## 2020-02-08 DIAGNOSIS — M1990 Unspecified osteoarthritis, unspecified site: Secondary | ICD-10-CM

## 2020-02-08 MED ORDER — PERFLUTREN LIPID MICROSPHERES 1.1 MG/ML IV SUSP
1-20 mL | Freq: Once | INTRAVENOUS | 0 refills | Status: CP | PRN
Start: 2020-02-08 — End: ?
  Administered 2020-02-08: 16:00:00 4 mL via INTRAVENOUS

## 2020-02-12 ENCOUNTER — Encounter: Admit: 2020-02-12 | Discharge: 2020-02-12 | Payer: MEDICARE

## 2020-02-14 ENCOUNTER — Encounter: Admit: 2020-02-14 | Discharge: 2020-02-14 | Payer: MEDICARE

## 2020-02-14 DIAGNOSIS — I480 Paroxysmal atrial fibrillation: Secondary | ICD-10-CM

## 2020-02-20 ENCOUNTER — Encounter: Admit: 2020-02-20 | Discharge: 2020-02-20 | Payer: MEDICARE

## 2020-02-20 DIAGNOSIS — I480 Paroxysmal atrial fibrillation: Secondary | ICD-10-CM

## 2020-02-20 NOTE — Progress Notes
INR within goal range. No call indicated.

## 2020-02-26 ENCOUNTER — Encounter: Admit: 2020-02-26 | Discharge: 2020-02-26 | Payer: MEDICARE

## 2020-02-26 DIAGNOSIS — I2510 Atherosclerotic heart disease of native coronary artery without angina pectoris: Secondary | ICD-10-CM

## 2020-02-26 DIAGNOSIS — E782 Mixed hyperlipidemia: Secondary | ICD-10-CM

## 2020-02-26 DIAGNOSIS — E039 Hypothyroidism, unspecified: Secondary | ICD-10-CM

## 2020-02-26 DIAGNOSIS — I4891 Unspecified atrial fibrillation: Secondary | ICD-10-CM

## 2020-02-26 MED ORDER — LEVOTHYROXINE 50 MCG PO TAB
ORAL_TABLET | Freq: Every day | ORAL | 1 refills | 30.00000 days | Status: AC
Start: 2020-02-26 — End: ?

## 2020-02-26 NOTE — Telephone Encounter
TSH   Lab Results   Component Value Date/Time    TSH 1.24 02/02/2020        This medication is standing and can be approved per protocol.

## 2020-03-06 ENCOUNTER — Encounter: Admit: 2020-03-06 | Discharge: 2020-03-06 | Payer: MEDICARE

## 2020-03-06 DIAGNOSIS — I480 Paroxysmal atrial fibrillation: Secondary | ICD-10-CM

## 2020-03-13 ENCOUNTER — Encounter: Admit: 2020-03-13 | Discharge: 2020-03-13 | Payer: MEDICARE

## 2020-03-13 MED ORDER — LISINOPRIL 20 MG PO TAB
ORAL_TABLET | Freq: Every day | 3 refills | Status: AC
Start: 2020-03-13 — End: ?

## 2020-03-25 ENCOUNTER — Encounter: Admit: 2020-03-25 | Discharge: 2020-03-25 | Payer: MEDICARE

## 2020-03-25 DIAGNOSIS — I480 Paroxysmal atrial fibrillation: Secondary | ICD-10-CM

## 2020-04-03 ENCOUNTER — Ambulatory Visit: Admit: 2020-04-03 | Discharge: 2020-04-04 | Payer: MEDICARE

## 2020-04-03 ENCOUNTER — Encounter: Admit: 2020-04-03 | Discharge: 2020-04-03 | Payer: MEDICARE

## 2020-04-03 DIAGNOSIS — R519 Generalized headaches: Secondary | ICD-10-CM

## 2020-04-03 DIAGNOSIS — Z7901 Long term (current) use of anticoagulants: Secondary | ICD-10-CM

## 2020-04-03 DIAGNOSIS — L570 Actinic keratosis: Secondary | ICD-10-CM

## 2020-04-03 DIAGNOSIS — R5383 Other fatigue: Secondary | ICD-10-CM

## 2020-04-03 DIAGNOSIS — R42 Dizziness and giddiness: Secondary | ICD-10-CM

## 2020-04-03 DIAGNOSIS — I4891 Unspecified atrial fibrillation: Secondary | ICD-10-CM

## 2020-04-03 DIAGNOSIS — I495 Sick sinus syndrome: Secondary | ICD-10-CM

## 2020-04-03 DIAGNOSIS — K219 Gastro-esophageal reflux disease without esophagitis: Secondary | ICD-10-CM

## 2020-04-03 DIAGNOSIS — B351 Tinea unguium: Secondary | ICD-10-CM

## 2020-04-03 DIAGNOSIS — L578 Other skin changes due to chronic exposure to nonionizing radiation: Secondary | ICD-10-CM

## 2020-04-03 DIAGNOSIS — D489 Neoplasm of uncertain behavior, unspecified: Secondary | ICD-10-CM

## 2020-04-03 DIAGNOSIS — L821 Other seborrheic keratosis: Secondary | ICD-10-CM

## 2020-04-03 DIAGNOSIS — I480 Paroxysmal atrial fibrillation: Secondary | ICD-10-CM

## 2020-04-03 DIAGNOSIS — I472 Ventricular tachycardia: Secondary | ICD-10-CM

## 2020-04-03 DIAGNOSIS — L304 Erythema intertrigo: Secondary | ICD-10-CM

## 2020-04-03 DIAGNOSIS — M25569 Pain in unspecified knee: Secondary | ICD-10-CM

## 2020-04-03 DIAGNOSIS — B353 Tinea pedis: Secondary | ICD-10-CM

## 2020-04-03 DIAGNOSIS — F5221 Male erectile disorder: Secondary | ICD-10-CM

## 2020-04-03 DIAGNOSIS — E039 Hypothyroidism, unspecified: Secondary | ICD-10-CM

## 2020-04-03 DIAGNOSIS — E785 Hyperlipidemia, unspecified: Secondary | ICD-10-CM

## 2020-04-03 DIAGNOSIS — J302 Other seasonal allergic rhinitis: Secondary | ICD-10-CM

## 2020-04-03 DIAGNOSIS — Z950 Presence of cardiac pacemaker: Secondary | ICD-10-CM

## 2020-04-03 DIAGNOSIS — B079 Viral wart, unspecified: Secondary | ICD-10-CM

## 2020-04-03 DIAGNOSIS — R001 Bradycardia, unspecified: Secondary | ICD-10-CM

## 2020-04-03 DIAGNOSIS — H9190 Unspecified hearing loss, unspecified ear: Secondary | ICD-10-CM

## 2020-04-03 DIAGNOSIS — M1990 Unspecified osteoarthritis, unspecified site: Secondary | ICD-10-CM

## 2020-04-03 DIAGNOSIS — I10 Essential (primary) hypertension: Secondary | ICD-10-CM

## 2020-04-03 DIAGNOSIS — Z823 Family history of stroke: Secondary | ICD-10-CM

## 2020-04-03 MED ORDER — METOPROLOL SUCCINATE 50 MG PO TB24
50 mg | ORAL_TABLET | Freq: Every evening | ORAL | 3 refills | 90.00000 days | Status: AC
Start: 2020-04-03 — End: ?

## 2020-04-04 ENCOUNTER — Encounter: Admit: 2020-04-04 | Discharge: 2020-04-04 | Payer: MEDICARE

## 2020-04-04 DIAGNOSIS — I480 Paroxysmal atrial fibrillation: Secondary | ICD-10-CM

## 2020-04-22 ENCOUNTER — Encounter: Admit: 2020-04-22 | Discharge: 2020-04-22 | Payer: MEDICARE

## 2020-04-22 DIAGNOSIS — E782 Mixed hyperlipidemia: Secondary | ICD-10-CM

## 2020-04-22 DIAGNOSIS — I4891 Unspecified atrial fibrillation: Secondary | ICD-10-CM

## 2020-04-22 DIAGNOSIS — I2510 Atherosclerotic heart disease of native coronary artery without angina pectoris: Secondary | ICD-10-CM

## 2020-04-22 DIAGNOSIS — I358 Other nonrheumatic aortic valve disorders: Secondary | ICD-10-CM

## 2020-04-22 DIAGNOSIS — E039 Hypothyroidism, unspecified: Secondary | ICD-10-CM

## 2020-04-22 MED ORDER — ATORVASTATIN 40 MG PO TAB
40 mg | ORAL_TABLET | Freq: Every evening | ORAL | 1 refills | Status: AC
Start: 2020-04-22 — End: ?

## 2020-04-22 MED ORDER — WARFARIN 4 MG PO TAB
ORAL_TABLET | Freq: Every day | ORAL | 3 refills | 90.00000 days | Status: AC
Start: 2020-04-22 — End: ?

## 2020-04-22 NOTE — Telephone Encounter
Refill Request received.    Patient last seen 09/12/2019 with plan to continue  Lipitor 40.    No follow up appt scheduled at this time.    3 months and 1 refill e-scribed per protocol.    Hepatic Function    Lab Results   Component Value Date/Time    ALBUMIN 3.9 02/02/2020    TOTPROT 7.1 02/02/2020    ALKPHOS 44 02/02/2020    Lab Results   Component Value Date/Time    AST 26 02/02/2020    ALT 52 02/02/2020    TOTBILI 0.7 02/02/2020

## 2020-04-24 ENCOUNTER — Encounter: Admit: 2020-04-24 | Discharge: 2020-04-24 | Payer: MEDICARE

## 2020-04-24 DIAGNOSIS — I480 Paroxysmal atrial fibrillation: Secondary | ICD-10-CM

## 2020-04-24 NOTE — Progress Notes
WNL/MM

## 2020-05-01 ENCOUNTER — Encounter: Admit: 2020-05-01 | Discharge: 2020-05-01 | Payer: MEDICARE

## 2020-05-01 DIAGNOSIS — Z7901 Long term (current) use of anticoagulants: Secondary | ICD-10-CM

## 2020-05-01 NOTE — Telephone Encounter
Pt LVM requesting call back to see if he needs labs for appt in July.    Spoke to pt to inform that he did.

## 2020-05-01 NOTE — Telephone Encounter
Patient called to see if he needed labs drawn prior to REG OV. I let patient know we will see him first and decide at that time if we need any labs repeated again.

## 2020-05-02 ENCOUNTER — Encounter: Admit: 2020-05-02 | Discharge: 2020-05-02 | Payer: MEDICARE

## 2020-05-07 ENCOUNTER — Ambulatory Visit: Admit: 2020-05-07 | Discharge: 2020-05-07 | Payer: MEDICARE

## 2020-05-07 ENCOUNTER — Encounter: Admit: 2020-05-07 | Discharge: 2020-05-07 | Payer: MEDICARE

## 2020-05-07 DIAGNOSIS — J302 Other seasonal allergic rhinitis: Secondary | ICD-10-CM

## 2020-05-07 DIAGNOSIS — I10 Essential (primary) hypertension: Secondary | ICD-10-CM

## 2020-05-07 DIAGNOSIS — I472 Ventricular tachycardia: Secondary | ICD-10-CM

## 2020-05-07 DIAGNOSIS — Z950 Presence of cardiac pacemaker: Secondary | ICD-10-CM

## 2020-05-07 DIAGNOSIS — M1990 Unspecified osteoarthritis, unspecified site: Secondary | ICD-10-CM

## 2020-05-07 DIAGNOSIS — F5221 Male erectile disorder: Secondary | ICD-10-CM

## 2020-05-07 DIAGNOSIS — B351 Tinea unguium: Secondary | ICD-10-CM

## 2020-05-07 DIAGNOSIS — I4891 Unspecified atrial fibrillation: Secondary | ICD-10-CM

## 2020-05-07 DIAGNOSIS — R519 Generalized headaches: Secondary | ICD-10-CM

## 2020-05-07 DIAGNOSIS — L570 Actinic keratosis: Secondary | ICD-10-CM

## 2020-05-07 DIAGNOSIS — I2510 Atherosclerotic heart disease of native coronary artery without angina pectoris: Secondary | ICD-10-CM

## 2020-05-07 DIAGNOSIS — K219 Gastro-esophageal reflux disease without esophagitis: Secondary | ICD-10-CM

## 2020-05-07 DIAGNOSIS — Z7901 Long term (current) use of anticoagulants: Secondary | ICD-10-CM

## 2020-05-07 DIAGNOSIS — E785 Hyperlipidemia, unspecified: Secondary | ICD-10-CM

## 2020-05-07 DIAGNOSIS — D7589 Other specified diseases of blood and blood-forming organs: Secondary | ICD-10-CM

## 2020-05-07 DIAGNOSIS — E782 Mixed hyperlipidemia: Secondary | ICD-10-CM

## 2020-05-07 DIAGNOSIS — I480 Paroxysmal atrial fibrillation: Secondary | ICD-10-CM

## 2020-05-07 DIAGNOSIS — D489 Neoplasm of uncertain behavior, unspecified: Secondary | ICD-10-CM

## 2020-05-07 DIAGNOSIS — Z823 Family history of stroke: Secondary | ICD-10-CM

## 2020-05-07 DIAGNOSIS — I495 Sick sinus syndrome: Secondary | ICD-10-CM

## 2020-05-07 DIAGNOSIS — R42 Dizziness and giddiness: Secondary | ICD-10-CM

## 2020-05-07 DIAGNOSIS — B079 Viral wart, unspecified: Secondary | ICD-10-CM

## 2020-05-07 DIAGNOSIS — M25569 Pain in unspecified knee: Secondary | ICD-10-CM

## 2020-05-07 DIAGNOSIS — L304 Erythema intertrigo: Secondary | ICD-10-CM

## 2020-05-07 DIAGNOSIS — E039 Hypothyroidism, unspecified: Secondary | ICD-10-CM

## 2020-05-07 DIAGNOSIS — B353 Tinea pedis: Secondary | ICD-10-CM

## 2020-05-07 DIAGNOSIS — R5383 Other fatigue: Secondary | ICD-10-CM

## 2020-05-07 DIAGNOSIS — R001 Bradycardia, unspecified: Secondary | ICD-10-CM

## 2020-05-07 DIAGNOSIS — H9190 Unspecified hearing loss, unspecified ear: Secondary | ICD-10-CM

## 2020-05-07 DIAGNOSIS — L578 Other skin changes due to chronic exposure to nonionizing radiation: Secondary | ICD-10-CM

## 2020-05-07 DIAGNOSIS — L821 Other seborrheic keratosis: Secondary | ICD-10-CM

## 2020-05-07 LAB — COMPREHENSIVE METABOLIC PANEL
Lab: 1.2 mg/dL (ref 0.3–1.2)
Lab: 137 MMOL/L (ref 137–147)
Lab: 26 MMOL/L (ref 21–30)
Lab: 27 U/L (ref 7–40)
Lab: 34 U/L (ref 7–56)
Lab: 4.3 g/dL — ABNORMAL LOW (ref 3.5–5.0)
Lab: 4.4 MMOL/L (ref 3.5–5.1)
Lab: 42 U/L (ref 25–110)
Lab: 8 K/UL (ref 3–12)
Lab: >60 mL/min (ref >60)
Lab: >60 mL/min (ref >60)

## 2020-05-07 LAB — CBC AND DIFF
Lab: 0 10*3/uL (ref 0–0.20)
Lab: 4.7 M/UL (ref 4.4–5.5)
Lab: 6.6 K/UL (ref 4.5–11.0)

## 2020-05-07 LAB — VITAMIN B12: Lab: 276 pg/mL (ref 180–914)

## 2020-05-07 LAB — LIPID PROFILE
Lab: 146 mg/dL — ABNORMAL HIGH (ref <200)
Lab: 20 mg/dL (ref 8.5–10.6)
Lab: 75 mg/dL — AB (ref <100)
Lab: 91 mg/dL (ref 6.0–8.0)
Lab: 99 mg/dL — ABNORMAL HIGH (ref <150)

## 2020-05-07 LAB — FOLATE, SERUM: Lab: 17 ng/mL (ref >3.9)

## 2020-05-07 LAB — MAGNESIUM: Lab: 2 mg/dL (ref >40)

## 2020-05-07 NOTE — Progress Notes
Date of Service: 05/07/2020    Jonathan Humphrey is a 86 y.o. male.       HPI    Jonathan Humphrey comes for followup.  I saw him last year.  Jonathan Humphrey is now 86 years old.  Jonathan Humphrey is a very delightful gentleman with a history of paroxysmal atrial fibrillation and previous AFib ablation.  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  Jonathan Humphrey had resection of his left atrial appendage.  Jonathan Humphrey had angioplasty back in 1995.  Jonathan Humphrey has a permanent pacemaker for chronotropic incompetence.  Jonathan Humphrey has had hypertension, hyperlipidemia, hypothyroidism, and gastroesophageal reflux.  Jonathan Humphrey had a heart catheterization about 3 years ago that showed only mild nonobstructive disease.  Jonathan Humphrey has had a previous MRI that did not show any delayed hyperenhancement.     Last year, Jonathan Humphrey was hospitalized with VT.  Jonathan Humphrey had stopped his metoprolol.  Jonathan Humphrey ended up with repeat heart catheterization, which did not show any significant obstructive disease.  His cardiac imaging showed normal ejection fraction with no significant change.  Jonathan Humphrey does have some basal septal hypertrophy.  Jonathan Humphrey does have some mitral annular calcification and mild mitral stenosis and mild insufficiency.  His pulmonary pressures have been normal.     Jonathan Humphrey saw Dr. [name].  His pacemaker continues to show some intermittent nonsustained VT.  Jonathan Humphrey has been asymptomatic.  She increased his beta-blocker from 25 to 50 mg per day.  Jonathan Humphrey gets occasional inner ear problems and balance issues.  Jonathan Humphrey has not had any falls or syncope.  Jonathan Humphrey denies TIA, stroke, claudication, fever, chills, sweats, and edema.  Jonathan Humphrey did receive his COVID vaccination without problems.    ([id number])               Vitals:    05/07/20 1044   BP: 124/84   BP Source: Arm, Left Upper   Patient Position: Sitting   Pulse: 79   SpO2: 96%   Weight: 87.5 kg (192 lb 12.8 oz)   Height: 1.721 m (5' 7.75)   PainSc: Zero     Body mass index is 29.53 kg/m?Marland Kitchen     Past Medical History  Patient Active Problem List    Diagnosis Date Noted ? Rash 11/14/2018   ? Head trauma, initial encounter 10/20/2018   ? Carpal tunnel syndrome of left wrist 07/05/2018   ? PTSD (post-traumatic stress disorder) 07/05/2018   ? Benign prostatic hyperplasia with lower urinary tract symptoms 06/03/2017   ? Shoulder arthritis 04/05/2017   ? Trigger middle finger of right hand 12/09/2016   ? Paroxysmal ventricular tachycardia (HCC) 08/13/2016     06/07/2019 - ECHO:  Normal left ventricular size and systolic function.  LVEF 65%.  There is paradoxical septal wall motion abnormality.  Normal right ventricular size and systolic function.  Pacer lead was visualized in the right ventricle.  No hemodynamically significant valve disease.  There is moderate mitral annular calcification.  No pericardial effusion.  Mildly dilated sinus of Valsalva.  02/11/2020 - ECHO:  Normal left ventricular size. Prominent basal septum. No outflow tract obstruction. Normal systolic function. Estimated EF ~65%.  Abnormal septal wall motion consistent with right ventricular pacing.  Unable to assess diastolic function.  Normal right ventricular size and systolic function.  The left atrium is mildly dilated.  Normal right atrial size.  Device leads in right-sided chambers.  Moderate calcification of the mitral valve annulus with extension into the leaflets.  Very mild stenosis.  Mean  gradient 3-4 mmHg at a heart rate of 78 bpm.  Mild regurgitation.  Aortic valve sclerosis without stenosis.  Trivial regurgitation.  Mild to moderate tricuspid valve regurgitation.  No pericardial effusion.  Estimated pulmonary artery systolic pressure 26 mmHg.   The aortic root is dilated, measuring 3.8 cm at the sinus of Valsalva.     ? Right inguinal hernia 12/06/2015   ? Cardiac pacemaker in situ 10/18/2015     ? 10/21/15 Med[employer]onic dual-chamber PPM implantation - Dr. Naoma Diener     ? 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 BMI 31.0-31.9,adult 08/10/2015   ? Primary osteoarthritis of left knee 06/04/2015   ? AV block, 2nd degree 07/14/2010   ? Leg cramps, sleep related 04/10/2010   ? Aortic valve mass 03/28/2010     05/14/2010: Pt. underwent resection of intracardiac mass from right coronary cusp of aortic valve and modified radiofrequency maze with bilateral pulmonary vein isolation and resection of left atrial appendage by Dr. Helen Hashimoto.     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  Aortic valve function was preserved.  ?     ? 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 Micheline Rough: 10-20% prox-CFX, 20-30% prox-RCA  06/08/2019 - CCTA:  Extensive coronary artery calcifications making the accurate estimation of stenosis difficult.  Normal coronary artery system origins and course. Right dominant system.  The left anterior descending artery with extensive coronary artery calcification in the proximal to mid segments severe stenosis cannot be excluded. The left anterior descending artery is patent in the mid to distal segments and not well-visualized apically. Study will be assessed by FFR to evaluate hemodynamically.  The left circumflex artery has mild to moderate calcific atherosclerotic changes without obvious stenosis.  The right coronary artery has mild to moderate calcific atherosclerotic changes with mild stenosis proximally. The PDA is not visualized distally.      ? HLD (hyperlipidemia) 04/11/2009   ? Essential hypertension 04/11/2009   ? GERD (gastroesophageal reflux disease) 04/11/2009   ? Sensorineural hearing loss 04/11/2009   ? Erectile dysfunction of non-organic origin 04/11/2009   ? Hypothyroid 04/11/2009   ? PAF (paroxysmal atrial fibrillation) (HCC) 04/11/2009   ? Chronic anticoagulation, on warfarin 04/11/2009         Review of Systems   All other systems reviewed and are negative.  Review of systems as documented in the database.    ([id number])        Physical Exam  On examination, Jonathan Humphrey is in no distress.  Jonathan Humphrey is 5 feet 8.  Weight is 192.  Weight is down about 18 pounds since last year.  Blood pressure 124/84.  BMI is 29.5.  His rhythm is paced at 80 beats per minute.  Saturation is 96%.  There is no cyanosis.  Venous pressure is normal.  There is no edema.  Lungs are clear.  There is no wheeze or rhonchi.  PMI is not felt.  Heart sounds reveal a 1/6 systolic murmur along the left sternal border.  There are no diastolic murmurs.  There is no hepatomegaly.  There are no abdominal bruits.  Bowel sounds are normal.  There is no icterus.  There is no focal neurologic deficit.  Distal pulses are 2+ bilaterally.  There are no carotid bruits.  Jonathan Humphrey is alert and oriented times 3.  His mood, judgment, and affect are normal.  Jonathan Humphrey does  have a skin lesion on the top of his right ear that may be a squamous cell cancer.  Jonathan Humphrey is going to see his dermatologist.    [phone])        Cardiovascular Studies  EKG shows atrial sensed, ventricular paced rhythm.    ([id number])        Problems Addressed Today  Encounter Diagnoses   Name Primary?   ? Coronary artery disease, non-occlusive Yes   ? Essential hypertension    ? PAF (paroxysmal atrial fibrillation) (HCC)    ? Mixed hyperlipidemia    ? Cardiac pacemaker in situ    ? Chronic anticoagulation, on warfarin    ? 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 Paroxysmal ventricular tachycardia (HCC)        Assessment and Plan    Jonathan Humphrey is stable from a cardiac perspective.  Jonathan Humphrey is not having angina, heart failure, or symptomatic arrhythmias.  Jonathan Humphrey does have some intermittent asymptomatic nonsustained VT.  His beta blocker has been increased.  Jonathan Humphrey will continue to follow with Dr. [name] as directed.  I would like to check lab work.  Jonathan Humphrey has been on warfarin anticoagulation.  His INR yesterday was 3.  We will continue to follow him closely.  If things go well, we will see him in 6 months' time.  If I can be of assistance sooner, please do not hesitate to let me know.    ([id number])               Current Medications (including today's revisions)  ? aspirin EC 81 mg tablet Take 1 tablet by mouth daily. Take with food.   ? atorvastatin (LIPITOR) 40 mg tablet Take one tablet by mouth at bedtime daily.   ? cholecalciferol (VITAMIN D-3) 1,000 units tablet Take 1,500 Units by mouth daily.   ? ezetimibe (ZETIA) 10 mg tablet TAKE (1) TABLET BY MOUTH ONCE DAILY   ? finasteride (PROSCAR) 5 mg tablet Take one tablet by mouth daily.   ? ketoconazole (NIZORAL) 2 % topical cream Apply  to affected area twice daily. to scaly areas on forehead and nose   ? levothyroxine (SYNTHROID) 50 mcg tablet TAKE (1) TABLET BY MOUTH ONCE DAILY   ? lisinopriL (ZESTRIL) 20 mg tablet TAKE (1) TABLET BY MOUTH ONCE DAILY   ? metoprolol XL (TOPROL XL) 50 mg extended release tablet Take one tablet by mouth at bedtime daily.   ? OMEGA-3 FATTY ACIDS/FISH OIL (FISH OIL-OMEGA-3 FATTY ACIDS PO) Take  by mouth daily. 2 tsp daily     ? omeprazole DR (PRILOSEC) 20 mg capsule Take one capsule by mouth daily.   ? tamsulosin (FLOMAX) 0.4 mg capsule TAKE 2 CAPSULES 30 MINUTES FOLLOWING THE SAME MEAL DAILY. DO NOT CRUSH, CHEW OR OPEN CAPSULES.   ? warfarin (COUMADIN) 4 mg tablet TAKE 1 TABLET DAILY OR AS INSTRUCTED BY DR. Vivianne Spence

## 2020-05-07 NOTE — Progress Notes
Pt/MM

## 2020-05-07 NOTE — Patient Instructions
We will draw labs on you today. We will not draw an INR.     Continue Warfarin dose and recheck INR in 2 weeks.     We would like to see you again for a follow up visit in 6 months    Please send a MyChart message or call the Cardiology [facility] Team with questions, [phone], Raina Mina, Cicero Duck RN, Herbie Baltimore, RN, [facility] LPN.     You may receive test results in MyChart before the ordering provider has reviewed them. Our care team will follow up with you after reviewing the tests to discuss your care. This may take up to 5-7 business days if results are not urgently needing to be addressed. Thank you for your patience.

## 2020-05-21 ENCOUNTER — Encounter: Admit: 2020-05-21 | Discharge: 2020-05-21 | Payer: MEDICARE

## 2020-05-21 ENCOUNTER — Ambulatory Visit: Admit: 2020-05-21 | Discharge: 2020-05-22 | Payer: MEDICARE

## 2020-05-21 DIAGNOSIS — Z823 Family history of stroke: Secondary | ICD-10-CM

## 2020-05-21 DIAGNOSIS — D489 Neoplasm of uncertain behavior, unspecified: Secondary | ICD-10-CM

## 2020-05-21 DIAGNOSIS — I480 Paroxysmal atrial fibrillation: Secondary | ICD-10-CM

## 2020-05-21 DIAGNOSIS — I495 Sick sinus syndrome: Secondary | ICD-10-CM

## 2020-05-21 DIAGNOSIS — B351 Tinea unguium: Secondary | ICD-10-CM

## 2020-05-21 DIAGNOSIS — F5221 Male erectile disorder: Secondary | ICD-10-CM

## 2020-05-21 DIAGNOSIS — R519 Generalized headaches: Secondary | ICD-10-CM

## 2020-05-21 DIAGNOSIS — E039 Hypothyroidism, unspecified: Secondary | ICD-10-CM

## 2020-05-21 DIAGNOSIS — H9190 Unspecified hearing loss, unspecified ear: Secondary | ICD-10-CM

## 2020-05-21 DIAGNOSIS — M1990 Unspecified osteoarthritis, unspecified site: Secondary | ICD-10-CM

## 2020-05-21 DIAGNOSIS — L578 Other skin changes due to chronic exposure to nonionizing radiation: Secondary | ICD-10-CM

## 2020-05-21 DIAGNOSIS — E785 Hyperlipidemia, unspecified: Secondary | ICD-10-CM

## 2020-05-21 DIAGNOSIS — R42 Dizziness and giddiness: Secondary | ICD-10-CM

## 2020-05-21 DIAGNOSIS — M25569 Pain in unspecified knee: Secondary | ICD-10-CM

## 2020-05-21 DIAGNOSIS — R5383 Other fatigue: Secondary | ICD-10-CM

## 2020-05-21 DIAGNOSIS — B353 Tinea pedis: Secondary | ICD-10-CM

## 2020-05-21 DIAGNOSIS — J302 Other seasonal allergic rhinitis: Secondary | ICD-10-CM

## 2020-05-21 DIAGNOSIS — I4891 Unspecified atrial fibrillation: Secondary | ICD-10-CM

## 2020-05-21 DIAGNOSIS — L570 Actinic keratosis: Secondary | ICD-10-CM

## 2020-05-21 DIAGNOSIS — I10 Essential (primary) hypertension: Secondary | ICD-10-CM

## 2020-05-21 DIAGNOSIS — L304 Erythema intertrigo: Secondary | ICD-10-CM

## 2020-05-21 DIAGNOSIS — D229 Melanocytic nevi, unspecified: Secondary | ICD-10-CM

## 2020-05-21 DIAGNOSIS — K219 Gastro-esophageal reflux disease without esophagitis: Secondary | ICD-10-CM

## 2020-05-21 DIAGNOSIS — L821 Other seborrheic keratosis: Secondary | ICD-10-CM

## 2020-05-21 DIAGNOSIS — R001 Bradycardia, unspecified: Secondary | ICD-10-CM

## 2020-05-21 DIAGNOSIS — Z7901 Long term (current) use of anticoagulants: Secondary | ICD-10-CM

## 2020-05-21 DIAGNOSIS — B079 Viral wart, unspecified: Secondary | ICD-10-CM

## 2020-05-22 DIAGNOSIS — Z85828 Personal history of other malignant neoplasm of skin: Secondary | ICD-10-CM

## 2020-05-23 ENCOUNTER — Encounter: Admit: 2020-05-23 | Discharge: 2020-05-23 | Payer: MEDICARE

## 2020-05-23 DIAGNOSIS — I480 Paroxysmal atrial fibrillation: Secondary | ICD-10-CM

## 2020-05-23 NOTE — Progress Notes
INR within goal range. No call indicated.

## 2020-06-05 ENCOUNTER — Encounter: Admit: 2020-06-05 | Discharge: 2020-06-05 | Payer: MEDICARE

## 2020-06-05 DIAGNOSIS — I480 Paroxysmal atrial fibrillation: Secondary | ICD-10-CM

## 2020-06-05 NOTE — Progress Notes
Pt states that he accidentally took an extra dose.  I reviewed plan with wife.  She said she will tell pt and will have him call us if he has any questions.

## 2020-06-07 ENCOUNTER — Encounter: Admit: 2020-06-07 | Discharge: 2020-06-07 | Payer: MEDICARE

## 2020-06-07 DIAGNOSIS — I2510 Atherosclerotic heart disease of native coronary artery without angina pectoris: Secondary | ICD-10-CM

## 2020-06-07 DIAGNOSIS — E039 Hypothyroidism, unspecified: Secondary | ICD-10-CM

## 2020-06-07 DIAGNOSIS — E782 Mixed hyperlipidemia: Secondary | ICD-10-CM

## 2020-06-07 DIAGNOSIS — I4891 Unspecified atrial fibrillation: Secondary | ICD-10-CM

## 2020-06-07 MED ORDER — OMEPRAZOLE 20 MG PO CPDR
ORAL_CAPSULE | Freq: Every day | 0 refills | Status: AC
Start: 2020-06-07 — End: ?

## 2020-06-07 NOTE — Telephone Encounter
[facility].  Last refill was 09/12/19 ( #90 x1).  Last visit was 09/12/19, next visit is 07/09/20.  This medication is under standing order protocol. Jannette Fogo, RN

## 2020-06-20 ENCOUNTER — Encounter: Admit: 2020-06-20 | Discharge: 2020-06-20 | Payer: MEDICARE

## 2020-06-20 DIAGNOSIS — I480 Paroxysmal atrial fibrillation: Secondary | ICD-10-CM

## 2020-06-20 NOTE — Progress Notes
Patient reported severe abdominal pain in right lower quadrant earlier today with chills. Patient states he was going to the ED locally and then pain subsided. He is working on getting follow up locally and awaiting a phone call back. Patient also follows with Dr Enis Slipper at [facility] and will contact their office. Patient will be in [facility] tomorrow for appointment derm appointment. Patient will decrease warfarin dose tonight and recheck INR in 1 week.

## 2020-06-20 NOTE — Telephone Encounter
Pt LVM requesting call back because he will be in [facility] tomorrow & wanted to know if there was anyone that could see him because he is suspicious that he may have appendicitis.    Spoke to pt who stated that he has been having pain in area where appendix is & he is concerned that he may have appendicitis.  Pt wanted to know if there was anyone that might be able to see him.   I informed pt that if he does feel that he is in a great deal of pain, that he should be evaluated today at either an urgent care or ER.  Pt agreed & stated that he wanted to go to ER because pain has become little worse.

## 2020-06-27 ENCOUNTER — Encounter: Admit: 2020-06-27 | Discharge: 2020-06-27 | Payer: MEDICARE

## 2020-06-27 NOTE — Progress Notes
Pt lm he took a full tablet instead of a half last week. He will take half tonight and tomorrow then recheck his INR on Monday.

## 2020-06-27 NOTE — Progress Notes
Letter received from Dr. Alphia Kava office informing of Mohs procedure completed 06/11/20 containing clinical photos of debulked area and reconstruction. Sent to be scanned into patient chart.

## 2020-06-27 NOTE — Progress Notes
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 Corliss Parish, MD. All records have been placed in media tab for viewing.  Jonathan Humphrey

## 2020-07-02 ENCOUNTER — Encounter: Admit: 2020-07-02 | Discharge: 2020-07-02 | Payer: MEDICARE

## 2020-07-02 NOTE — Progress Notes
Pt hospitalized since last office visit: No    There are no preventive care reminders to display for this patient.    There are no diagnoses linked to this encounter.    Labs not done:      Lab Frequency Next Occurrence   REQUEST FOR CARDIOLOGY APPOINTMENT Once 08/04/2016   REQUEST FOR CARDIOLOGY APPOINTMENT Once 12/09/2017   REQUEST FOR CARDIOLOGY APPOINTMENT Once 07/07/2019   REQUEST FOR CARDIOLOGY APPOINTMENT Once 03/11/2020   CBC AND DIFF Once 11/12/2019   BASIC METABOLIC PANEL Once 11/12/2019   LIPID PROFILE Once 11/12/2019   LIVER FUNCTION PANEL Once 11/12/2019   TSH WITH FREE T4 REFLEX Once 11/12/2019   HEMOGLOBIN A1C Once 11/12/2019   REQUEST FOR CARDIOLOGY APPOINTMENT Once 07/03/2020   REQUEST FOR CARDIOLOGY APPOINTMENT Once 11/07/2020   DEVICE EVALUATION - REMOTE PPM     PROTIME INR (PT) Twice a Week Auto 07/02/2020, 07/05/2020, 07/09/2020, 07/12/2020, 07/16/2020, 07/19/2020, 07/23/2020, 07/26/2020, 07/30/2020, 08/02/2020, 08/06/2020, 08/09/2020       MyChart message sent to patient on 07/13/21Angie Jean Rosenthal, RN     Notes to provider:    Answers for HPI/ROS submitted by the patient on 07/08/2020   Patient Reported Other  What medical problem brings you in to see the provider?: routine visit  Which of the following, if any, make(s) your symptom worse?: nothing  Which of the following treatments have you tried?: acetaminophen  If you've tried a treatment for this problem, how much relief did you experience?: mild

## 2020-07-03 ENCOUNTER — Encounter: Admit: 2020-07-03 | Discharge: 2020-07-03 | Payer: MEDICARE

## 2020-07-03 DIAGNOSIS — I480 Paroxysmal atrial fibrillation: Secondary | ICD-10-CM

## 2020-07-08 ENCOUNTER — Encounter: Admit: 2020-07-08 | Discharge: 2020-07-08 | Payer: MEDICARE

## 2020-07-09 ENCOUNTER — Encounter: Admit: 2020-07-09 | Discharge: 2020-07-09 | Payer: MEDICARE

## 2020-07-09 ENCOUNTER — Ambulatory Visit: Admit: 2020-07-09 | Discharge: 2020-07-09 | Payer: MEDICARE

## 2020-07-09 DIAGNOSIS — L304 Erythema intertrigo: Secondary | ICD-10-CM

## 2020-07-09 DIAGNOSIS — D489 Neoplasm of uncertain behavior, unspecified: Secondary | ICD-10-CM

## 2020-07-09 DIAGNOSIS — M1990 Unspecified osteoarthritis, unspecified site: Secondary | ICD-10-CM

## 2020-07-09 DIAGNOSIS — L578 Other skin changes due to chronic exposure to nonionizing radiation: Secondary | ICD-10-CM

## 2020-07-09 DIAGNOSIS — I4891 Unspecified atrial fibrillation: Secondary | ICD-10-CM

## 2020-07-09 DIAGNOSIS — B351 Tinea unguium: Secondary | ICD-10-CM

## 2020-07-09 DIAGNOSIS — R7301 Impaired fasting glucose: Secondary | ICD-10-CM

## 2020-07-09 DIAGNOSIS — E782 Mixed hyperlipidemia: Secondary | ICD-10-CM

## 2020-07-09 DIAGNOSIS — R001 Bradycardia, unspecified: Secondary | ICD-10-CM

## 2020-07-09 DIAGNOSIS — K219 Gastro-esophageal reflux disease without esophagitis: Secondary | ICD-10-CM

## 2020-07-09 DIAGNOSIS — Z7901 Long term (current) use of anticoagulants: Secondary | ICD-10-CM

## 2020-07-09 DIAGNOSIS — I495 Sick sinus syndrome: Secondary | ICD-10-CM

## 2020-07-09 DIAGNOSIS — Z950 Presence of cardiac pacemaker: Secondary | ICD-10-CM

## 2020-07-09 DIAGNOSIS — R42 Dizziness and giddiness: Secondary | ICD-10-CM

## 2020-07-09 DIAGNOSIS — I10 Essential (primary) hypertension: Secondary | ICD-10-CM

## 2020-07-09 DIAGNOSIS — I480 Paroxysmal atrial fibrillation: Secondary | ICD-10-CM

## 2020-07-09 DIAGNOSIS — E039 Hypothyroidism, unspecified: Secondary | ICD-10-CM

## 2020-07-09 DIAGNOSIS — L570 Actinic keratosis: Secondary | ICD-10-CM

## 2020-07-09 DIAGNOSIS — B079 Viral wart, unspecified: Secondary | ICD-10-CM

## 2020-07-09 DIAGNOSIS — M25569 Pain in unspecified knee: Secondary | ICD-10-CM

## 2020-07-09 DIAGNOSIS — E785 Hyperlipidemia, unspecified: Secondary | ICD-10-CM

## 2020-07-09 DIAGNOSIS — B353 Tinea pedis: Secondary | ICD-10-CM

## 2020-07-09 DIAGNOSIS — I2510 Atherosclerotic heart disease of native coronary artery without angina pectoris: Secondary | ICD-10-CM

## 2020-07-09 DIAGNOSIS — L821 Other seborrheic keratosis: Secondary | ICD-10-CM

## 2020-07-09 DIAGNOSIS — N401 Enlarged prostate with lower urinary tract symptoms: Secondary | ICD-10-CM

## 2020-07-09 DIAGNOSIS — R519 Generalized headaches: Secondary | ICD-10-CM

## 2020-07-09 DIAGNOSIS — F5221 Male erectile disorder: Secondary | ICD-10-CM

## 2020-07-09 DIAGNOSIS — J302 Other seasonal allergic rhinitis: Secondary | ICD-10-CM

## 2020-07-09 DIAGNOSIS — H9190 Unspecified hearing loss, unspecified ear: Secondary | ICD-10-CM

## 2020-07-09 DIAGNOSIS — Z823 Family history of stroke: Secondary | ICD-10-CM

## 2020-07-09 DIAGNOSIS — R5383 Other fatigue: Secondary | ICD-10-CM

## 2020-07-09 LAB — LIVER FUNCTION PANEL
Lab: 0.3 mg/dL (ref <0.4)
Lab: 1.2 mg/dL (ref 0.3–1.2)

## 2020-07-09 LAB — BASIC METABOLIC PANEL
Lab: 138 MMOL/L (ref <100)
Lab: 4.3 MMOL/L (ref 3.5–5.1)

## 2020-07-09 LAB — CBC AND DIFF
Lab: 100 FL — ABNORMAL HIGH (ref 80–100)
Lab: 13 % (ref >60)
Lab: 196 10*3/uL (ref >60)
Lab: 33 g/dL (ref 32.0–36.0)
Lab: 34 pg — ABNORMAL HIGH (ref 26–34)
Lab: 4.4 M/UL (ref 4.4–5.5)
Lab: 44 % — ABNORMAL HIGH (ref 40–50)
Lab: 6.4 K/UL (ref 4.5–11.0)

## 2020-07-09 LAB — TSH WITH FREE T4 REFLEX: Lab: 1.2 uU/mL (ref 0.35–5.00)

## 2020-07-09 LAB — LIPID PROFILE
Lab: 120 mg/dL (ref <200)
Lab: 48 mg/dL (ref >40)
Lab: 69 mg/dL (ref <150)

## 2020-07-09 LAB — PROTIME INR (PT): Lab: 2.3 g/dL — ABNORMAL HIGH (ref 0.8–1.2)

## 2020-07-09 LAB — HEMOGLOBIN A1C: Lab: 5.6 % (ref 4.0–6.0)

## 2020-07-09 NOTE — Progress Notes
Called and spoke with Huntley Dec. She states she will relay the message and see if he wants to re-check in 1 week or two weeks.
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
## 2020-07-09 NOTE — Progress Notes
Called and LM  Called pt back and he would like to check in two weeks.

## 2020-07-09 NOTE — Patient Instructions
Get fasting labs few days before return to clinic in 3 months

## 2020-07-10 ENCOUNTER — Encounter: Admit: 2020-07-10 | Discharge: 2020-07-10 | Payer: MEDICARE

## 2020-07-10 NOTE — Telephone Encounter
Called pt and was not able to lm on his phone.

## 2020-07-10 NOTE — Telephone Encounter
-----   Message from Reubin Milan, MD sent at 07/09/2020  3:18 PM CDT -----  Melanie-the lipids look improved.  The INR is 2.3.  Please continue with the current plan.  Thanks RG

## 2020-07-16 ENCOUNTER — Encounter: Admit: 2020-07-16 | Discharge: 2020-07-16 | Payer: MEDICARE

## 2020-07-16 NOTE — Progress Notes
In range so no call placed.

## 2020-07-22 ENCOUNTER — Encounter: Admit: 2020-07-22 | Discharge: 2020-07-22 | Payer: MEDICARE

## 2020-07-22 DIAGNOSIS — I480 Paroxysmal atrial fibrillation: Secondary | ICD-10-CM

## 2020-07-22 NOTE — Patient Instructions
Pt/MM

## 2020-07-26 ENCOUNTER — Encounter: Admit: 2020-07-26 | Discharge: 2020-07-26 | Payer: MEDICARE

## 2020-07-26 NOTE — Progress Notes
Mohs surgery performed by Dr. Corliss Parish on 07/10/2020 to remove basal cell carcinoma on right chin. Dr. Alphia Kava office faxed procedure note from visit and this has been scanned into patient chart for future reference.  Romero Liner, LPN

## 2020-07-29 ENCOUNTER — Encounter: Admit: 2020-07-29 | Discharge: 2020-07-29 | Payer: MEDICARE

## 2020-07-29 DIAGNOSIS — E782 Mixed hyperlipidemia: Secondary | ICD-10-CM

## 2020-07-29 DIAGNOSIS — E039 Hypothyroidism, unspecified: Secondary | ICD-10-CM

## 2020-07-29 DIAGNOSIS — I2510 Atherosclerotic heart disease of native coronary artery without angina pectoris: Secondary | ICD-10-CM

## 2020-07-29 DIAGNOSIS — I4891 Unspecified atrial fibrillation: Secondary | ICD-10-CM

## 2020-07-29 MED ORDER — EZETIMIBE 10 MG PO TAB
ORAL_TABLET | Freq: Every day | 1 refills | Status: AC
Start: 2020-07-29 — End: ?

## 2020-07-29 NOTE — Progress Notes
In range no changes needed.

## 2020-07-29 NOTE — Telephone Encounter
Refill request for ezetimibe 10mg  #90x1.  Last filled on 01/12/2020 # 90X1.   LOV on 07/09/2020.   NOV on 08/22/2020.     Hepatic Function    Lab Results   Component Value Date/Time    ALBUMIN 4.1 07/09/2020 08:10 AM    TOTPROT 6.4 07/09/2020 08:10 AM    ALKPHOS 38 07/09/2020 08:10 AM    Lab Results   Component Value Date/Time    AST 19 07/09/2020 08:10 AM    ALT 26 07/09/2020 08:10 AM    TOTBILI 1.2 07/09/2020 08:10 AM          This is a Standing order and the patient meets the Standing order Protocol. Refill was sent to patients preferred pharmacy.Donnella Bi, RN

## 2020-08-01 ENCOUNTER — Encounter: Admit: 2020-08-01 | Discharge: 2020-08-01 | Payer: MEDICARE

## 2020-08-01 NOTE — Progress Notes
Mohs surgery performed by Dr. Corliss Parish on 07/17/2020 to remove basal cell carcinoma on margin of right nasal ala. Dr. Alphia Kava office faxed procedure note from visit and this has been scanned into patient chart for future reference.  Romero Liner, LPN

## 2020-08-02 ENCOUNTER — Encounter: Admit: 2020-08-02 | Discharge: 2020-08-02 | Payer: MEDICARE

## 2020-08-06 ENCOUNTER — Encounter: Admit: 2020-08-06 | Discharge: 2020-08-06 | Payer: MEDICARE

## 2020-08-06 ENCOUNTER — Ambulatory Visit: Admit: 2020-08-06 | Discharge: 2020-08-07 | Payer: MEDICARE

## 2020-08-06 DIAGNOSIS — B079 Viral wart, unspecified: Secondary | ICD-10-CM

## 2020-08-06 DIAGNOSIS — I480 Paroxysmal atrial fibrillation: Secondary | ICD-10-CM

## 2020-08-06 DIAGNOSIS — Z823 Family history of stroke: Secondary | ICD-10-CM

## 2020-08-06 DIAGNOSIS — L570 Actinic keratosis: Secondary | ICD-10-CM

## 2020-08-06 DIAGNOSIS — R001 Bradycardia, unspecified: Secondary | ICD-10-CM

## 2020-08-06 DIAGNOSIS — Z950 Presence of cardiac pacemaker: Secondary | ICD-10-CM

## 2020-08-06 DIAGNOSIS — D489 Neoplasm of uncertain behavior, unspecified: Secondary | ICD-10-CM

## 2020-08-06 DIAGNOSIS — E039 Hypothyroidism, unspecified: Secondary | ICD-10-CM

## 2020-08-06 DIAGNOSIS — Z7901 Long term (current) use of anticoagulants: Secondary | ICD-10-CM

## 2020-08-06 DIAGNOSIS — L304 Erythema intertrigo: Secondary | ICD-10-CM

## 2020-08-06 DIAGNOSIS — L821 Other seborrheic keratosis: Secondary | ICD-10-CM

## 2020-08-06 DIAGNOSIS — B351 Tinea unguium: Secondary | ICD-10-CM

## 2020-08-06 DIAGNOSIS — K219 Gastro-esophageal reflux disease without esophagitis: Secondary | ICD-10-CM

## 2020-08-06 DIAGNOSIS — L578 Other skin changes due to chronic exposure to nonionizing radiation: Secondary | ICD-10-CM

## 2020-08-06 DIAGNOSIS — R5383 Other fatigue: Secondary | ICD-10-CM

## 2020-08-06 DIAGNOSIS — M1990 Unspecified osteoarthritis, unspecified site: Secondary | ICD-10-CM

## 2020-08-06 DIAGNOSIS — B353 Tinea pedis: Secondary | ICD-10-CM

## 2020-08-06 DIAGNOSIS — M25569 Pain in unspecified knee: Secondary | ICD-10-CM

## 2020-08-06 DIAGNOSIS — E785 Hyperlipidemia, unspecified: Secondary | ICD-10-CM

## 2020-08-06 DIAGNOSIS — R42 Dizziness and giddiness: Secondary | ICD-10-CM

## 2020-08-06 DIAGNOSIS — I10 Essential (primary) hypertension: Secondary | ICD-10-CM

## 2020-08-06 DIAGNOSIS — I4891 Unspecified atrial fibrillation: Secondary | ICD-10-CM

## 2020-08-06 DIAGNOSIS — I495 Sick sinus syndrome: Secondary | ICD-10-CM

## 2020-08-06 DIAGNOSIS — F5221 Male erectile disorder: Secondary | ICD-10-CM

## 2020-08-06 DIAGNOSIS — R519 Generalized headaches: Secondary | ICD-10-CM

## 2020-08-06 DIAGNOSIS — J302 Other seasonal allergic rhinitis: Secondary | ICD-10-CM

## 2020-08-06 DIAGNOSIS — H9190 Unspecified hearing loss, unspecified ear: Secondary | ICD-10-CM

## 2020-08-07 DIAGNOSIS — I472 Ventricular tachycardia: Principal | ICD-10-CM

## 2020-08-15 NOTE — Progress Notes
Pt hospitalized since last office visit: No    There are no preventive care reminders to display for this patient.    There are no diagnoses linked to this encounter.    Labs not done:      Lab Frequency Next Occurrence   REQUEST FOR CARDIOLOGY APPOINTMENT Once 08/04/2016   REQUEST FOR CARDIOLOGY APPOINTMENT Once 12/09/2017   REQUEST FOR CARDIOLOGY APPOINTMENT Once 07/07/2019   REQUEST FOR CARDIOLOGY APPOINTMENT Once 03/11/2020   REQUEST FOR CARDIOLOGY APPOINTMENT Once 11/07/2020   CBC AND DIFF Once 09/09/2020   BASIC METABOLIC PANEL Once 09/09/2020   HEMOGLOBIN A1C Once 09/09/2020   LIPID PROFILE Once 09/09/2020   LIVER FUNCTION PANEL Once 09/09/2020   TSH WITH FREE T4 REFLEX Once 09/09/2020   DEVICE EVALUATION - PPM Once 02/06/2021   REQUEST FOR CARDIOLOGY APPOINTMENT Once 02/06/2021   DEVICE EVALUATION - REMOTE PPM     PROTIME INR (PT) Twice a Week Auto 08/16/2020, 08/20/2020, 08/23/2020, 08/27/2020, 08/30/2020, 09/03/2020, 09/06/2020, 09/10/2020, 09/13/2020, 09/17/2020, 09/20/2020, 09/24/2020       No labs due at this time.  Wellness visit.    Notes to provider:

## 2020-08-22 ENCOUNTER — Ambulatory Visit: Admit: 2020-08-22 | Discharge: 2020-08-23 | Payer: MEDICARE

## 2020-08-22 ENCOUNTER — Encounter: Admit: 2020-08-22 | Discharge: 2020-08-22 | Payer: MEDICARE

## 2020-08-22 DIAGNOSIS — K219 Gastro-esophageal reflux disease without esophagitis: Secondary | ICD-10-CM

## 2020-08-22 DIAGNOSIS — J302 Other seasonal allergic rhinitis: Secondary | ICD-10-CM

## 2020-08-22 DIAGNOSIS — Z0000 Encounter for general adult medical examination without abnormal findings: Secondary | ICD-10-CM

## 2020-08-22 DIAGNOSIS — R001 Bradycardia, unspecified: Secondary | ICD-10-CM

## 2020-08-22 DIAGNOSIS — E785 Hyperlipidemia, unspecified: Secondary | ICD-10-CM

## 2020-08-22 DIAGNOSIS — L578 Other skin changes due to chronic exposure to nonionizing radiation: Secondary | ICD-10-CM

## 2020-08-22 DIAGNOSIS — B351 Tinea unguium: Secondary | ICD-10-CM

## 2020-08-22 DIAGNOSIS — I495 Sick sinus syndrome: Secondary | ICD-10-CM

## 2020-08-22 DIAGNOSIS — F5221 Male erectile disorder: Secondary | ICD-10-CM

## 2020-08-22 DIAGNOSIS — L570 Actinic keratosis: Secondary | ICD-10-CM

## 2020-08-22 DIAGNOSIS — I10 Essential (primary) hypertension: Secondary | ICD-10-CM

## 2020-08-22 DIAGNOSIS — D489 Neoplasm of uncertain behavior, unspecified: Secondary | ICD-10-CM

## 2020-08-22 DIAGNOSIS — M25569 Pain in unspecified knee: Secondary | ICD-10-CM

## 2020-08-22 DIAGNOSIS — I4891 Unspecified atrial fibrillation: Secondary | ICD-10-CM

## 2020-08-22 DIAGNOSIS — M1990 Unspecified osteoarthritis, unspecified site: Secondary | ICD-10-CM

## 2020-08-22 DIAGNOSIS — Z823 Family history of stroke: Secondary | ICD-10-CM

## 2020-08-22 DIAGNOSIS — R519 Generalized headaches: Secondary | ICD-10-CM

## 2020-08-22 DIAGNOSIS — Z6831 Body mass index (BMI) 31.0-31.9, adult: Secondary | ICD-10-CM

## 2020-08-22 DIAGNOSIS — E039 Hypothyroidism, unspecified: Secondary | ICD-10-CM

## 2020-08-22 DIAGNOSIS — R42 Dizziness and giddiness: Secondary | ICD-10-CM

## 2020-08-22 DIAGNOSIS — B079 Viral wart, unspecified: Secondary | ICD-10-CM

## 2020-08-22 DIAGNOSIS — R5383 Other fatigue: Secondary | ICD-10-CM

## 2020-08-22 DIAGNOSIS — L821 Other seborrheic keratosis: Secondary | ICD-10-CM

## 2020-08-22 DIAGNOSIS — Z7901 Long term (current) use of anticoagulants: Secondary | ICD-10-CM

## 2020-08-22 DIAGNOSIS — B353 Tinea pedis: Secondary | ICD-10-CM

## 2020-08-22 DIAGNOSIS — I480 Paroxysmal atrial fibrillation: Secondary | ICD-10-CM

## 2020-08-22 DIAGNOSIS — L304 Erythema intertrigo: Secondary | ICD-10-CM

## 2020-08-22 DIAGNOSIS — H9190 Unspecified hearing loss, unspecified ear: Secondary | ICD-10-CM

## 2020-08-22 NOTE — Progress Notes
Annual Wellness Visit Providing Personalized Prevention Plan Services    Obtained patient's verbal consent to treat them and their agreement to [facility] financial policy and NPP via this telehealth visit during the [facility]     Jonathan Humphrey is 86 y.o. patient who is presenting for annual wellness visit today.    Health Risk Assessment was conducted during the wellness visit and assessed the following functional and safety areas below:  1- Living arrangements - the patient With family:   Wife   2- Hearing impairment: Yes   3- Any limitations in functional ability to perform activities of daily living (ADLs): none    4- Independance level with activities of daily living: is generally independent in ADLs  5- Ability to successfully perform instrumental activities of daily living: Care giving , Community mobility  Driving, Armed forces training and education officer, Development worker, international aid, Communication mangement  speech, handwriting, email and phone, Meal preparation, Shopping, Home management  6- Behavioral and psychological risks: Patient was asked if he feels depressed:  None  7- Patient's self-assessment of overall health: well  8- falls assessment:  How often did the patient fall in the last month? None  9- Ability to successfully perform activities of daily living: no assistance needed     1. Medical and family history:   Medical History:   Diagnosis Date   ? AK (actinic keratosis)     scalp   ? Arthritis    ? Atrial fibrillation (HCC) 01/16/2009   ? Chronic anticoagulation 01/16/2009   ? Coronary atherosclerosis 09/01/2006    Coronary artery disease   ? Cyst     right upper back   ? Dizziness 01/19/2007   ? 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 Essential hypertension 04/11/2009   ? Family history of cerebrovascular accident (CVA) 01/19/2007   ? Fatigue 07/21/2007   ? Generalized headaches    ? GERD (gastroesophageal reflux disease) 12/30/2006   ? Hearing loss 01/19/2007   ? HLD (hyperlipidemia) 04/11/2009   ? Hyperlipidemia 09/01/2006   ? Hypertension, essential 09/01/2006   ? Hypothyroidism 11/30/2007   ? Intertrigo    ? Knee pain 12/30/2006   ? Neoplasm of uncertain behavior    ? Onychomycosis    ? PAF (paroxysmal atrial fibrillation) (HCC) 04/11/2009   ? Photoaging of skin    ? Seasonal allergic reaction    ? Sinus bradycardia    ? SK (seborrheic keratosis)     face, scalp, right ear   ? SSS (sick sinus syndrome) (HCC)    ? Tinea pedis    ? Verruca vulgaris      Surgical History:   Procedure Laterality Date   ? LEFT HEART CATHETERIZATION  05/1994    no stents   ? CORONARY ANGIOPLASTY  05/1994    [facility], no stents   ? SKIN BIOPSY  09/24/2006    shave biopsy   ? CARDIOVERSION  03/11/2009   ? 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 KNEE REPLACEMENT Right 10/09/14   ? 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 Fluoroscopy N/A 10/21/2015    Performed by Smith Robert, MD at [facility] EP LAB   ? REPAIR HERNIA UMBILICAL N/A 01/13/2016    Performed by Simonne Martinet, MD at [facility] OR   ? REPAIR HERNIA INGUINAL Bilateral 01/13/2016  Performed by Simonne Martinet, MD at [facility] OR   ? SKIN LESION REMOVAL  2018    3 spots on face by outside facility   ? ANGIOGRAPHY CORONARY ARTERY N/A 05/10/2017    Performed by Greig Castilla, MD at [facility] CATH LAB   ? POSSIBLE PERCUTANEOUS CORONARY STENT PLACEMENT WITH ANGIOPLASTY N/A 05/10/2017    Performed by Greig Castilla, MD at [facility] CATH LAB   ? ANGIOGRAPHY CORONARY ARTERY WITH LEFT HEART CATHETERIZATION N/A 07/05/2019    Performed by Harley Alto, MD at [facility] CATH LAB   ? POSSIBLE PERCUTANEOUS CORONARY STENT PLACEMENT WITH ANGIOPLASTY N/A 07/05/2019    Performed by Harley Alto, MD at [facility] CATH LAB     He indicated that his mother is deceased. He indicated that his father is deceased.    No Known Allergies  Social History     Socioeconomic History   ? Marital status: Married Spouse name: Not on file   ? Number of children: Not on file   ? Years of education: Not on file   ? Highest education level: Not on file   Occupational History   ? Not on file   Tobacco Use   ? Smoking status: Never Smoker   ? Smokeless tobacco: Never Used   Vaping Use   ? Vaping Use: Never used   Substance and Sexual Activity   ? Alcohol use: Yes     Alcohol/week: 7.0 standard drinks     Types: 7 Shots of liquor per week     Comment: 2-3 ounces daily   ? Drug use: No   ? Sexual activity: Not on file   Other Topics Concern   ? Not on file   Social History Narrative   ? Not on file     Current Outpatient Medications on File Prior to Visit   Medication Sig Dispense Refill   ? aspirin EC 81 mg tablet Take 1 tablet by mouth daily. Take with food. 90 tablet 3   ? atorvastatin (LIPITOR) 40 mg tablet Take one tablet by mouth at bedtime daily. 90 tablet 1   ? cholecalciferol (VITAMIN D-3) 50 mcg (2,000 unit) tablet Take 2,000 Units by mouth daily.     ? ezetimibe (ZETIA) 10 mg tablet TAKE (1) TABLET BY MOUTH ONCE DAILY 90 tablet 1   ? finasteride (PROSCAR) 5 mg tablet Take one tablet by mouth daily. 90 tablet 3   ? levothyroxine (SYNTHROID) 50 mcg tablet TAKE (1) TABLET BY MOUTH ONCE DAILY 90 tablet 1   ? lisinopriL (ZESTRIL) 20 mg tablet TAKE (1) TABLET BY MOUTH ONCE DAILY 90 tablet 3   ? metoprolol XL (TOPROL XL) 50 mg extended release tablet Take one tablet by mouth at bedtime daily. 90 tablet 3   ? OMEGA-3 FATTY ACIDS/FISH OIL (FISH OIL-OMEGA-3 FATTY ACIDS PO) Take 3 tablets by mouth daily.     ? omeprazole DR (PRILOSEC) 20 mg capsule TAKE (1)CAPSULE BY MOUTH ONCE DAILY 90 capsule 0   ? tamsulosin (FLOMAX) 0.4 mg capsule TAKE 2 CAPSULES 30 MINUTES FOLLOWING THE SAME MEAL DAILY. DO NOT CRUSH, CHEW OR OPEN CAPSULES. 180 capsule 1   ? warfarin (COUMADIN) 4 mg tablet TAKE 1 TABLET DAILY OR AS INSTRUCTED BY DR. Vivianne Spence 90 tablet 3     No current facility-administered medications on file prior to visit.       Recent Hospital admissions within last 6 months: No    2. List of current providers and suppliers:   The  list of current providers that are regularly involved in providing medical care to this patient is:   Cardiologist- Dr. Vivianne Spence  Dermatologist- Dr. Evie Lacks  Orthopedics- Dr. Caryn Section  Sports Medicine- Dr. Hartley Barefoot  Opthalmology Dr. Catalina Antigua, office visit in December to follow up on retinal detachment    3. Height, weight, BMI,BP  There were no vitals filed for this visit.  Estimated body mass index is 29.86 kg/m? as calculated from the following:    Height as of 07/09/20: 172.1 cm (67.76).    Weight as of 07/09/20: 88.5 kg (195 lb).  Discussed patient's BMI with him.  The BMI is above average; drinks Atkins in the morning, referral to nutritionist placed to discuss better food alternatives and cardiac diet. 10 years ago weight started going up and having trouble losing. Pt desires to lose about 20 lbs (182 lbs).    4. Detection of any cognitive impairment   Evaluation of cognitive function:  Mood: within normal limits  affect /Appearance: well developed, well nourished, in no acute distress  Family member/caregiver with patient during wellness visit today: None  Family member/caregiver input (if any): n/a    Three Item Recall: Ask the patient to repeat and remember three words: apple, tree, watch. Have the patient repeat the words immediately after you tell them the words. Then administer the 'Draw a Clock Test'. When that test is completed, ask the patient to tell you the three words you asked him/her to remember.  # Items Patient Recalled (0-3): 3   (Scoring Key: 0 positive screen for dementia, 1-2 positive screen for dementia if accompanied by other cognitive impairment ; 3 normal).    5. Screening for depression:  (Review of a patient's potential risk factors for depression, including current or past experiences with depression or other mood disorders, based on the use of an appropriate screening instrument for persons without a current diagnosis of depression, which the provider may select from various available standardized screening tests designed for this purpose and recognized by national professional medical organization. Clinic willuse PHQ 2 as a screening tool. If there are any yes answers; it is recommended to conduct a more comprehensive depression screening using PHQ 9)  Depression screening  PHQ2:  1. Over the past two weeks, have you felt down, depressed or hopeless?  No  2.  Over the past two weeks, have you felt little interest or pleasure in doing things? No    6. Assessments of hearing, self-care abilities, fall risk, home safety:   Get Up and Go Test Time:  7 seconds  ?Pt sits in chair, stands up without using the arms and walks 10 feet. Time in seconds, watch for wobble.  <11 sec nl, >20sec abnl...?    Functional ability/Safety screening   1. Was the patient?s timed Up & Go test unsteady or longer than 30 seconds? No  2. Do you need help with the phone, transportation, shopping, preparing meals, housework, laundry, medications or managing money? No  3. Does your home have rugs in the hallway, lack grab bars in the bathroom, lack handrails on the stairs or have poor lighting? No  4. Have you noticed any hearing difficulties? Yes, hearing aids bilateral and wears them when needed   5. Hearing evaluation referral is indicated at this time: No    Lung Cancer Screening assessment eligibility    Age  Age between 8-78 years old Patient's age: 86 y.o.  No   Asymptomatic Patient has no symptoms of lung cancer  or a previous diagnosis of lung cancer?  Yes    Smoking history amount Tobacco smoking history of at least 30 pack-years (one pack-year = smoking one pack per day for one year; 1 pack = 20 cigarettes); No   Smoking status Current smoker or one who has quit smoking within the last 15 years; No   Screening Decision: If all answers are YES, then discuss and order Referral for Lung Cancer Screening.    I am referring [name] for a lung cancer screening.  Leone Payor Casserly will be screened with CMS-mandated eligibility requirements.   If he meets the criteria, he will receive a shared decision-making counseling visit including education and decision aids for low dose CT (LDCT) as outlined by CMS, smoking cessation counseling, and will be appropriately consented.   The patient will be managed through the Lung Cancer Screening Program for all follow-ups and annual lung cancer LDCT screening.  Referral to Lung Cancer Screening Program:  No       7. 5-10 year preventive service plan was discussed with the patient as below : a copy was given to the patient with explanation of  preventive services in the next 5-10 years:   Preventive Service Frequency  Last Done    Body Mass Index (BMI)_Estimated body mass index is 29.86 kg/m? as calculated from the following:    Height as of 07/09/20: 172.1 cm (67.76).    Weight as of 07/09/20: 88.5 kg (195 lb).___     Annually 08/22/20   Blood Pressure __140_____/__80_____     ? Every 2 yrs, if BP </= 120/80 mm hg;  ? Annually, if BP >120-139/80-89 mm hg 08/22/20   Vision   ? Every 3 yrs up to age 75;  ? Every 2 yrs aged 40+ 11/2019   Cholesterol Testing Regularly beginning at age 44 with risk factors 06/2020   Diabetes Screening With a sustained BP >/= 135/80 mm Hg 07/09/20   Colorectal Cancer Screening ? Annually, Fecal Occult Blood Stool (FOBS);  ? Every 5 yrs, Sigmoidoscopy with FOBS;  Every 10 yrs, Colonoscopy (start age 84 and stop at 14 ) Not required    Sexually Transmitted Diseases (STD?s) ? As necessary for those with risk factors N/A   Hepatitis C screening for people born between 1945 and 1965 Once in lifetime Not required   Depression Screening As necessary for those with risk factors 08/22/20   Alcohol Misuse Screening As necessary for those with risk factors 08/22/20   Immunizations:     Pneumococcal (Pneumonia) Vaccine     Influenza (Flu) Vaccine ? Pneumonia: 1-2 doses up to age 35;  ? Pneumonia: 1 dose age 48+  Influenza: Annually Done:  Prevnar 08/07/15  Pneumovax 10/12/13     MALES: Abdominal Aortic Aneurysm ? Once, between the age range of 81-75 and smoked 100+ cigarettes in lifetime N/A   FEMALES: Breast Cancer Screening (Mammogram) Every 1-2 yrs, aged 74-74 yrs N/A   FEMALES: Cervical Cancer Screening (Pap Smear) ? Every 3 yrs, aged 6-64 yrs;  Every 5 yrs, aged 94-65 with HPV testing N/A   FEMALES: Osteoporosis Screening (Bone Density Measurement) ? Routinely, for women aged 65+ every 2 years  ? Routinely, for women aged 60-64 with risk factors N/A   Your major risk factors:   Family history of high cholesterol, high blood pressure, stroke and cancer   Obesity - BMI 31.12   Diabetes-  weight   Hypertension- managed with lisinopril  Fall Risk- low  fall risk  Smoking Use: n/a    Other: Atrial Fibrillation and skin cancers  Recommendations for improvement:   Diet- nutrition referral placed    Tobacco Cessation- n/a   Weight Management- n/a   Exercise- walk dog a mile (M-F), shorter walks now due to dog  Other- lose weight, goal is 182lbs (20 lb loss)     8. List of risk factors and interventions:   Patient was given specific education handout as below related to fire safety, driving safety, suicide risk, osteoporosis prevention, and falls prevention. Patient was able to ask questions about those risks and what the patient can do to avoid or prevent.   ===========================================================  Patient Education Handout  Fire Safety  The Facts:  ? Cooking is the primary cause of home fires.  ? Smoking is the leading cause of fire-related deaths.  ? 80% of [place] fire deaths occur in home fires.  ? 50% of home fire deaths occur in homes without smoke alarms.  ? Most home fires occur during winter months.  ? Alcohol contributes to an estimated 40% of home related fire deaths.  Who is at greatest risk?  ? Children under the age of 16.  ? Adults 97 and older.  ? African Americans and Native Americans.  ? Persons living in rural areas.  ? Persons living in manufactured homes or substandard housing.  What can you do?  ? Be sure your home is equipped with a functioning Smoke and Carbon Monoxide alarm.  ? Do not smoke.  ? Do not drink to excess.  ? Monitor your stove, oven, and kitchen appliances.  ? Have a functioning fire extinguisher in your kitchen.    Driving Safety  The Facts:  ? Motor vehicle-related deaths and injuries among older adults are rising.  ? Drivers 12 and older have higher crash death rates per mile than all but teen drivers.  ? The 25 and older population is the fastest growing segment of the population.  ? Older drivers who are injured in a motor vehicle accident are more likely than younger drivers to die of their injuries.  ? Rates for motor vehicle-related injury are twice as high for older men than for older women.  What can you do?  ? Wear you seatbelt in the car ? all the time.  ? Be sure that your vision and hearing have been tested and are satisfactory.  ? Do not drink and drive.  ? Talk with family about your driving skills and consider their advice when assessing your driving skills and safety.  ? Do not talk on a cell phone and drive at the same time.    Suicide Risk  The Facts:  ? Suicide rates increase with age and rates are high among those over 38.  ? Older adults who are suicidal are also more likely to be suffering from  ? Physical illnesses and to be divorced or widowed.  ? Older men are more likely to commit suicide than older women.  ? Firearms are used in the majority of suicides committed by older adults.  What can you do?  ? Seek care if you are depressed.  ? Seek social supports such as family, church, [employer], and friends if you are ill, divorced, or living alone.  Remember that depression is a medical illness and not a personal failing or weakness.  There are effective treatments for depression and your medical providers are interested in helping you if you are depressed.  Osteoporosis Prevention  The Facts:  ? Osteoporosis is more common among older adults.  ? Women have a higher rate of osteoporosis than men.  ? The presence of osteoporosis increases the risk of injury from a fall.  ? Smoking is a risk factor for the development of osteoporosis.  What can you do?  ? Participate in a regular exercise program such as walking  ? Be sure that you are taking 1,000 to 1,500 mg of calcium per day, preferably with vitamin D  ? Do not smoke.  ? Do not drink alcohol to excess.  ? Consider having a bone density test to look for osteoporosis.    Falls Prevention  The Facts:  ? More than one-third of adults 26 and older fall each year.  ? Among older Americans, falls are the leading cause of injury deaths and the most common cause of non-fatal injury and hospital admission for trauma.  ? Of those older adults who fall, 20% to 30% suffer moderate to severe injuries such as hip fractures or head trauma that reduce mobility and independence, and increase the risk of premature death.  ? Falls are the leading cause of traumatic brain injuries.  ? Among older adults, the majority of fractures are caused by falls.  ? White men have the highest fall-related death rates, followed by white women, black men, and then black women.  ? Women sustain about 80% of all hip fractures.  ? Of all fall-related fractures, hip fractures cause the greatest number of deaths and lead to the most severe health problems and reduced quality of life.  ? Up to 25% of community-dwelling older adults who sustain a hip fracture remain institutionalized for at least one year.  Who is at risk?  ? As noted above, adults over the age of 38 have an increased risk, but that risk is even higher among those older than 50.  ? Those with lower body weakness.  ? Those with problems walking and balance.  ? Those who are on four or more medications or any psychoactive medications.  ? Those who drink excessively.  What can you do?  ? Increase lower body strength and balance through regular physical activity and exercise.  ? Review all of your medications with your provider regularly to see if any can be eliminated or the dose reduced.  ? Have your vision checked regularly.  ? Remove tripping hazards in your home such as clutter in the hallways and on the stairs.  ? Remove throw rugs.  ? Use non-slip mats in the bathtub and on shower floors.  ? Have grab bars next to the toilet and in the tub or shower.  ? Have handrails on both sides of the stairway  ? Be sure that the lighting is adequate throughout your home.  ? Do not drink alcohol to excess.  ? If you require the assistance of a cane or walker, use it all the time.  ============================================================    9. Health advice and referrals:   It was discussed with patient  preventive counseling services or programs aimed at reducing identified risk factors and improving self-management, or community-based lifestyle interventions to reduce health risks and promote self-management and wellness, including weight management, physical activity, smoking cessation, fall prevention, and nutrition.  Referrals: Recommended to complete Shingles vaccine and Tdap booster through pharmacy. Provided information in AVS. Referral to Nutrition placed for BMI 31.12 and weight loss.     10. VOLUNTARY: Advance directive :  It was discussed with the patient the importance of advance directive and how the patient can prepare an advance directive in the case where an injury or illness causes the individual to be unable to make health care decisions. The patient was also given the resource and the referral option to our clinic social worker to provide support and guidance on how to get an advance directive. Completed Advance Care Planning/Resuscitation Status Conversation during office visit on 12/09/2016.    Staff conducting initial intake: Lucile Crater, MD      Physician: Lucile Crater, MD    I reviewed and approved orders and components above of annual wellness visit and the personalized prevention plan services for this patient.

## 2020-08-22 NOTE — Telephone Encounter
Called and spoke with Huntley Dec. I reminded her that Deniece Portela is due for an INR. She verbalized understanding and said she would let him know.

## 2020-08-22 NOTE — Progress Notes
Wayne returned call and reported today's INR of 2.5.    Returned call to [employer]. He confirms dosing and will recheck on 09/05/20.    He had no further questions or concerns at this time.

## 2020-08-22 NOTE — Patient Instructions
5-10 year preventive service plan was discussed with the patient as below : a copy was given to the patient with explanation of  preventive services in the next 5-10 years:   Preventive Service Frequency  Last Done    Body Mass Index (BMI)_Estimated body mass index is 29.86 kg/m? as calculated from the following:    Height as of 07/09/20: 172.1 cm (67.76).    Weight as of 07/09/20: 88.5 kg (195 lb).___     Annually 08/22/20   Blood Pressure __140_____/__80_____     ? Every 2 yrs, if BP </= 120/80 mm hg;  ? Annually, if BP >120-139/80-89 mm hg 08/22/20   Vision   ? Every 3 yrs up to age 93;  ? Every 2 yrs aged 53+ 11/2019   Cholesterol Testing Regularly beginning at age 2 with risk factors 06/2020   Diabetes Screening With a sustained BP >/= 135/80 mm Hg 07/09/20   Colorectal Cancer Screening ? Annually, Fecal Occult Blood Stool (FOBS);  ? Every 5 yrs, Sigmoidoscopy with FOBS;  Every 10 yrs, Colonoscopy (start age 60 and stop at 33 ) Not required    Sexually Transmitted Diseases (STD?s) ? As necessary for those with risk factors N/A   Hepatitis C screening for people born between 1945 and 1965 Once in lifetime Not required   Depression Screening As necessary for those with risk factors 08/22/20   Alcohol Misuse Screening As necessary for those with risk factors 08/22/20   Immunizations:     Pneumococcal (Pneumonia) Vaccine     Influenza (Flu) Vaccine ? Pneumonia: 1-2 doses up to age 27;  ? Pneumonia: 1 dose age 47+  Influenza: Annually Done:  Prevnar 08/07/15  Pneumovax 10/12/13     MALES: Abdominal Aortic Aneurysm ? Once, between the age range of 56-75 and smoked 100+ cigarettes in lifetime N/A   FEMALES: Breast Cancer Screening (Mammogram) Every 1-2 yrs, aged 29-74 yrs N/A   FEMALES: Cervical Cancer Screening (Pap Smear) ? Every 3 yrs, aged 60-64 yrs;  Every 5 yrs, aged 45-65 with HPV testing N/A   FEMALES: Osteoporosis Screening (Bone Density Measurement) ? Routinely, for women aged 65+ every 2 years  ? Routinely, for women aged 60-64 with risk factors N/A   Your major risk factors:   Family history of high cholesterol, high blood pressure, stroke and cancer   Obesity - BMI 31.12   Diabetes-  weight   Hypertension- managed with lisinopril  Fall Risk- low fall risk  Smoking Use: n/a    Other: Atrial Fibrillation and skin cancers  Recommendations for improvement:   Diet- nutrition referral placed    Tobacco Cessation- n/a   Weight Management- n/a   Exercise- walk dog a mile (M-F), shorter walks now due to dog  Other- lose weight, goal is 182lbs (20 lb loss)     8. List of risk factors and interventions:   Patient was given specific education handout as below related to fire safety, driving safety, suicide risk, osteoporosis prevention, and falls prevention. Patient was able to ask questions about those risks and what the patient can do to avoid or prevent.   ===========================================================  Patient Education Handout  Fire Safety  The Facts:  ? Cooking is the primary cause of home fires.  ? Smoking is the leading cause of fire-related deaths.  ? 80% of [place] fire deaths occur in home fires.  ? 50% of home fire deaths occur in homes without smoke alarms.  ? Most home fires occur  during winter months.  ? Alcohol contributes to an estimated 40% of home related fire deaths.  Who is at greatest risk?  ? Children under the age of 71.  ? Adults 69 and older.  ? African Americans and Native Americans.  ? Persons living in rural areas.  ? Persons living in manufactured homes or substandard housing.  What can you do?  ? Be sure your home is equipped with a functioning Smoke and Carbon Monoxide alarm.  ? Do not smoke.  ? Do not drink to excess.  ? Monitor your stove, oven, and kitchen appliances.  ? Have a functioning fire extinguisher in your kitchen.    Driving Safety  The Facts:  ? Motor vehicle-related deaths and injuries among older adults are rising.  ? Drivers 39 and older have higher crash death rates per mile than all but teen drivers.  ? The 10 and older population is the fastest growing segment of the population.  ? Older drivers who are injured in a motor vehicle accident are more likely than younger drivers to die of their injuries.  ? Rates for motor vehicle-related injury are twice as high for older men than for older women.  What can you do?  ? Wear you seatbelt in the car ? all the time.  ? Be sure that your vision and hearing have been tested and are satisfactory.  ? Do not drink and drive.  ? Talk with family about your driving skills and consider their advice when assessing your driving skills and safety.  ? Do not talk on a cell phone and drive at the same time.    Suicide Risk  The Facts:  ? Suicide rates increase with age and rates are high among those over 23.  ? Older adults who are suicidal are also more likely to be suffering from  ? Physical illnesses and to be divorced or widowed.  ? Older men are more likely to commit suicide than older women.  ? Firearms are used in the majority of suicides committed by older adults.  What can you do?  ? Seek care if you are depressed.  ? Seek social supports such as family, church, [employer], and friends if you are ill, divorced, or living alone.  Remember that depression is a medical illness and not a personal failing or weakness.  There are effective treatments for depression and your medical providers are interested in helping you if you are depressed.    Osteoporosis Prevention  The Facts:  ? Osteoporosis is more common among older adults.  ? Women have a higher rate of osteoporosis than men.  ? The presence of osteoporosis increases the risk of injury from a fall.  ? Smoking is a risk factor for the development of osteoporosis.  What can you do?  ? Participate in a regular exercise program such as walking  ? Be sure that you are taking 1,000 to 1,500 mg of calcium per day, preferably with vitamin D  ? Do not smoke.  ? Do not drink alcohol to excess.  ? Consider having a bone density test to look for osteoporosis.    Falls Prevention  The Facts:  ? More than one-third of adults 40 and older fall each year.  ? Among older Americans, falls are the leading cause of injury deaths and the most common cause of non-fatal injury and hospital admission for trauma.  ? Of those older adults who fall, 20% to 30% suffer moderate to  severe injuries such as hip fractures or head trauma that reduce mobility and independence, and increase the risk of premature death.  ? Falls are the leading cause of traumatic brain injuries.  ? Among older adults, the majority of fractures are caused by falls.  ? White men have the highest fall-related death rates, followed by white women, black men, and then black women.  ? Women sustain about 80% of all hip fractures.  ? Of all fall-related fractures, hip fractures cause the greatest number of deaths and lead to the most severe health problems and reduced quality of life.  ? Up to 25% of community-dwelling older adults who sustain a hip fracture remain institutionalized for at least one year.  Who is at risk?  ? As noted above, adults over the age of 24 have an increased risk, but that risk is even higher among those older than 67.  ? Those with lower body weakness.  ? Those with problems walking and balance.  ? Those who are on four or more medications or any psychoactive medications.  ? Those who drink excessively.  What can you do?  ? Increase lower body strength and balance through regular physical activity and exercise.  ? Review all of your medications with your provider regularly to see if any can be eliminated or the dose reduced.  ? Have your vision checked regularly.  ? Remove tripping hazards in your home such as clutter in the hallways and on the stairs.  ? Remove throw rugs.  ? Use non-slip mats in the bathtub and on shower floors.  ? Have grab bars next to the toilet and in the tub or shower.  ? Have handrails on both sides of the stairway  ? Be sure that the lighting is adequate throughout your home.  ? Do not drink alcohol to excess.  ? If you require the assistance of a cane or walker, use it all the time.  ============================================================

## 2020-09-05 ENCOUNTER — Encounter: Admit: 2020-09-05 | Discharge: 2020-09-05 | Payer: MEDICARE

## 2020-09-05 DIAGNOSIS — I480 Paroxysmal atrial fibrillation: Secondary | ICD-10-CM

## 2020-09-16 ENCOUNTER — Encounter: Admit: 2020-09-16 | Discharge: 2020-09-16 | Payer: MEDICARE

## 2020-09-16 DIAGNOSIS — I2510 Atherosclerotic heart disease of native coronary artery without angina pectoris: Secondary | ICD-10-CM

## 2020-09-16 DIAGNOSIS — E039 Hypothyroidism, unspecified: Secondary | ICD-10-CM

## 2020-09-16 DIAGNOSIS — E782 Mixed hyperlipidemia: Secondary | ICD-10-CM

## 2020-09-16 DIAGNOSIS — I4891 Unspecified atrial fibrillation: Secondary | ICD-10-CM

## 2020-09-16 MED ORDER — LEVOTHYROXINE 50 MCG PO TAB
ORAL_TABLET | Freq: Every day | ORAL | 1 refills | 30.00000 days | Status: AC
Start: 2020-09-16 — End: ?

## 2020-09-16 MED ORDER — OMEPRAZOLE 20 MG PO CPDR
ORAL_CAPSULE | Freq: Every day | 1 refills | Status: AC
Start: 2020-09-16 — End: ?

## 2020-09-16 NOTE — Telephone Encounter
Med Refill Request received.    Patient last seen 07/09/20 with plan to continue PPI.    Follow up appt scheduled for 12/24/20.    3 months and 1 refill e-scribed per protocol.    TSH   Lab Results   Component Value Date/Time    TSH 1.20 07/09/2020 08:10 AM

## 2020-09-23 ENCOUNTER — Encounter: Admit: 2020-09-23 | Discharge: 2020-09-23 | Payer: MEDICARE

## 2020-09-23 DIAGNOSIS — I480 Paroxysmal atrial fibrillation: Secondary | ICD-10-CM

## 2020-10-01 ENCOUNTER — Encounter: Admit: 2020-10-01 | Discharge: 2020-10-01 | Payer: MEDICARE

## 2020-10-04 ENCOUNTER — Encounter: Admit: 2020-10-04 | Discharge: 2020-10-04 | Payer: MEDICARE

## 2020-10-04 DIAGNOSIS — I480 Paroxysmal atrial fibrillation: Secondary | ICD-10-CM

## 2020-10-04 NOTE — Progress Notes
INR in therapeutic range

## 2020-10-15 ENCOUNTER — Encounter: Admit: 2020-10-15 | Discharge: 2020-10-15 | Payer: MEDICARE

## 2020-10-15 DIAGNOSIS — E039 Hypothyroidism, unspecified: Secondary | ICD-10-CM

## 2020-10-15 DIAGNOSIS — I4891 Unspecified atrial fibrillation: Secondary | ICD-10-CM

## 2020-10-15 DIAGNOSIS — E782 Mixed hyperlipidemia: Secondary | ICD-10-CM

## 2020-10-15 DIAGNOSIS — I2510 Atherosclerotic heart disease of native coronary artery without angina pectoris: Secondary | ICD-10-CM

## 2020-10-15 MED ORDER — ATORVASTATIN 40 MG PO TAB
40 mg | ORAL_TABLET | Freq: Every evening | ORAL | 1 refills | Status: AC
Start: 2020-10-15 — End: ?

## 2020-10-15 NOTE — Telephone Encounter
Refill Request received from [facility].    Patient last seen 08/22/20 via telehealth with plan to continue Lipitor 40mg  tablet daily.    Follow up appt scheduled for 12/24/20.    Hepatic Function    Lab Results   Component Value Date/Time    ALBUMIN 4.1 07/09/2020 08:10 AM    TOTPROT 6.4 07/09/2020 08:10 AM    ALKPHOS 38 07/09/2020 08:10 AM    Lab Results   Component Value Date/Time    AST 19 07/09/2020 08:10 AM    ALT 26 07/09/2020 08:10 AM    TOTBILI 1.2 07/09/2020 08:10 AM          3 months and 1 refills e-scribed per protocol.    Shelly Bombard, RN

## 2020-10-25 ENCOUNTER — Encounter: Admit: 2020-10-25 | Discharge: 2020-10-25 | Payer: MEDICARE

## 2020-10-25 DIAGNOSIS — I480 Paroxysmal atrial fibrillation: Secondary | ICD-10-CM

## 2020-10-25 NOTE — Progress Notes
Pt INR in range

## 2020-11-01 ENCOUNTER — Encounter: Admit: 2020-11-01 | Discharge: 2020-11-01 | Payer: MEDICARE

## 2020-11-01 DIAGNOSIS — Z950 Presence of cardiac pacemaker: Secondary | ICD-10-CM

## 2020-11-07 ENCOUNTER — Encounter: Admit: 2020-11-07 | Discharge: 2020-11-07 | Payer: MEDICARE

## 2020-11-07 ENCOUNTER — Ambulatory Visit: Admit: 2020-11-07 | Discharge: 2020-11-07 | Payer: MEDICARE

## 2020-11-07 ENCOUNTER — Ambulatory Visit: Admit: 2020-11-07 | Discharge: 2020-11-08 | Payer: MEDICARE

## 2020-11-07 DIAGNOSIS — M1990 Unspecified osteoarthritis, unspecified site: Secondary | ICD-10-CM

## 2020-11-07 DIAGNOSIS — Z950 Presence of cardiac pacemaker: Secondary | ICD-10-CM

## 2020-11-07 DIAGNOSIS — L578 Other skin changes due to chronic exposure to nonionizing radiation: Secondary | ICD-10-CM

## 2020-11-07 DIAGNOSIS — H9190 Unspecified hearing loss, unspecified ear: Secondary | ICD-10-CM

## 2020-11-07 DIAGNOSIS — B351 Tinea unguium: Secondary | ICD-10-CM

## 2020-11-07 DIAGNOSIS — I4891 Unspecified atrial fibrillation: Secondary | ICD-10-CM

## 2020-11-07 DIAGNOSIS — I10 Essential (primary) hypertension: Secondary | ICD-10-CM

## 2020-11-07 DIAGNOSIS — R5383 Other fatigue: Secondary | ICD-10-CM

## 2020-11-07 DIAGNOSIS — Z7901 Long term (current) use of anticoagulants: Secondary | ICD-10-CM

## 2020-11-07 DIAGNOSIS — I495 Sick sinus syndrome: Secondary | ICD-10-CM

## 2020-11-07 DIAGNOSIS — Z823 Family history of stroke: Secondary | ICD-10-CM

## 2020-11-07 DIAGNOSIS — E7800 Pure hypercholesterolemia, unspecified: Secondary | ICD-10-CM

## 2020-11-07 DIAGNOSIS — I472 Ventricular tachycardia: Secondary | ICD-10-CM

## 2020-11-07 DIAGNOSIS — L570 Actinic keratosis: Secondary | ICD-10-CM

## 2020-11-07 DIAGNOSIS — L304 Erythema intertrigo: Secondary | ICD-10-CM

## 2020-11-07 DIAGNOSIS — R42 Dizziness and giddiness: Secondary | ICD-10-CM

## 2020-11-07 DIAGNOSIS — D489 Neoplasm of uncertain behavior, unspecified: Secondary | ICD-10-CM

## 2020-11-07 DIAGNOSIS — B079 Viral wart, unspecified: Secondary | ICD-10-CM

## 2020-11-07 DIAGNOSIS — R519 Generalized headaches: Secondary | ICD-10-CM

## 2020-11-07 DIAGNOSIS — B353 Tinea pedis: Secondary | ICD-10-CM

## 2020-11-07 DIAGNOSIS — F5221 Male erectile disorder: Secondary | ICD-10-CM

## 2020-11-07 DIAGNOSIS — I480 Paroxysmal atrial fibrillation: Secondary | ICD-10-CM

## 2020-11-07 DIAGNOSIS — L821 Other seborrheic keratosis: Secondary | ICD-10-CM

## 2020-11-07 DIAGNOSIS — E785 Hyperlipidemia, unspecified: Secondary | ICD-10-CM

## 2020-11-07 DIAGNOSIS — E039 Hypothyroidism, unspecified: Secondary | ICD-10-CM

## 2020-11-07 DIAGNOSIS — R001 Bradycardia, unspecified: Secondary | ICD-10-CM

## 2020-11-07 DIAGNOSIS — J302 Other seasonal allergic rhinitis: Secondary | ICD-10-CM

## 2020-11-07 DIAGNOSIS — I2510 Atherosclerotic heart disease of native coronary artery without angina pectoris: Secondary | ICD-10-CM

## 2020-11-07 DIAGNOSIS — K219 Gastro-esophageal reflux disease without esophagitis: Secondary | ICD-10-CM

## 2020-11-07 DIAGNOSIS — M25569 Pain in unspecified knee: Secondary | ICD-10-CM

## 2020-11-07 LAB — CBC AND DIFF
Lab: 0.7 K/UL (ref 0–0.80)
Lab: 16 g/dL (ref 13.5–16.5)
Lab: 4.7 M/UL (ref 4.4–5.5)
Lab: 7.3 K/UL (ref 4.5–11.0)

## 2020-11-07 LAB — COMPREHENSIVE METABOLIC PANEL
Lab: 0.8 mg/dL (ref 0.4–1.24)
Lab: 0.9 mg/dL (ref 0.3–1.2)
Lab: 138 MMOL/L (ref 137–147)
Lab: 21 U/L (ref 7–40)
Lab: 26 MMOL/L (ref 21–30)
Lab: 26 U/L (ref 7–56)
Lab: 4.3 g/dL (ref 3.5–5.0)
Lab: 4.6 MMOL/L (ref 3.5–5.1)
Lab: 6.4 g/dL (ref 6.0–8.0)
Lab: 8 (ref 3–12)
Lab: 93 mg/dL (ref 70–100)
Lab: >60 mL/min (ref >60)
Lab: >60 mL/min (ref >60)

## 2020-11-07 LAB — LIPID PROFILE
Lab: 144 mg/dL (ref <200)
Lab: 59 mg/dL — AB (ref >40)
Lab: 76 mg/dL (ref <100)
Lab: 85 mg/dL — ABNORMAL HIGH (ref <150)

## 2020-11-07 LAB — PROTIME INR (PT): Lab: 2 MMOL/L — ABNORMAL HIGH (ref 0.8–1.2)

## 2020-11-07 LAB — TSH WITH FREE T4 REFLEX: Lab: 1.6 uU/mL — ABNORMAL LOW (ref 0.35–5.00)

## 2020-11-07 LAB — VITAMIN B12: Lab: 232 pg/mL (ref 180–914)

## 2020-11-07 NOTE — Progress Notes
Date of Service: 11/07/2020    Jonathan Humphrey is a 86 y.o. male.       HPI    Jonathan Humphrey comes for followup.  I saw him 6 months ago.  He is a very pleasant 86 year old gentleman.  He has a history of atrial fibrillation, previous AFib ablation.  He has had a previous stroke.  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  He has had resection of his left atrial appendage.  He had angioplasty back in 1995.  He has a permanent pacemaker for chronotropic incompetence.  He has had hypertension, hyperlipidemia, hypothyroidism, and gastroesophageal reflux.  He has not had any significant obstructive coronary artery disease.  He was hospitalized last year with VT.  He has been following up with Dr. [name].  She has not felt that he needed an upgrade of his device.  He is approaching ERI for his generator.  He has been having troubles with his balance.  He denies any recent falls.  There has been no fever, chills, or sweats.  He had been on Proscar, but I understand recently stopped that.  I have asked him to follow up with Dr. Enis Slipper in that regard.  He is not having chest pain or shortness of breath.  There has been no presyncope, syncope, TIA, stroke, or claudication.  He denies fever, chills, and sweats.  There has been no COVID infection.    ([id number])               Vitals:    11/07/20 1035   BP: 126/84   BP Source: Arm, Left Upper   Patient Position: Sitting   Pulse: 68   SpO2: 95%   Weight: 89.9 kg (198 lb 3.2 oz)   Height: 1.702 m (5' 7)   PainSc: Zero     Body mass index is 31.04 kg/m?Marland Kitchen     Past Medical History  Patient Active Problem List    Diagnosis Date Noted   ? Rash 11/14/2018   ? Head trauma, initial encounter 10/20/2018   ? Carpal tunnel syndrome of left wrist 07/05/2018   ? PTSD (post-traumatic stress disorder) 07/05/2018   ? Benign prostatic hyperplasia with lower urinary tract symptoms 06/03/2017   ? Shoulder arthritis 04/05/2017   ? Trigger middle finger of right hand 12/09/2016   ? Paroxysmal ventricular tachycardia (HCC) 08/13/2016     06/07/2019 - ECHO:  Normal left ventricular size and systolic function.  LVEF 65%.  There is paradoxical septal wall motion abnormality.  Normal right ventricular size and systolic function.  Pacer lead was visualized in the right ventricle.  No hemodynamically significant valve disease.  There is moderate mitral annular calcification.  No pericardial effusion.  Mildly dilated sinus of Valsalva.  02/11/2020 - ECHO:  Normal left ventricular size. Prominent basal septum. No outflow tract obstruction. Normal systolic function. Estimated EF ~65%.  Abnormal septal wall motion consistent with right ventricular pacing.  Unable to assess diastolic function.  Normal right ventricular size and systolic function.  The left atrium is mildly dilated.  Normal right atrial size.  Device leads in right-sided chambers.  Moderate calcification of the mitral valve annulus with extension into the leaflets.  Very mild stenosis.  Mean gradient 3-4 mmHg at a heart rate of 78 bpm.  Mild regurgitation.  Aortic valve sclerosis without stenosis.  Trivial regurgitation.  Mild to moderate tricuspid valve regurgitation.  No pericardial effusion.  Estimated pulmonary artery systolic pressure  26 mmHg.   The aortic root is dilated, measuring 3.8 cm at the sinus of Valsalva.     ? Right inguinal hernia 12/06/2015   ? Cardiac pacemaker in situ 10/18/2015     ? 10/21/15 [employer] dual-chamber PPM implantation - Dr. Naoma Diener     ? 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 BMI 31.0-31.9,adult 08/10/2015   ? Primary osteoarthritis of left knee 06/04/2015   ? Visit for preventive health examination 09/05/2012   ? AV block, 2nd degree 07/14/2010   ? Leg cramps, sleep related 04/10/2010   ? Aortic valve mass 03/28/2010     05/14/2010: Pt. underwent resection of intracardiac mass from right coronary cusp of aortic valve and modified radiofrequency maze with bilateral pulmonary vein isolation and resection of left atrial appendage by Dr. Helen Hashimoto.     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  Aortic valve function was preserved.  ?     ? 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 Micheline Rough: 10-20% prox-CFX, 20-30% prox-RCA  06/08/2019 - CCTA:  Extensive coronary artery calcifications making the accurate estimation of stenosis difficult.  Normal coronary artery system origins and course. Right dominant system.  The left anterior descending artery with extensive coronary artery calcification in the proximal to mid segments severe stenosis cannot be excluded. The left anterior descending artery is patent in the mid to distal segments and not well-visualized apically. Study will be assessed by FFR to evaluate hemodynamically.  The left circumflex artery has mild to moderate calcific atherosclerotic changes without obvious stenosis.  The right coronary artery has mild to moderate calcific atherosclerotic changes with mild stenosis proximally. The PDA is not visualized distally.      ? HLD (hyperlipidemia) 04/11/2009   ? Essential hypertension 04/11/2009   ? GERD (gastroesophageal reflux disease) 04/11/2009   ? Sensorineural hearing loss 04/11/2009   ? Erectile dysfunction of non-organic origin 04/11/2009   ? Hypothyroid 04/11/2009   ? PAF (paroxysmal atrial fibrillation) (HCC) 04/11/2009   ? Chronic anticoagulation, on warfarin 04/11/2009         Review of Systems   Neurological: Positive for loss of balance.   All other systems reviewed and are negative.  Review of systems as documented in the database.    ([id number])        Physical Exam  On examination, he is in no distress.  He is 5 feet 7.  Weight is 198.  BMI is 31.  Blood pressure 126/84.  Pulse is regular at 70 beats per minute.  Rhythm is paced.  Saturation is 95%.  There is no cyanosis.  Venous pressure is normal.  There is no edema.  Lungs are clear.  There is no wheeze or rhonchi.  PMI is not felt.  Heart sounds reveal a 1/6 systolic murmur along the left sternal border.  There are no diastolic murmurs.  There is no hepatomegaly.  There are no abdominal bruits.  Bowel sounds are normal.  There is no icterus.  There is no focal neurologic deficit.  Distal pulses are 2+ bilaterally.  There are no carotid bruits.  He is alert and oriented times 3.  His mood, judgment, and affect are normal.    ([id number])        Cardiovascular Studies  EKG shows an AV-paced rhythm with premature ventricular contractions.    ([id number])  Cardiovascular Health Factors  Vitals BP Readings from Last 3 Encounters:   11/07/20 126/84   07/09/20 99/63   05/07/20 124/84     Wt Readings from Last 3 Encounters:   11/07/20 89.9 kg (198 lb 3.2 oz)   07/09/20 88.5 kg (195 lb)   05/21/20 87.1 kg (192 lb)     BMI Readings from Last 3 Encounters:   11/07/20 31.04 kg/m?   07/09/20 29.86 kg/m?   05/21/20 29.41 kg/m?      Smoking Social History     Tobacco Use   Smoking Status Never Smoker   Smokeless Tobacco Never Used      Lipid Profile Cholesterol   Date Value Ref Range Status   07/09/2020 120 <200 MG/DL Final     HDL   Date Value Ref Range Status   07/09/2020 48 >40 MG/DL Final     LDL   Date Value Ref Range Status   07/09/2020 59 <100 mg/dL Final     Triglycerides   Date Value Ref Range Status   07/09/2020 69 <150 MG/DL Final      Blood Sugar Hemoglobin A1C   Date Value Ref Range Status   07/09/2020 5.6 4.0 - 6.0 % Final     Comment:     The ADA recommends that most patients with type 1 and type 2 diabetes maintain   an A1c level <7%.       Glucose   Date Value Ref Range Status   07/09/2020 108 (H) 70 - 100 MG/DL Final   62/69/4854 627 (H) 70 - 100 MG/DL Final   03/50/0938 89  Final   01/06/2006 94 70 - 110 MG/DL Final   18/29/9371 696 (H) 70 - 110 MG/DL Final   78/93/8101 96 70 - 110 MG/DL Final     Glucose, POC   Date Value Ref Range Status   05/18/2010 89 70 - 100 MG/DL Final   75/09/2584 277 (H) 70 - 100 MG/DL Final   82/42/3536 144 (H) 70 - 100 MG/DL Final          Problems Addressed Today  Encounter Diagnoses   Name Primary?   ? 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 Cardiac pacemaker in situ    ? Paroxysmal ventricular tachycardia (HCC)    ? Pure hypercholesterolemia    ? Essential hypertension    ? Coronary artery disease, non-occlusive        Assessment and Plan    Mr. Rinks has some increasing balance problems.  He has a history of previous stroke.  I would like to get a head CT scan and make sure that there are not any new strokes or subdural hematomas.  We should rule out normal-pressure hydrocephalus.  He needs a followup cardiac echo.  I would like him to see the neurology team for their evaluation.  I would like to check lab work today.  He will continue to follow with Dr. Enis Slipper.  I would like to check his B12 levels.  Further recommendations are pending these results.  If I can be of further assistance, please do not hesitate to let me know.    ([id number])               Current Medications (including today's revisions)  ? aspirin EC 81 mg tablet Take 1 tablet by mouth daily. Take with food.   ? atorvastatin (LIPITOR) 40 mg tablet Take one tablet by mouth at bedtime daily.   ?  cholecalciferol (VITAMIN D-3) 50 mcg (2,000 unit) tablet Take 2,000 Units by mouth daily.   ? ezetimibe (ZETIA) 10 mg tablet TAKE (1) TABLET BY MOUTH ONCE DAILY   ? finasteride (PROSCAR) 5 mg tablet Take one tablet by mouth daily.   ? levothyroxine (SYNTHROID) 50 mcg tablet TAKE (1) TABLET BY MOUTH ONCE DAILY   ? lisinopriL (ZESTRIL) 20 mg tablet TAKE (1) TABLET BY MOUTH ONCE DAILY   ? metoprolol XL (TOPROL XL) 50 mg extended release tablet Take one tablet by mouth at bedtime daily.   ? OMEGA-3 FATTY ACIDS/FISH OIL (FISH OIL-OMEGA-3 FATTY ACIDS PO) Take 3 tablets by mouth daily.   ? omeprazole DR (PRILOSEC) 20 mg capsule TAKE (1) CAPSULE BY MOUTH ONCE DAILY   ? tamsulosin (FLOMAX) 0.4 mg capsule TAKE 2 CAPSULES 30 MINUTES FOLLOWING THE SAME MEAL DAILY. DO NOT CRUSH, CHEW OR OPEN CAPSULES.   ? warfarin (COUMADIN) 4 mg tablet TAKE 1 TABLET DAILY OR AS INSTRUCTED BY DR. Vivianne Spence

## 2020-11-08 ENCOUNTER — Encounter: Admit: 2020-11-08 | Discharge: 2020-11-08 | Payer: MEDICARE

## 2020-11-08 DIAGNOSIS — I480 Paroxysmal atrial fibrillation: Secondary | ICD-10-CM

## 2020-11-08 DIAGNOSIS — I2510 Atherosclerotic heart disease of native coronary artery without angina pectoris: Secondary | ICD-10-CM

## 2020-11-08 DIAGNOSIS — R2689 Other abnormalities of gait and mobility: Secondary | ICD-10-CM

## 2020-11-08 DIAGNOSIS — I10 Essential (primary) hypertension: Secondary | ICD-10-CM

## 2020-11-08 NOTE — Progress Notes
PT INR WNL for pt. Pt is to only be called if out of range. Will continue to monitor.

## 2020-11-20 ENCOUNTER — Encounter: Admit: 2020-11-20 | Discharge: 2020-11-20 | Payer: MEDICARE

## 2020-11-20 NOTE — Progress Notes
Called to discuss patient's INR, but patient outside walking the dog. Pt's spouse asked to take the message and relay to patient. Discussed the plan to increase tomorrow's dose to 4 mg and recheck next Wednesday, 11/27/20 since pt's INR only 1.8 today. Pt's spouse verbalized understanding and accurately restated the plan to me.

## 2020-11-21 ENCOUNTER — Encounter: Admit: 2020-11-21 | Discharge: 2020-11-21 | Payer: MEDICARE

## 2020-11-21 DIAGNOSIS — I480 Paroxysmal atrial fibrillation: Secondary | ICD-10-CM

## 2020-11-21 DIAGNOSIS — I10 Essential (primary) hypertension: Secondary | ICD-10-CM

## 2020-11-21 MED ORDER — CYANOCOBALAMIN (VITAMIN B-12) 1,000 MCG PO TAB
1000 ug | ORAL_TABLET | Freq: Every day | ORAL | 3 refills | 29.00000 days | Status: AC
Start: 2020-11-21 — End: ?

## 2020-11-21 MED ORDER — CYANOCOBALAMIN (VITAMIN B-12) 1,000 MCG/ML IJ SOLN
1 mL | INTRAMUSCULAR | 0 refills | 29.00000 days | Status: AC
Start: 2020-11-21 — End: ?

## 2020-11-27 ENCOUNTER — Encounter: Admit: 2020-11-27 | Discharge: 2020-11-27 | Payer: MEDICARE

## 2020-11-27 NOTE — Progress Notes
11/27/2020 4:56 PM - INR 2.9 - No need to call patient as he is in range. INR check in 1 week. Hdent RN

## 2020-12-04 ENCOUNTER — Encounter: Admit: 2020-12-04 | Discharge: 2020-12-04 | Payer: MEDICARE

## 2020-12-04 ENCOUNTER — Ambulatory Visit: Admit: 2020-12-04 | Discharge: 2020-12-04 | Payer: MEDICARE

## 2020-12-04 DIAGNOSIS — R2689 Other abnormalities of gait and mobility: Secondary | ICD-10-CM

## 2020-12-04 DIAGNOSIS — E7800 Pure hypercholesterolemia, unspecified: Secondary | ICD-10-CM

## 2020-12-04 DIAGNOSIS — I2510 Atherosclerotic heart disease of native coronary artery without angina pectoris: Secondary | ICD-10-CM

## 2020-12-04 DIAGNOSIS — I10 Essential (primary) hypertension: Secondary | ICD-10-CM

## 2020-12-04 MED ORDER — PERFLUTREN LIPID MICROSPHERES 1.1 MG/ML IV SUSP
1-20 mL | Freq: Once | INTRAVENOUS | 0 refills | Status: CP | PRN
Start: 2020-12-04 — End: ?
  Administered 2020-12-04: 17:00:00 1.5 mL via INTRAVENOUS

## 2020-12-05 ENCOUNTER — Encounter: Admit: 2020-12-05 | Discharge: 2020-12-05 | Payer: MEDICARE

## 2020-12-05 NOTE — Progress Notes
WNL- Did not call pt per his request. Expect repeat INR in 2 weeks.

## 2020-12-09 ENCOUNTER — Encounter: Admit: 2020-12-09 | Discharge: 2020-12-09 | Payer: MEDICARE

## 2020-12-11 ENCOUNTER — Encounter: Admit: 2020-12-11 | Discharge: 2020-12-11 | Payer: MEDICARE

## 2020-12-12 NOTE — Progress Notes
Pt hospitalized since last office visit: No    Health Maintenance Due   Topic Date Due    INFLUENZA VACCINE  07/21/2020    PHYSICAL (COMPREHENSIVE) EXAM  09/11/2020       There are no diagnoses linked to this encounter.    Labs not done:      Lab Frequency Next Occurrence   REQUEST FOR CARDIOLOGY APPOINTMENT Once 08/04/2016   REQUEST FOR CARDIOLOGY APPOINTMENT Once 12/09/2017   REQUEST FOR CARDIOLOGY APPOINTMENT Once 07/07/2019   REQUEST FOR CARDIOLOGY APPOINTMENT Once 03/11/2020   CBC AND DIFF Once 49/70/2637   BASIC METABOLIC PANEL Once 85/88/5027   HEMOGLOBIN A1C Once 09/09/2020   LIPID PROFILE Once 09/09/2020   LIVER FUNCTION PANEL Once 09/09/2020   TSH WITH FREE T4 REFLEX Once 09/09/2020   DEVICE EVALUATION - PPM Once 02/06/2021   REQUEST FOR CARDIOLOGY APPOINTMENT Once 02/06/2021   VITAMIN B12 Once 02/19/2021   PROTIME INR (PT) Twice a Week Auto 12/13/2020, 12/17/2020, 12/20/2020, 12/24/2020, 12/27/2020, 12/31/2020, 01/03/2021, 01/07/2021, 01/10/2021, 01/14/2021, 01/17/2021, 01/21/2021   DEVICE EVALUATION - REMOTE PPM         MyChart message sent to patient on 12/22/21Angie Glennon Mac, RN     Notes to provider:

## 2020-12-23 ENCOUNTER — Encounter: Admit: 2020-12-23 | Discharge: 2020-12-23 | Payer: MEDICARE

## 2020-12-23 DIAGNOSIS — I480 Paroxysmal atrial fibrillation: Secondary | ICD-10-CM

## 2020-12-23 DIAGNOSIS — K219 Gastro-esophageal reflux disease without esophagitis: Secondary | ICD-10-CM

## 2020-12-23 DIAGNOSIS — I10 Essential (primary) hypertension: Secondary | ICD-10-CM

## 2020-12-23 DIAGNOSIS — R7301 Impaired fasting glucose: Secondary | ICD-10-CM

## 2020-12-23 NOTE — Progress Notes
WNL/AR

## 2020-12-24 ENCOUNTER — Encounter: Admit: 2020-12-24 | Discharge: 2020-12-24 | Payer: MEDICARE

## 2020-12-24 ENCOUNTER — Ambulatory Visit: Admit: 2020-12-24 | Discharge: 2020-12-25 | Payer: MEDICARE

## 2020-12-24 DIAGNOSIS — E039 Hypothyroidism, unspecified: Secondary | ICD-10-CM

## 2020-12-24 DIAGNOSIS — L304 Erythema intertrigo: Secondary | ICD-10-CM

## 2020-12-24 DIAGNOSIS — L821 Other seborrheic keratosis: Secondary | ICD-10-CM

## 2020-12-24 DIAGNOSIS — F5221 Male erectile disorder: Secondary | ICD-10-CM

## 2020-12-24 DIAGNOSIS — L570 Actinic keratosis: Secondary | ICD-10-CM

## 2020-12-24 DIAGNOSIS — B351 Tinea unguium: Secondary | ICD-10-CM

## 2020-12-24 DIAGNOSIS — Z950 Presence of cardiac pacemaker: Secondary | ICD-10-CM

## 2020-12-24 DIAGNOSIS — N401 Enlarged prostate with lower urinary tract symptoms: Secondary | ICD-10-CM

## 2020-12-24 DIAGNOSIS — K219 Gastro-esophageal reflux disease without esophagitis: Secondary | ICD-10-CM

## 2020-12-24 DIAGNOSIS — E7800 Pure hypercholesterolemia, unspecified: Secondary | ICD-10-CM

## 2020-12-24 DIAGNOSIS — R519 Generalized headaches: Secondary | ICD-10-CM

## 2020-12-24 DIAGNOSIS — Z7901 Long term (current) use of anticoagulants: Secondary | ICD-10-CM

## 2020-12-24 DIAGNOSIS — B353 Tinea pedis: Secondary | ICD-10-CM

## 2020-12-24 DIAGNOSIS — Z823 Family history of stroke: Secondary | ICD-10-CM

## 2020-12-24 DIAGNOSIS — B079 Viral wart, unspecified: Secondary | ICD-10-CM

## 2020-12-24 DIAGNOSIS — J302 Other seasonal allergic rhinitis: Secondary | ICD-10-CM

## 2020-12-24 DIAGNOSIS — D489 Neoplasm of uncertain behavior, unspecified: Secondary | ICD-10-CM

## 2020-12-24 DIAGNOSIS — M25569 Pain in unspecified knee: Secondary | ICD-10-CM

## 2020-12-24 DIAGNOSIS — I472 Ventricular tachycardia: Secondary | ICD-10-CM

## 2020-12-24 DIAGNOSIS — I10 Essential (primary) hypertension: Secondary | ICD-10-CM

## 2020-12-24 DIAGNOSIS — I495 Sick sinus syndrome: Secondary | ICD-10-CM

## 2020-12-24 DIAGNOSIS — I480 Paroxysmal atrial fibrillation: Secondary | ICD-10-CM

## 2020-12-24 DIAGNOSIS — R7301 Impaired fasting glucose: Secondary | ICD-10-CM

## 2020-12-24 DIAGNOSIS — G3189 Other specified degenerative diseases of nervous system: Secondary | ICD-10-CM

## 2020-12-24 DIAGNOSIS — M1990 Unspecified osteoarthritis, unspecified site: Secondary | ICD-10-CM

## 2020-12-24 DIAGNOSIS — R5383 Other fatigue: Secondary | ICD-10-CM

## 2020-12-24 DIAGNOSIS — H9190 Unspecified hearing loss, unspecified ear: Secondary | ICD-10-CM

## 2020-12-24 DIAGNOSIS — I4891 Unspecified atrial fibrillation: Secondary | ICD-10-CM

## 2020-12-24 DIAGNOSIS — E785 Hyperlipidemia, unspecified: Secondary | ICD-10-CM

## 2020-12-24 DIAGNOSIS — L578 Other skin changes due to chronic exposure to nonionizing radiation: Secondary | ICD-10-CM

## 2020-12-24 DIAGNOSIS — R42 Dizziness and giddiness: Secondary | ICD-10-CM

## 2020-12-24 DIAGNOSIS — R001 Bradycardia, unspecified: Secondary | ICD-10-CM
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
## 2020-12-24 NOTE — Patient Instructions
Get fasting labs few days before return to clinic in 6 months

## 2020-12-24 NOTE — Progress Notes
Administered 0.7mL influenza quadrivalent vaccine into left deltoid.  Patient signed consent form and was given VIS sheet. Patient tolerated injection well and had no complaints.

## 2020-12-25 DIAGNOSIS — E039 Hypothyroidism, unspecified: Secondary | ICD-10-CM

## 2020-12-25 DIAGNOSIS — K219 Gastro-esophageal reflux disease without esophagitis: Secondary | ICD-10-CM

## 2020-12-25 DIAGNOSIS — I480 Paroxysmal atrial fibrillation: Secondary | ICD-10-CM

## 2020-12-25 DIAGNOSIS — I10 Essential (primary) hypertension: Secondary | ICD-10-CM

## 2020-12-27 ENCOUNTER — Encounter: Admit: 2020-12-27 | Discharge: 2020-12-27 | Payer: MEDICARE

## 2020-12-27 NOTE — Telephone Encounter
RN called pt re: appt to remain on 01/02/21 at 11 am. Pt agreed. RN informed pt, my chart message will be sent with appt info. Pt agreed to view this.

## 2020-12-27 NOTE — Telephone Encounter
Phone call made to pt regarding appointment with Dr Terence Lux on (01/02/2021) that needs to be changed. Pt informed that he will receive a call to reschedule.

## 2021-01-01 ENCOUNTER — Encounter: Admit: 2021-01-01 | Discharge: 2021-01-01 | Payer: MEDICARE

## 2021-01-01 NOTE — Telephone Encounter
-----   Message from Louis Meckel, LPN sent at 3/78/5885 10:50 AM CST -----  Regarding: [place]  VM on triage line from patient.  Michela Pitcher that he had my chart message that storage failed.  What does this mean?  Call him at [phone].

## 2021-01-01 NOTE — Telephone Encounter
I tried to call but no answer. No machine. I will try back later.

## 2021-01-01 NOTE — Telephone Encounter
I called back and notified wife of issue with mychart device reports and let them know they don't need to be worried about it.

## 2021-01-02 ENCOUNTER — Encounter: Admit: 2021-01-02 | Discharge: 2021-01-02 | Payer: PRIVATE HEALTH INSURANCE

## 2021-01-02 ENCOUNTER — Ambulatory Visit: Admit: 2021-01-02 | Discharge: 2021-01-02 | Payer: MEDICARE

## 2021-01-02 DIAGNOSIS — B353 Tinea pedis: Secondary | ICD-10-CM

## 2021-01-02 DIAGNOSIS — K219 Gastro-esophageal reflux disease without esophagitis: Secondary | ICD-10-CM

## 2021-01-02 DIAGNOSIS — Z823 Family history of stroke: Secondary | ICD-10-CM

## 2021-01-02 DIAGNOSIS — R5383 Other fatigue: Secondary | ICD-10-CM

## 2021-01-02 DIAGNOSIS — L578 Other skin changes due to chronic exposure to nonionizing radiation: Secondary | ICD-10-CM

## 2021-01-02 DIAGNOSIS — R42 Dizziness and giddiness: Secondary | ICD-10-CM

## 2021-01-02 DIAGNOSIS — E785 Hyperlipidemia, unspecified: Secondary | ICD-10-CM

## 2021-01-02 DIAGNOSIS — H9190 Unspecified hearing loss, unspecified ear: Secondary | ICD-10-CM

## 2021-01-02 DIAGNOSIS — D489 Neoplasm of uncertain behavior, unspecified: Secondary | ICD-10-CM

## 2021-01-02 DIAGNOSIS — Z7901 Long term (current) use of anticoagulants: Secondary | ICD-10-CM

## 2021-01-02 DIAGNOSIS — R519 Generalized headaches: Secondary | ICD-10-CM

## 2021-01-02 DIAGNOSIS — L304 Erythema intertrigo: Secondary | ICD-10-CM

## 2021-01-02 DIAGNOSIS — I495 Sick sinus syndrome: Secondary | ICD-10-CM

## 2021-01-02 DIAGNOSIS — R001 Bradycardia, unspecified: Secondary | ICD-10-CM

## 2021-01-02 DIAGNOSIS — L821 Other seborrheic keratosis: Secondary | ICD-10-CM

## 2021-01-02 DIAGNOSIS — F5221 Male erectile disorder: Secondary | ICD-10-CM

## 2021-01-02 DIAGNOSIS — B351 Tinea unguium: Secondary | ICD-10-CM

## 2021-01-02 DIAGNOSIS — L570 Actinic keratosis: Secondary | ICD-10-CM

## 2021-01-02 DIAGNOSIS — R2689 Other abnormalities of gait and mobility: Secondary | ICD-10-CM

## 2021-01-02 DIAGNOSIS — M1990 Unspecified osteoarthritis, unspecified site: Secondary | ICD-10-CM

## 2021-01-02 DIAGNOSIS — I480 Paroxysmal atrial fibrillation: Secondary | ICD-10-CM

## 2021-01-02 DIAGNOSIS — E039 Hypothyroidism, unspecified: Secondary | ICD-10-CM

## 2021-01-02 DIAGNOSIS — R292 Abnormal reflex: Secondary | ICD-10-CM

## 2021-01-02 DIAGNOSIS — I10 Essential (primary) hypertension: Secondary | ICD-10-CM

## 2021-01-02 DIAGNOSIS — B079 Viral wart, unspecified: Secondary | ICD-10-CM

## 2021-01-02 DIAGNOSIS — M25569 Pain in unspecified knee: Secondary | ICD-10-CM

## 2021-01-02 DIAGNOSIS — J302 Other seasonal allergic rhinitis: Secondary | ICD-10-CM

## 2021-01-02 DIAGNOSIS — I4891 Unspecified atrial fibrillation: Secondary | ICD-10-CM

## 2021-01-02 MED ORDER — DIAZEPAM 5 MG PO TAB
5 mg | ORAL_TABLET | ORAL | 0 refills | 7.00000 days | Status: AC | PRN
Start: 2021-01-02 — End: ?

## 2021-01-02 NOTE — Progress Notes
Date of Service: 01/02/2021    Subjective:             Jonathan Humphrey is a 87 y.o. male.    History of Present Illness  Jonathan Humphrey comes to Neurology clinic today for evaluation of his balance issues and his abnormal CT scan. He had a near fall in December. Dr Vivianne Spence ordered a ct scan of his head that showed some small vessel disease but nothing acute. He has had increasing issues with his balance where it feels like he is going to fall over. This has steady been getting worse since last summer. He did have a b12 level that was marginally low at 232pg/mL last November.   He has not fallen completely to the ground, he does not feel lightheaded or dizzy. He says he can get back up right away pretty easily.   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 He sees Dr Vivianne Spence and Dr Ladean Raya for this     Medical History:   Diagnosis Date   ? AK (actinic keratosis)     scalp   ? Arthritis    ? Atrial fibrillation (HCC) 01/16/2009   ? Chronic anticoagulation 01/16/2009   ? Coronary atherosclerosis 09/01/2006    Coronary artery disease   ? Cyst     right upper back   ? Dizziness 01/19/2007   ? 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 Essential hypertension 04/11/2009   ? Family history of cerebrovascular accident (CVA) 01/19/2007   ? Fatigue 07/21/2007   ? Generalized headaches    ? GERD (gastroesophageal reflux disease) 12/30/2006   ? Hearing loss 01/19/2007   ? HLD (hyperlipidemia) 04/11/2009   ? Hyperlipidemia 09/01/2006   ? Hypertension, essential 09/01/2006   ? Hypothyroidism 11/30/2007   ? Intertrigo    ? Knee pain 12/30/2006   ? Neoplasm of uncertain behavior    ? Onychomycosis    ? PAF (paroxysmal atrial fibrillation) (HCC) 04/11/2009   ? Photoaging of skin    ? Seasonal allergic reaction    ? Sinus bradycardia    ? SK (seborrheic keratosis)     face, scalp, right ear   ? SSS (sick sinus syndrome) (HCC)    ? Tinea pedis    ? Verruca vulgaris      Surgical History:   Procedure Laterality Date   ? LEFT HEART CATHETERIZATION  05/1994    no stents   ? CORONARY ANGIOPLASTY  05/1994    [facility], no stents   ? SKIN BIOPSY  09/24/2006    shave biopsy   ? CARDIOVERSION  03/11/2009   ? 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 KNEE REPLACEMENT Right 10/09/14   ? Insert Permanent Pacemaker and Atrial and Ventricular Leads Left 10/21/2015    Performed by Smith Robert, MD at [facility] EP LAB   ? Fluoroscopy N/A 10/21/2015    Performed by Smith Robert, MD at [facility] EP LAB   ? REPAIR HERNIA UMBILICAL N/A 01/13/2016    Performed by Simonne Martinet, MD at [facility] OR   ? REPAIR HERNIA INGUINAL Bilateral 01/13/2016    Performed by Simonne Martinet, MD at [facility] OR   ? SKIN LESION REMOVAL  2018    3 spots on face by outside facility   ? ANGIOGRAPHY  CORONARY ARTERY N/A 05/10/2017    Performed by Greig Castilla, MD at [facility] CATH LAB   ? POSSIBLE PERCUTANEOUS CORONARY STENT PLACEMENT WITH ANGIOPLASTY N/A 05/10/2017    Performed by Greig Castilla, MD at [facility] CATH LAB   ? ANGIOGRAPHY CORONARY ARTERY WITH LEFT HEART CATHETERIZATION N/A 07/05/2019    Performed by Harley Alto, MD at [facility] CATH LAB   ? POSSIBLE PERCUTANEOUS CORONARY STENT PLACEMENT WITH ANGIOPLASTY N/A 07/05/2019    Performed by Harley Alto, MD at [facility] CATH LAB     Social History     Tobacco Use   ? Smoking status: Never Smoker   ? Smokeless tobacco: Never Used   Vaping Use   ? Vaping Use: Never used   Substance Use Topics   ? Alcohol use: Yes     Alcohol/week: 7.0 standard drinks     Types: 7 Shots of liquor per week     Comment: 2-3 ounces daily   ? Drug use: No      family history includes Cancer in his father; High Cholesterol in his mother; Hypertension in his mother; Stroke in his father.   No Known Allergies        Review of Systems        Objective:         ? aspirin EC 81 mg tablet Take 1 tablet by mouth daily. Take with food.   ? atorvastatin (LIPITOR) 40 mg tablet Take one tablet by mouth at bedtime daily.   ? cholecalciferol (VITAMIN D-3) 50 mcg (2,000 unit) tablet Take 2,000 Units by mouth daily.   ? cyanocobalamin (VITAMIN B-12) 1,000 mcg tablet Take one tablet by mouth daily. Start this the day after taking your one time Vitamin B12 injection.   ? ezetimibe (ZETIA) 10 mg tablet TAKE (1) TABLET BY MOUTH ONCE DAILY   ? finasteride (PROSCAR) 5 mg tablet Take one tablet by mouth daily.   ? levothyroxine (SYNTHROID) 50 mcg tablet TAKE (1) TABLET BY MOUTH ONCE DAILY   ? lisinopriL (ZESTRIL) 20 mg tablet TAKE (1) TABLET BY MOUTH ONCE DAILY   ? metoprolol XL (TOPROL XL) 50 mg extended release tablet Take one tablet by mouth at bedtime daily.   ? OMEGA-3 FATTY ACIDS/FISH OIL (FISH OIL-OMEGA-3 FATTY ACIDS PO) Take 3 tablets by mouth daily.   ? tamsulosin (FLOMAX) 0.4 mg capsule TAKE 2 CAPSULES 30 MINUTES FOLLOWING THE SAME MEAL DAILY. DO NOT CRUSH, CHEW OR OPEN CAPSULES.   ? warfarin (COUMADIN) 4 mg tablet TAKE 1 TABLET DAILY OR AS INSTRUCTED BY DR. Vivianne Spence     Vitals:    01/02/21 1102   BP: 127/75   BP Source: Arm, Left Upper   Patient Position: Sitting   Pulse: 72   Weight: 88.5 kg (195 lb)   Height: 172.1 cm (67.76)   PainSc: Zero     Body mass index is 29.86 kg/m?Marland Kitchen     Physical Exam          GENERAL EXAM:   Patient is a well developed well nourished male,  sitting in bed, no acute distress   HEENT: Atraumatic, normoceplalic. Pupils are equally round and reactive, full extra-ocular movements,    NECK: Neck is supple, no JVD, Bruits or lymphadenopathy  CV: Regular rhythm, S1 + S2 + no murmurs / gallops / rubs   LUNGS: Clear to auscultations bilaterally   ABDOMEN: Non-tender, non-distended, No hepato-splenomegaly, Normal bowel sounds and movement.   EXTREMITIES: No clubbing, cyanosis or edema. Dorsalis pedis  pulses palpable.   SKIN: No bruising or pigmentations noted     NEUROLOGICAL EXAMINATION   Patient is alert and oriented to time, person and place.   Able to perform multi-step commands and has a good fund of knowledge.   No apraxias or agnosias noted.   Speech is normal with good comprehension, repetitions and naming.     CRANIAL NERVES   CN II: Pupils are 4mm reactive to 3mm OU, visual fields are full to confrontation. Funduscopy reveals sharp disc margins and spontaneous venous pulsations noted.   CN III, IV and VI: Full extra-ocular muscle movements. No nystagmus or ptosis. Normal pursuits and saccadic eye movements.   CN V: Normal facial sensation and normal corneal reflex bilaterally.   CN VII: Symmetrical face.   CN VIII: Normal hearing to gross testing to finger rub bilaterally.   CN IX ? XII: Normal gag, midline tongue and good strength of sternocleidomastoid and trapezius muscles bilaterally.   MOTOR: 5/5 strength of all 4 extremities, normal bulk and tone and no adventitious movements.   SENSORY: Normal sensations light touch, pin prick, vibrations and proprioception to all   four extremities.   DEEP TENDON REFLEXES: 4+ in patellae with crossed adductor reflexes, 3-4+ in br, biceps and pectoral   CEREBELLAR: Normal finger-nose-finger and rapid alternating movements. No dysmetrias or dysdiadochokinesias noted. Normal heel-shin testing.         Assessment and Plan:  87 yo M with feeling of off balance and near-falls   I reviewed his CT scan at length with him- he has a near exquisite brain for his age. I absolutely do not think his history of stroke or cerebrovascular disease is the cause for his symptoms.   He did display signs of hyper-reflexia,almost to the point of pathological reflexes all the way to his pectoral reflexes. Thus I think we should obtain an MRI of his cervical spine. He did relate that he has some issues with claustrophobia and anxiety with MRI. I prescribed two tablets of valium for him to take one prior to his MRI (and another if needed). He will also need to have his PPM cleared- it was placed in 2016 so I imagine it is MRI compatible. If he is unable to tolerate the MRI or cannot we can also try to get a ct of his spine and a myelogram.   Low b12 could also be a cause of his symptoms. It was checked in November and was on the low side of normal. I would like to check it again and add methylmalonic acid (MMA) and homocysteine levels as well. If MRI is unrevealing we could consider trying some B12 pills to see if it helps.

## 2021-01-03 ENCOUNTER — Encounter: Admit: 2021-01-03 | Discharge: 2021-01-03 | Payer: PRIVATE HEALTH INSURANCE

## 2021-01-07 ENCOUNTER — Encounter: Admit: 2021-01-07 | Discharge: 2021-01-07 | Payer: PRIVATE HEALTH INSURANCE

## 2021-01-07 NOTE — Progress Notes
INR in range so no call placed.

## 2021-01-14 ENCOUNTER — Encounter: Admit: 2021-01-14 | Discharge: 2021-01-14 | Payer: PRIVATE HEALTH INSURANCE

## 2021-01-14 DIAGNOSIS — Z950 Presence of cardiac pacemaker: Secondary | ICD-10-CM

## 2021-01-14 NOTE — Progress Notes
Pt to have MRI C spine.     Device Conditional. Needs only device check prior.     On 02/17/21 needs Device check @ 9, MRI @ 10.

## 2021-01-21 ENCOUNTER — Encounter: Admit: 2021-01-21 | Discharge: 2021-01-21 | Payer: PRIVATE HEALTH INSURANCE

## 2021-01-21 NOTE — Progress Notes
In range so no call placed.

## 2021-02-05 ENCOUNTER — Encounter

## 2021-02-05 DIAGNOSIS — I480 Paroxysmal atrial fibrillation: Secondary | ICD-10-CM

## 2021-02-05 NOTE — Progress Notes
WNL/MM

## 2021-02-13 ENCOUNTER — Encounter: Admit: 2021-02-13 | Discharge: 2021-02-13 | Payer: PRIVATE HEALTH INSURANCE

## 2021-02-13 DIAGNOSIS — E782 Mixed hyperlipidemia: Secondary | ICD-10-CM

## 2021-02-13 DIAGNOSIS — I4891 Unspecified atrial fibrillation: Secondary | ICD-10-CM

## 2021-02-13 DIAGNOSIS — I2510 Atherosclerotic heart disease of native coronary artery without angina pectoris: Secondary | ICD-10-CM

## 2021-02-13 DIAGNOSIS — E039 Hypothyroidism, unspecified: Secondary | ICD-10-CM

## 2021-02-13 MED ORDER — EZETIMIBE 10 MG PO TAB
ORAL_TABLET | Freq: Every day | 1 refills | Status: AC
Start: 2021-02-13 — End: ?

## 2021-02-17 ENCOUNTER — Encounter: Admit: 2021-02-17 | Discharge: 2021-02-17 | Payer: PRIVATE HEALTH INSURANCE

## 2021-02-17 ENCOUNTER — Ambulatory Visit: Admit: 2021-02-17 | Discharge: 2021-02-17 | Payer: MEDICARE

## 2021-02-17 ENCOUNTER — Ambulatory Visit: Admit: 2021-02-17 | Discharge: 2021-02-17 | Payer: PRIVATE HEALTH INSURANCE

## 2021-02-17 DIAGNOSIS — Z950 Presence of cardiac pacemaker: Secondary | ICD-10-CM

## 2021-02-17 DIAGNOSIS — R2689 Other abnormalities of gait and mobility: Secondary | ICD-10-CM

## 2021-02-17 DIAGNOSIS — R292 Abnormal reflex: Secondary | ICD-10-CM

## 2021-02-17 MED ORDER — GADOBENATE DIMEGLUMINE 529 MG/ML (0.1MMOL/0.2ML) IV SOLN
18 mL | Freq: Once | INTRAVENOUS | 0 refills | Status: CP
Start: 2021-02-17 — End: ?
  Administered 2021-02-17: 17:00:00 18 mL via INTRAVENOUS

## 2021-02-17 NOTE — Progress Notes
Procedure: MRI C Spine w wo contrast    Pacemaker Type: Conditional    Order verified: Yes    Allergies reviewed: Yes    Patient history reviewed: Yes      Conditional Pacemaker: Minimum requirements, vital signs needed pre/post scan (See DocFlowsheet for further).        Pt placed on MRI safe cardiac, BP and O2 monitors.   ?   Pre procedure Vital Signs (See DocFlowsheet for further).    MRI Device Settings placed by EP Device Staff, Ezekiel Slocumb RN.    Program Mode: DOO    Pacing Mode: On    If pacing mode on Pacing Rate set at: 80     Device returned back to prior MRI device settings post-scan by EP Device staff, Ezekiel Slocumb RN.    Post procedure Vital Signs (See DocFlowsheet for further).

## 2021-02-21 ENCOUNTER — Encounter: Admit: 2021-02-21 | Discharge: 2021-02-21 | Payer: PRIVATE HEALTH INSURANCE

## 2021-02-21 DIAGNOSIS — M4802 Spinal stenosis, cervical region: Secondary | ICD-10-CM

## 2021-02-21 NOTE — Telephone Encounter
Called Jonathan Humphrey about his MRI spine results- it showed severe stenosis. I will place a referral to see Neurosurgery at the spine center. He was agreeable to this.

## 2021-02-28 ENCOUNTER — Encounter: Admit: 2021-02-28 | Discharge: 2021-02-28 | Payer: PRIVATE HEALTH INSURANCE

## 2021-02-28 NOTE — Telephone Encounter
Pt called back and requested later appointment time. Time updated ton 03/05/21 at 1015a

## 2021-02-28 NOTE — Telephone Encounter
I spoke to Clarise Cruz and requested to see if patient would be agreeable to switching his appointment to Wednesday 03/05/21 at 0830. Clarise Cruz is agreeable and will let patient know of date change and asked for a call back if this will not work.

## 2021-03-05 ENCOUNTER — Encounter: Admit: 2021-03-05 | Discharge: 2021-03-05 | Payer: PRIVATE HEALTH INSURANCE

## 2021-03-05 ENCOUNTER — Ambulatory Visit: Admit: 2021-03-05 | Discharge: 2021-03-05 | Payer: MEDICARE

## 2021-03-05 DIAGNOSIS — I10 Essential (primary) hypertension: Secondary | ICD-10-CM

## 2021-03-05 DIAGNOSIS — Z7901 Long term (current) use of anticoagulants: Secondary | ICD-10-CM

## 2021-03-05 DIAGNOSIS — L578 Other skin changes due to chronic exposure to nonionizing radiation: Secondary | ICD-10-CM

## 2021-03-05 DIAGNOSIS — M25569 Pain in unspecified knee: Secondary | ICD-10-CM

## 2021-03-05 DIAGNOSIS — M1990 Unspecified osteoarthritis, unspecified site: Secondary | ICD-10-CM

## 2021-03-05 DIAGNOSIS — I495 Sick sinus syndrome: Secondary | ICD-10-CM

## 2021-03-05 DIAGNOSIS — I4891 Unspecified atrial fibrillation: Secondary | ICD-10-CM

## 2021-03-05 DIAGNOSIS — I472 Ventricular tachycardia: Secondary | ICD-10-CM

## 2021-03-05 DIAGNOSIS — E785 Hyperlipidemia, unspecified: Secondary | ICD-10-CM

## 2021-03-05 DIAGNOSIS — I480 Paroxysmal atrial fibrillation: Secondary | ICD-10-CM

## 2021-03-05 DIAGNOSIS — E039 Hypothyroidism, unspecified: Secondary | ICD-10-CM

## 2021-03-05 DIAGNOSIS — B353 Tinea pedis: Secondary | ICD-10-CM

## 2021-03-05 DIAGNOSIS — L304 Erythema intertrigo: Secondary | ICD-10-CM

## 2021-03-05 DIAGNOSIS — R519 Generalized headaches: Secondary | ICD-10-CM

## 2021-03-05 DIAGNOSIS — F5221 Male erectile disorder: Secondary | ICD-10-CM

## 2021-03-05 DIAGNOSIS — E7800 Pure hypercholesterolemia, unspecified: Secondary | ICD-10-CM

## 2021-03-05 DIAGNOSIS — I2510 Atherosclerotic heart disease of native coronary artery without angina pectoris: Secondary | ICD-10-CM

## 2021-03-05 DIAGNOSIS — R42 Dizziness and giddiness: Secondary | ICD-10-CM

## 2021-03-05 DIAGNOSIS — R001 Bradycardia, unspecified: Secondary | ICD-10-CM

## 2021-03-05 DIAGNOSIS — D489 Neoplasm of uncertain behavior, unspecified: Secondary | ICD-10-CM

## 2021-03-05 DIAGNOSIS — L570 Actinic keratosis: Secondary | ICD-10-CM

## 2021-03-05 DIAGNOSIS — H9190 Unspecified hearing loss, unspecified ear: Secondary | ICD-10-CM

## 2021-03-05 DIAGNOSIS — K219 Gastro-esophageal reflux disease without esophagitis: Secondary | ICD-10-CM

## 2021-03-05 DIAGNOSIS — B079 Viral wart, unspecified: Secondary | ICD-10-CM

## 2021-03-05 DIAGNOSIS — J302 Other seasonal allergic rhinitis: Secondary | ICD-10-CM

## 2021-03-05 DIAGNOSIS — B351 Tinea unguium: Secondary | ICD-10-CM

## 2021-03-05 DIAGNOSIS — Z823 Family history of stroke: Secondary | ICD-10-CM

## 2021-03-05 DIAGNOSIS — Z950 Presence of cardiac pacemaker: Secondary | ICD-10-CM

## 2021-03-05 DIAGNOSIS — L821 Other seborrheic keratosis: Secondary | ICD-10-CM

## 2021-03-05 DIAGNOSIS — R5383 Other fatigue: Secondary | ICD-10-CM

## 2021-03-05 LAB — COMPREHENSIVE METABOLIC PANEL
Lab: 142 MMOL/L (ref 137–147)
Lab: 5.1 MMOL/L (ref 3.5–5.1)
Lab: 89 mg/dL — ABNORMAL HIGH (ref 70–100)

## 2021-03-05 LAB — CBC AND DIFF
Lab: 4.7 M/UL (ref 4.4–5.5)
Lab: 7.4 K/UL (ref 4.5–11.0)

## 2021-03-05 LAB — VITAMIN B12: Lab: 511 pg/mL — ABNORMAL HIGH (ref 180–914)

## 2021-03-05 NOTE — Patient Instructions
1. Please make a follow up appointment in 6 months with Dr. Trudee Kuster.    2. Please have labs drawn today.    3. Your INR today was 1.5. Please increase warfarin to 6 mg tonight and then resume normal dosing. Please recheck INR on Monday.    Please send a MyChart message or call the Cardiology [facility] Team with questions, [phone], Melanie RN, Caryl Pina RN, Wilford Sports, RN, Heather RN, [facility] LPN    You may receive test results in [place] before the ordering provider has reviewed them. Our care team will follow up with you after reviewing the tests to discuss your care. This may take up to 5-7 business days if results are not urgently needing to be addressed. Thank you for your patience.

## 2021-03-12 ENCOUNTER — Encounter: Admit: 2021-03-12 | Discharge: 2021-03-12 | Payer: PRIVATE HEALTH INSURANCE

## 2021-03-12 DIAGNOSIS — I480 Paroxysmal atrial fibrillation: Secondary | ICD-10-CM

## 2021-03-25 ENCOUNTER — Encounter: Admit: 2021-03-25 | Discharge: 2021-03-25 | Payer: PRIVATE HEALTH INSURANCE

## 2021-03-25 DIAGNOSIS — I480 Paroxysmal atrial fibrillation: Secondary | ICD-10-CM

## 2021-04-01 ENCOUNTER — Encounter: Admit: 2021-04-01 | Discharge: 2021-04-01 | Payer: PRIVATE HEALTH INSURANCE

## 2021-04-01 NOTE — Telephone Encounter
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 See remote report for further detail.     Pt has upcoming device check and OV w/ RCP on 5/11.

## 2021-04-01 NOTE — Telephone Encounter
Called patient to assess the alerts, patient stated he did not notice anything.     Denied any signs or symptoms and had no further questions. We will see him in May with RCP.

## 2021-04-03 ENCOUNTER — Encounter: Admit: 2021-04-03 | Discharge: 2021-04-03 | Payer: PRIVATE HEALTH INSURANCE

## 2021-04-03 DIAGNOSIS — E039 Hypothyroidism, unspecified: Secondary | ICD-10-CM

## 2021-04-03 DIAGNOSIS — E782 Mixed hyperlipidemia: Secondary | ICD-10-CM

## 2021-04-03 DIAGNOSIS — I2510 Atherosclerotic heart disease of native coronary artery without angina pectoris: Secondary | ICD-10-CM

## 2021-04-03 DIAGNOSIS — I4891 Unspecified atrial fibrillation: Secondary | ICD-10-CM

## 2021-04-03 MED ORDER — LEVOTHYROXINE 50 MCG PO TAB
ORAL_TABLET | Freq: Every day | ORAL | 0 refills | 30.00000 days | Status: AC
Start: 2021-04-03 — End: ?

## 2021-04-03 NOTE — Telephone Encounter
Med Refill Request received.    Patient last seen 12/24/20 with plan to continue Thyroid medications.    Follow up appt scheduled for 06/24/21.    3 months and 0 refills e-scribed per protocol.

## 2021-04-07 ENCOUNTER — Encounter: Admit: 2021-04-07 | Discharge: 2021-04-07 | Payer: PRIVATE HEALTH INSURANCE

## 2021-04-07 DIAGNOSIS — I10 Essential (primary) hypertension: Secondary | ICD-10-CM

## 2021-04-07 DIAGNOSIS — G959 Disease of spinal cord, unspecified: Secondary | ICD-10-CM

## 2021-04-07 MED ORDER — LISINOPRIL 20 MG PO TAB
20 mg | ORAL_TABLET | Freq: Every day | ORAL | 3 refills | Status: AC
Start: 2021-04-07 — End: ?

## 2021-04-07 NOTE — Patient Instructions
It was nice to see you today.  Thank you for choosing to visit our clinic.  Your time is important, and if you had to wait today, we do apologize.  Our goal is to run exactly on time.  However, on occasion, we get behind in clinic due to unexpected patient issues.  Thank you for your patience.    General Instructions:   Scheduling:  Our scheduling phone number is [phone].   Appointment Reminders on your cell phone:  Make sure we have your cell phone number in your account, and text [place] to [phone].   How to reach our office:  Please send a MyChart message to the Spine Center or leave a voicemail for the nurse Journii Nierman, RN, BSN at [phone].   How to get a medication refill:  Please use the MyChart Refill request or contact your pharmacy directly to request medication refills.  Please allow 72 business hours for request to be completed.     Support for many chronic illnesses is available through [facility] at [email or web address] or [phone].     For help with MyChart:  please call [phone].     For questions on nights, weekends or holidays:  call the Operator at [phone], and ask for the doctor on call for Neurosurgery.     For more information on spinal conditions:  please visit [email or web address]    Our office fax number is [phone]    Again, thank you for coming in today.     Aubriauna Riner, RN, BSN  Clinical Nurse Coordinator for Dr. Ifije Ohiorhenuan  Marc A. Asher, MD, [facility]  [facility]  Phone [phone]  Fax [phone]

## 2021-04-07 NOTE — Telephone Encounter
Refill authorization request received by [employer].

## 2021-04-09 ENCOUNTER — Encounter: Admit: 2021-04-09 | Discharge: 2021-04-09 | Payer: PRIVATE HEALTH INSURANCE

## 2021-04-09 ENCOUNTER — Ambulatory Visit: Admit: 2021-04-09 | Discharge: 2021-04-09 | Payer: PRIVATE HEALTH INSURANCE

## 2021-04-09 ENCOUNTER — Ambulatory Visit: Admit: 2021-04-09 | Discharge: 2021-04-09 | Payer: MEDICARE

## 2021-04-09 DIAGNOSIS — B351 Tinea unguium: Secondary | ICD-10-CM

## 2021-04-09 DIAGNOSIS — M25569 Pain in unspecified knee: Secondary | ICD-10-CM

## 2021-04-09 DIAGNOSIS — F5221 Male erectile disorder: Secondary | ICD-10-CM

## 2021-04-09 DIAGNOSIS — I4891 Unspecified atrial fibrillation: Secondary | ICD-10-CM

## 2021-04-09 DIAGNOSIS — R42 Dizziness and giddiness: Secondary | ICD-10-CM

## 2021-04-09 DIAGNOSIS — I480 Paroxysmal atrial fibrillation: Secondary | ICD-10-CM

## 2021-04-09 DIAGNOSIS — I495 Sick sinus syndrome: Secondary | ICD-10-CM

## 2021-04-09 DIAGNOSIS — I10 Essential (primary) hypertension: Secondary | ICD-10-CM

## 2021-04-09 DIAGNOSIS — L304 Erythema intertrigo: Secondary | ICD-10-CM

## 2021-04-09 DIAGNOSIS — D489 Neoplasm of uncertain behavior, unspecified: Secondary | ICD-10-CM

## 2021-04-09 DIAGNOSIS — L578 Other skin changes due to chronic exposure to nonionizing radiation: Secondary | ICD-10-CM

## 2021-04-09 DIAGNOSIS — H9190 Unspecified hearing loss, unspecified ear: Secondary | ICD-10-CM

## 2021-04-09 DIAGNOSIS — B079 Viral wart, unspecified: Secondary | ICD-10-CM

## 2021-04-09 DIAGNOSIS — E039 Hypothyroidism, unspecified: Secondary | ICD-10-CM

## 2021-04-09 DIAGNOSIS — R5383 Other fatigue: Secondary | ICD-10-CM

## 2021-04-09 DIAGNOSIS — K219 Gastro-esophageal reflux disease without esophagitis: Secondary | ICD-10-CM

## 2021-04-09 DIAGNOSIS — R519 Generalized headaches: Secondary | ICD-10-CM

## 2021-04-09 DIAGNOSIS — R001 Bradycardia, unspecified: Secondary | ICD-10-CM

## 2021-04-09 DIAGNOSIS — E785 Hyperlipidemia, unspecified: Secondary | ICD-10-CM

## 2021-04-09 DIAGNOSIS — L570 Actinic keratosis: Secondary | ICD-10-CM

## 2021-04-09 DIAGNOSIS — L821 Other seborrheic keratosis: Secondary | ICD-10-CM

## 2021-04-09 DIAGNOSIS — Z7901 Long term (current) use of anticoagulants: Secondary | ICD-10-CM

## 2021-04-09 DIAGNOSIS — B353 Tinea pedis: Secondary | ICD-10-CM

## 2021-04-09 DIAGNOSIS — M5020 Other cervical disc displacement, unspecified cervical region: Secondary | ICD-10-CM

## 2021-04-09 DIAGNOSIS — M1990 Unspecified osteoarthritis, unspecified site: Secondary | ICD-10-CM

## 2021-04-09 DIAGNOSIS — G959 Disease of spinal cord, unspecified: Secondary | ICD-10-CM

## 2021-04-09 DIAGNOSIS — J302 Other seasonal allergic rhinitis: Secondary | ICD-10-CM

## 2021-04-09 DIAGNOSIS — Z823 Family history of stroke: Secondary | ICD-10-CM

## 2021-04-09 NOTE — Progress Notes
Date of Service: 04/09/2021        Chief complaint    Chief Complaint   Patient presents with   ? New Patient     Spinal Stenosis Cervical regions       HPI  [facility] Jonathan Humphrey is a 87 y.o. male who presents with a history of imbalance here for evaluation of cervical stenosis.  He reports that he has some difficulty with balance.  He states that when he first stands up he sometimes lifts to the side and has difficulty walking a straight line.  He attributes this mostly to inability to track properly.  He denies any significant weakness or clumsiness in his legs.  Denies any difficulty with fine motor tasks.  He ambulates independently.  He does not have any significant neck pain.  He is not having spasms in his legs or hands.    PMH    Medical History:   Diagnosis Date   ? AK (actinic keratosis)     scalp   ? Arthritis    ? Atrial fibrillation (HCC) 01/16/2009   ? Chronic anticoagulation 01/16/2009   ? Coronary atherosclerosis 09/01/2006    Coronary artery disease   ? Cyst     right upper back   ? Dizziness 01/19/2007   ? 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 Essential hypertension 04/11/2009   ? Family history of cerebrovascular accident (CVA) 01/19/2007   ? Fatigue 07/21/2007   ? Generalized headaches    ? GERD (gastroesophageal reflux disease) 12/30/2006   ? Hearing loss 01/19/2007   ? HLD (hyperlipidemia) 04/11/2009   ? Hyperlipidemia 09/01/2006   ? Hypertension, essential 09/01/2006   ? Hypothyroidism 11/30/2007   ? Intertrigo    ? Knee pain 12/30/2006   ? Neoplasm of uncertain behavior    ? Onychomycosis    ? PAF (paroxysmal atrial fibrillation) (HCC) 04/11/2009   ? Photoaging of skin    ? Seasonal allergic reaction    ? Sinus bradycardia    ? SK (seborrheic keratosis)     face, scalp, right ear   ? SSS (sick sinus syndrome) (HCC)    ? Tinea pedis    ? Verruca vulgaris            ROS  Review of Systems   All other systems reviewed and are negative.         FH    Family History Problem Relation Age of Onset   ? High Cholesterol Mother    ? Hypertension Mother    ? Stroke Father    ? Cancer Father      Social History     Socioeconomic History   ? Marital status: Married     Spouse name: Not on file   ? Number of children: Not on file   ? Years of education: Not on file   ? Highest education level: Not on file   Occupational History   ? Not on file   Tobacco Use   ? Smoking status: Never Smoker   ? Smokeless tobacco: Never Used   Vaping Use   ? Vaping Use: Never used   Substance and Sexual Activity   ? Alcohol use: Yes     Alcohol/week: 7.0 standard drinks     Types: 7 Shots of liquor per week     Comment: 2-3 ounces daily   ? Drug use: No   ? Sexual activity: Not on file   Other Topics Concern   ?  Not on file   Social History Narrative   ? Not on file           [facility]    Surgical History:   Procedure Laterality Date   ? LEFT HEART CATHETERIZATION  05/1994    no stents   ? CORONARY ANGIOPLASTY  05/1994    [facility], no stents   ? SKIN BIOPSY  09/24/2006    shave biopsy   ? CARDIOVERSION  03/11/2009   ? 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 KNEE REPLACEMENT Right 10/09/14   ? Insert Permanent Pacemaker and Atrial and Ventricular Leads Left 10/21/2015    Performed by Smith Robert, MD at [facility] EP LAB   ? Fluoroscopy N/A 10/21/2015    Performed by Smith Robert, MD at [facility] EP LAB   ? REPAIR HERNIA UMBILICAL N/A 01/13/2016    Performed by Simonne Martinet, MD at [facility] OR   ? REPAIR HERNIA INGUINAL Bilateral 01/13/2016    Performed by Simonne Martinet, MD at [facility] OR   ? SKIN LESION REMOVAL  2018    3 spots on face by outside facility   ? ANGIOGRAPHY CORONARY ARTERY N/A 05/10/2017    Performed by Greig Castilla, MD at [facility] CATH LAB   ? POSSIBLE PERCUTANEOUS CORONARY STENT PLACEMENT WITH ANGIOPLASTY N/A 05/10/2017    Performed by Greig Castilla, MD at [facility] CATH LAB   ? ANGIOGRAPHY CORONARY ARTERY WITH LEFT HEART CATHETERIZATION N/A 07/05/2019    Performed by Harley Alto, MD at [facility] CATH LAB   ? POSSIBLE PERCUTANEOUS CORONARY STENT PLACEMENT WITH ANGIOPLASTY N/A 07/05/2019    Performed by Harley Alto, MD at [facility] CATH LAB       Meds    ? aspirin EC 81 mg tablet Take 1 tablet by mouth daily. Take with food.   ? atorvastatin (LIPITOR) 40 mg tablet Take one tablet by mouth at bedtime daily.   ? cholecalciferol (VITAMIN D-3) 50 mcg (2,000 unit) tablet Take 2,000 Units by mouth daily.   ? coenzyme Q10 100 mg cap Take 100 mg by mouth.   ? cyanocobalamin (VITAMIN B-12) 1,000 mcg tablet Take one tablet by mouth daily. Start this the day after taking your one time Vitamin B12 injection.   ? diazePAM (VALIUM) 5 mg tablet Take one tablet by mouth every 6 hours as needed for Anxiety. Indications: take 30 minutes prior to MRI   ? ezetimibe (ZETIA) 10 mg tablet Take 1 tablet by mouth once daily  Indications: excessive fat in the blood   ? finasteride (PROSCAR) 5 mg tablet Take one tablet by mouth daily.   ? levothyroxine (SYNTHROID) 50 mcg tablet Take 1 tablet by mouth once daily   ? lisinopriL (ZESTRIL) 20 mg tablet Take one tablet by mouth daily.   ? metoprolol XL (TOPROL XL) 50 mg extended release tablet Take one tablet by mouth at bedtime daily.   ? OMEGA-3 FATTY ACIDS/FISH OIL (FISH OIL-OMEGA-3 FATTY ACIDS PO) Take 3 tablets by mouth daily.   ? tamsulosin (FLOMAX) 0.4 mg capsule TAKE 2 CAPSULES 30 MINUTES FOLLOWING THE SAME MEAL DAILY. DO NOT CRUSH, CHEW OR OPEN CAPSULES.   ? warfarin (COUMADIN) 4 mg tablet TAKE 1 TABLET DAILY OR AS INSTRUCTED BY DR. Vivianne Spence       Allergies    No Known Allergies      Exam  Physical Exam   Constitutional: he is oriented to person, place, and time.  he appears well-developed and well-nourished.    Head: Normocephalic and atraumatic.    Eyes: Conjunctivae and EOM are normal.    Pulmonary/Chest: Effort normal. No respiratory distress.   Neurological: he is alert and oriented to person, place, and time. No cranial nerve deficit or sensory deficit. he exhibits normal muscle tone. Coordination normal.   Skin: Skin is warm and dry.   Psychiatric: he has a normal mood and affect. his behavior is normal. Judgment and thought content normal.    Vitals reviewed.  A&Ox3  FS, TML, EOMI      D B Tr Hg IO  R 5/5 5/5 5/5 5/5 5/5  L 5/5 5/5 5/5 5/5 5/5        HF KE DF PF EHL  R  5/5 5/5 5/5 5/5 5/5  L  5/5 5/5 5/5 5/5 5/5  No Hoffman's reflex is present bilaterally      Vitals:   Vitals:    04/09/21 1011   BP: 135/82   BP Source: Arm, Left Upper   Pulse: 67   Resp: 18   SpO2: 100%   PainSc: Zero   Weight: 87.5 kg (193 lb)   Height: 172.7 cm (5' 8)     Body mass index is 29.35 kg/m?.      Imaging:   I independently reviewed the patient's imaging findings:  MRI of cervical spine reviewed notable for severe cervical stenosis at C4-5, C5-6, C6-7  Standing scoliosis films reviewed notable for leg length discrepancy with his right leg longer than his left    Assessment/Plan    87 year old male with history of gait imbalance here for evaluation of cervical stenosis  Imaging findings reviewed with the patient  No findings concerning for cervical myelopathy  No indication for surgical intervention  Explained that I think is gait instability is not related to her cervical spine  He does have a leg length discrepancy and I suggested that he see a podiatrist for a left-sided shoe lift which may help with some of his gait difficulties  Return to clinic as needed                                Encounter Medications   Medications   ? coenzyme Q10 100 mg cap     Sig: Take 100 mg by mouth.

## 2021-04-22 ENCOUNTER — Encounter: Admit: 2021-04-22 | Discharge: 2021-04-22 | Payer: PRIVATE HEALTH INSURANCE

## 2021-04-22 DIAGNOSIS — E782 Mixed hyperlipidemia: Secondary | ICD-10-CM

## 2021-04-22 DIAGNOSIS — I4891 Unspecified atrial fibrillation: Secondary | ICD-10-CM

## 2021-04-22 DIAGNOSIS — I2510 Atherosclerotic heart disease of native coronary artery without angina pectoris: Secondary | ICD-10-CM

## 2021-04-22 DIAGNOSIS — E039 Hypothyroidism, unspecified: Secondary | ICD-10-CM

## 2021-04-22 MED ORDER — ATORVASTATIN 40 MG PO TAB
40 mg | ORAL_TABLET | Freq: Every evening | ORAL | 0 refills | Status: AC
Start: 2021-04-22 — End: ?

## 2021-04-23 ENCOUNTER — Encounter: Admit: 2021-04-23 | Discharge: 2021-04-23 | Payer: PRIVATE HEALTH INSURANCE

## 2021-04-23 NOTE — Progress Notes
Received call from Seth Bake at [facility] that pt reported critically low INR of 1.3.     Called pt to discuss. He states he isn't aware of any missed pills but said maybe he dropped a pill by accident. Denies any diet changes or illnesses.     Asked pt to increase tonight's dose to 6 mg and tomorrow night's dose to 4 mg (16.7% weekly increase) and recheck his INR on Monday. Pt verbalized understanding and denied further questions.

## 2021-04-30 ENCOUNTER — Ambulatory Visit: Admit: 2021-04-30 | Discharge: 2021-04-30 | Payer: MEDICARE

## 2021-04-30 ENCOUNTER — Encounter: Admit: 2021-04-30 | Discharge: 2021-04-30 | Payer: PRIVATE HEALTH INSURANCE

## 2021-04-30 ENCOUNTER — Ambulatory Visit: Admit: 2021-04-30 | Discharge: 2021-04-30 | Payer: PRIVATE HEALTH INSURANCE

## 2021-04-30 DIAGNOSIS — Z823 Family history of stroke: Secondary | ICD-10-CM

## 2021-04-30 DIAGNOSIS — R7301 Impaired fasting glucose: Secondary | ICD-10-CM

## 2021-04-30 DIAGNOSIS — K219 Gastro-esophageal reflux disease without esophagitis: Secondary | ICD-10-CM

## 2021-04-30 DIAGNOSIS — I4891 Unspecified atrial fibrillation: Secondary | ICD-10-CM

## 2021-04-30 DIAGNOSIS — Z950 Presence of cardiac pacemaker: Secondary | ICD-10-CM

## 2021-04-30 DIAGNOSIS — I472 Ventricular tachycardia: Secondary | ICD-10-CM

## 2021-04-30 DIAGNOSIS — B353 Tinea pedis: Secondary | ICD-10-CM

## 2021-04-30 DIAGNOSIS — F5221 Male erectile disorder: Secondary | ICD-10-CM

## 2021-04-30 DIAGNOSIS — I480 Paroxysmal atrial fibrillation: Secondary | ICD-10-CM

## 2021-04-30 DIAGNOSIS — M1990 Unspecified osteoarthritis, unspecified site: Secondary | ICD-10-CM

## 2021-04-30 DIAGNOSIS — E7800 Pure hypercholesterolemia, unspecified: Secondary | ICD-10-CM

## 2021-04-30 DIAGNOSIS — B351 Tinea unguium: Secondary | ICD-10-CM

## 2021-04-30 DIAGNOSIS — I10 Essential (primary) hypertension: Secondary | ICD-10-CM

## 2021-04-30 DIAGNOSIS — J302 Other seasonal allergic rhinitis: Secondary | ICD-10-CM

## 2021-04-30 DIAGNOSIS — R519 Generalized headaches: Secondary | ICD-10-CM

## 2021-04-30 DIAGNOSIS — R001 Bradycardia, unspecified: Secondary | ICD-10-CM

## 2021-04-30 DIAGNOSIS — Z7901 Long term (current) use of anticoagulants: Secondary | ICD-10-CM

## 2021-04-30 DIAGNOSIS — R42 Dizziness and giddiness: Secondary | ICD-10-CM

## 2021-04-30 DIAGNOSIS — L570 Actinic keratosis: Secondary | ICD-10-CM

## 2021-04-30 DIAGNOSIS — E039 Hypothyroidism, unspecified: Secondary | ICD-10-CM

## 2021-04-30 DIAGNOSIS — R5383 Other fatigue: Secondary | ICD-10-CM

## 2021-04-30 DIAGNOSIS — L821 Other seborrheic keratosis: Secondary | ICD-10-CM

## 2021-04-30 DIAGNOSIS — E785 Hyperlipidemia, unspecified: Secondary | ICD-10-CM

## 2021-04-30 DIAGNOSIS — I495 Sick sinus syndrome: Secondary | ICD-10-CM

## 2021-04-30 DIAGNOSIS — H9190 Unspecified hearing loss, unspecified ear: Secondary | ICD-10-CM

## 2021-04-30 DIAGNOSIS — M25569 Pain in unspecified knee: Secondary | ICD-10-CM

## 2021-04-30 DIAGNOSIS — L578 Other skin changes due to chronic exposure to nonionizing radiation: Secondary | ICD-10-CM

## 2021-04-30 DIAGNOSIS — D489 Neoplasm of uncertain behavior, unspecified: Secondary | ICD-10-CM

## 2021-04-30 DIAGNOSIS — B079 Viral wart, unspecified: Secondary | ICD-10-CM

## 2021-04-30 DIAGNOSIS — L304 Erythema intertrigo: Secondary | ICD-10-CM

## 2021-04-30 LAB — LIVER FUNCTION PANEL
DIRECT BILIRUBIN: 0.2 mg/dL (ref <0.4)
TOTAL BILIRUBIN: 1 mg/dL (ref 0.3–1.2)

## 2021-04-30 LAB — TSH WITH FREE T4 REFLEX: TSH: 1.3 uU/mL (ref >40)

## 2021-04-30 LAB — CBC AND DIFF
RBC COUNT: 4.7 M/UL (ref 4.4–5.5)
WBC COUNT: 6.6 K/UL (ref 4.5–11.0)

## 2021-04-30 LAB — LIPID PROFILE
CHOLESTEROL: 145 mg/dL — ABNORMAL HIGH (ref <200)
LDL: 83 mg/dL (ref <100)
TRIGLYCERIDES: 95 mg/dL — ABNORMAL HIGH (ref <150)

## 2021-04-30 LAB — BASIC METABOLIC PANEL
POTASSIUM: 4.6 MMOL/L (ref 3.5–5.1)
SODIUM: 141 MMOL/L (ref 137–147)

## 2021-04-30 LAB — VITAMIN B12: VITAMIN B12: 708 pg/mL (ref 180–914)

## 2021-04-30 LAB — HEMOGLOBIN A1C: HEMOGLOBIN A1C: 5.6 % (ref 4.0–6.0)

## 2021-04-30 MED ORDER — PERFLUTREN LIPID MICROSPHERES 1.1 MG/ML IV SUSP
1-10 mL | Freq: Once | INTRAVENOUS | 0 refills | Status: CP | PRN
Start: 2021-04-30 — End: ?
  Administered 2021-04-30: 22:00:00 1 mL via INTRAVENOUS

## 2021-04-30 NOTE — Patient Instructions
please schedule an appointment with Dr. Pimentel in 3 months .  To schedule an appointment call [phone].     In order to provide you the best care possible we ask that you follow up as below:    For non-urgent questions please contact us through your MyChart account.   For all medication refills please contact your pharmacy or send a request through MyChart.     For all questions that may need to be addressed urgently please call the nursing triage voicemail at [phone] Monday - Friday 8-5 only. Please leave a detailed message with your name, date of birth, and reason for your call.      Please allow ~ 10 business days for the results of any testing to be reviewed. Please call our office if you have not heard from a nurse within this time frame.

## 2021-05-01 ENCOUNTER — Encounter: Admit: 2021-05-01 | Discharge: 2021-05-01 | Payer: PRIVATE HEALTH INSURANCE

## 2021-05-01 DIAGNOSIS — J302 Other seasonal allergic rhinitis: Secondary | ICD-10-CM

## 2021-05-01 DIAGNOSIS — K219 Gastro-esophageal reflux disease without esophagitis: Secondary | ICD-10-CM

## 2021-05-01 DIAGNOSIS — I10 Essential (primary) hypertension: Secondary | ICD-10-CM

## 2021-05-01 DIAGNOSIS — L821 Other seborrheic keratosis: Secondary | ICD-10-CM

## 2021-05-01 DIAGNOSIS — I4891 Unspecified atrial fibrillation: Secondary | ICD-10-CM

## 2021-05-01 DIAGNOSIS — M1990 Unspecified osteoarthritis, unspecified site: Secondary | ICD-10-CM

## 2021-05-01 DIAGNOSIS — F5221 Male erectile disorder: Secondary | ICD-10-CM

## 2021-05-01 DIAGNOSIS — L304 Erythema intertrigo: Secondary | ICD-10-CM

## 2021-05-01 DIAGNOSIS — R5383 Other fatigue: Secondary | ICD-10-CM

## 2021-05-01 DIAGNOSIS — E039 Hypothyroidism, unspecified: Secondary | ICD-10-CM

## 2021-05-01 DIAGNOSIS — B351 Tinea unguium: Secondary | ICD-10-CM

## 2021-05-01 DIAGNOSIS — H9190 Unspecified hearing loss, unspecified ear: Secondary | ICD-10-CM

## 2021-05-01 DIAGNOSIS — R42 Dizziness and giddiness: Secondary | ICD-10-CM

## 2021-05-01 DIAGNOSIS — Z7901 Long term (current) use of anticoagulants: Secondary | ICD-10-CM

## 2021-05-01 DIAGNOSIS — I495 Sick sinus syndrome: Secondary | ICD-10-CM

## 2021-05-01 DIAGNOSIS — Z823 Family history of stroke: Secondary | ICD-10-CM

## 2021-05-01 DIAGNOSIS — B353 Tinea pedis: Secondary | ICD-10-CM

## 2021-05-01 DIAGNOSIS — R001 Bradycardia, unspecified: Secondary | ICD-10-CM

## 2021-05-01 DIAGNOSIS — L570 Actinic keratosis: Secondary | ICD-10-CM

## 2021-05-01 DIAGNOSIS — R519 Generalized headaches: Secondary | ICD-10-CM

## 2021-05-01 DIAGNOSIS — B079 Viral wart, unspecified: Secondary | ICD-10-CM

## 2021-05-01 DIAGNOSIS — E785 Hyperlipidemia, unspecified: Secondary | ICD-10-CM

## 2021-05-01 DIAGNOSIS — D489 Neoplasm of uncertain behavior, unspecified: Secondary | ICD-10-CM

## 2021-05-01 DIAGNOSIS — L578 Other skin changes due to chronic exposure to nonionizing radiation: Secondary | ICD-10-CM

## 2021-05-01 DIAGNOSIS — I480 Paroxysmal atrial fibrillation: Secondary | ICD-10-CM

## 2021-05-01 DIAGNOSIS — M25569 Pain in unspecified knee: Secondary | ICD-10-CM

## 2021-05-01 NOTE — Telephone Encounter
Pt is on warfarin. He will need weekly INRs 4 weeks prior (and also 4 weeks post if he has DCCV) and will also need TEE scheduled in case of INR drop. Next opening for TEE is 7/7. Will confirm with RCP if it is okay to wait until July and then will call pt.

## 2021-05-01 NOTE — Telephone Encounter
-----   Message from Stephanie Acre, MD sent at 05/01/2021  1:29 PM CDT -----  Can you let him know echo looks normal and we should get him in for sotalol initiation.  thanks

## 2021-05-02 ENCOUNTER — Encounter: Admit: 2021-05-02 | Discharge: 2021-05-02 | Payer: PRIVATE HEALTH INSURANCE

## 2021-05-02 NOTE — Telephone Encounter
-----   Message from Louis Meckel, LPN sent at 6/96/7893 12:46 PM CDT -----  Regarding: RCP- Called at 12:33pm, he has hospital admit question. Call him at [phone].

## 2021-05-02 NOTE — Telephone Encounter
RC to patient.   He was wanting to know the medication that he was going to get started on.   Informed patient the medication was Sotalol. Patient is going to think about if he wants to start the medication or not and give [place] a call back.   Reviewed plan with the patient. Patient verbalized understanding and does not have any further questions or concerns. No further education requested from patient. Patient has our contact information for future needs.

## 2021-05-02 NOTE — Patient Instructions
Sotalol Admit Pre-Procedure Instructions    Patient Name: Jonathan Humphrey  MRN#: [id number]  Date of Birth: January 17, 1934 (87 y.o.)  Today's Date: 05/02/2021        ARRIVAL TIME  Please report to the Cardiology office on the ground floor by main entrance on TBD  Report at the following time: 10:30 am for labs and EKG    Of note, while this is the required arrival time, a hospital bed might not be available right away. Beds are not reserved for planned admissions. It is possible you might have to wait for a bed.    PROCEDURE  Your scheduled for Sotalol admission. This is a 3 day, 2 night stay. If after 3 doses your heart does not convert to normal rhythm, you will get a cardioversion.      DIET AND FLUID INTAKE  NO CAFFEINE FOR 24 HOURS PRIOR TO SCHEDULED PROCEDURE (I.E., COFFEE, CHOCOLATE, SODA)  You may eat breakfast the morning of admission.      SPECIAL MEDICATIONS INSTRUCTIONS  OK to take all of your morning medications as you normally would.       Additional Instructions  Bring photo ID and your health insurance card(s).    Wear comfortable clothes and don't bring valuables, other than photo identification card, with you to the hospital.    BRING A LIST OF YOUR CURRENT MEDICATONS WITH YOU to the hospital.    Please pack a bag for a three day / two night stay. You will need to be in the hospital for a total of five doses.    Please note, you will not be able to take a shower during this stay.  Monitoring for Sotalol administration requires constant cardiac monitoring.    You will take Sotalol twice a day. The dosing will be determined during your hospital stay.        ALLERGIES  No Known Allergies    CURRENT MEDICATIONS  Outpatient Encounter Medications as of 05/02/2021   Medication Sig Dispense Refill   ? aspirin EC 81 mg tablet Take 1 tablet by mouth daily. Take with food. 90 tablet 3   ? atorvastatin (LIPITOR) 40 mg tablet Take one tablet by mouth at bedtime daily. 90 tablet 0   ? cholecalciferol (VITAMIN D3) 50 mcg (2,000 unit) tablet Take 2,000 Units by mouth daily.     ? coenzyme Q10 100 mg cap Take 100 mg by mouth.     ? cyanocobalamin (VITAMIN B-12) 1,000 mcg tablet Take one tablet by mouth daily. Start this the day after taking your one time Vitamin B12 injection. 90 tablet 3   ? diazePAM (VALIUM) 5 mg tablet Take one tablet by mouth every 6 hours as needed for Anxiety. Indications: take 30 minutes prior to MRI 2 tablet 0   ? ezetimibe (ZETIA) 10 mg tablet Take 1 tablet by mouth once daily  Indications: excessive fat in the blood 90 tablet 1   ? finasteride (PROSCAR) 5 mg tablet Take one tablet by mouth daily. 90 tablet 1   ? levothyroxine (SYNTHROID) 50 mcg tablet Take 1 tablet by mouth once daily 90 tablet 0   ? lisinopriL (ZESTRIL) 20 mg tablet Take one tablet by mouth daily. 90 tablet 3   ? metoprolol XL (TOPROL XL) 50 mg extended release tablet Take one tablet by mouth at bedtime daily. 90 tablet 3   ? OMEGA-3 FATTY ACIDS/FISH OIL (FISH OIL-OMEGA-3 FATTY ACIDS PO) Take 3 tablets by mouth daily.     ?  tamsulosin (FLOMAX) 0.4 mg capsule TAKE 2 CAPSULES 30 MINUTES FOLLOWING THE SAME MEAL DAILY. DO NOT CRUSH, CHEW OR OPEN CAPSULES. 180 capsule 1   ? warfarin (COUMADIN) 4 mg tablet TAKE 1 TABLET DAILY OR AS INSTRUCTED BY DR. Vivianne Spence 90 tablet 3     No facility-administered encounter medications on file as of 05/02/2021.     _________________________________________  Form completed by: Janett Labella, RN  Date completed: 05/02/21  Method: Via MyChart.

## 2021-05-05 ENCOUNTER — Encounter: Admit: 2021-05-05 | Discharge: 2021-05-05 | Payer: PRIVATE HEALTH INSURANCE

## 2021-05-05 DIAGNOSIS — I472 Ventricular tachycardia: Secondary | ICD-10-CM

## 2021-05-05 MED ORDER — METOPROLOL SUCCINATE 50 MG PO TB24
50 mg | ORAL_TABLET | Freq: Every evening | ORAL | 3 refills | 90.00000 days | Status: AC
Start: 2021-05-05 — End: ?

## 2021-05-06 ENCOUNTER — Encounter: Admit: 2021-05-06 | Discharge: 2021-05-06 | Payer: PRIVATE HEALTH INSURANCE

## 2021-05-13 ENCOUNTER — Encounter: Admit: 2021-05-13 | Discharge: 2021-05-13 | Payer: PRIVATE HEALTH INSURANCE

## 2021-05-21 ENCOUNTER — Encounter: Admit: 2021-05-21 | Discharge: 2021-05-21 | Payer: PRIVATE HEALTH INSURANCE

## 2021-05-27 ENCOUNTER — Encounter: Admit: 2021-05-27 | Discharge: 2021-05-27 | Payer: PRIVATE HEALTH INSURANCE

## 2021-06-03 ENCOUNTER — Encounter: Admit: 2021-06-03 | Discharge: 2021-06-03 | Payer: PRIVATE HEALTH INSURANCE

## 2021-06-03 DIAGNOSIS — I358 Other nonrheumatic aortic valve disorders: Secondary | ICD-10-CM

## 2021-06-03 MED ORDER — WARFARIN 4 MG PO TAB
ORAL_TABLET | Freq: Every day | ORAL | 3 refills | 90.00000 days | Status: AC
Start: 2021-06-03 — End: ?

## 2021-06-11 ENCOUNTER — Encounter: Admit: 2021-06-11 | Discharge: 2021-06-11 | Payer: PRIVATE HEALTH INSURANCE

## 2021-06-11 NOTE — Progress Notes
INR wnl - 2.8. Maintain current regimen and recheck in 2 weeks. No call placed per pt preference.

## 2021-06-16 ENCOUNTER — Inpatient Hospital Stay: Admit: 2021-06-16 | Discharge: 2021-06-16 | Payer: MEDICARE

## 2021-06-18 ENCOUNTER — Encounter: Admit: 2021-06-18 | Discharge: 2021-06-18 | Payer: PRIVATE HEALTH INSURANCE

## 2021-06-19 ENCOUNTER — Encounter: Admit: 2021-06-19 | Discharge: 2021-06-19 | Payer: PRIVATE HEALTH INSURANCE

## 2021-06-19 DIAGNOSIS — I358 Other nonrheumatic aortic valve disorders: Secondary | ICD-10-CM

## 2021-06-19 DIAGNOSIS — I480 Paroxysmal atrial fibrillation: Secondary | ICD-10-CM

## 2021-06-19 DIAGNOSIS — I472 Ventricular tachycardia: Secondary | ICD-10-CM

## 2021-06-19 NOTE — Telephone Encounter
-----   Message from Shaune Leeks, RN sent at 06/18/2021  4:59 PM CDT -----  Regarding: FW: RCP- 6/29 sotalol admit  Please check with Janice/[facility] for next available sotalol admit (for VT, no TEE/DCCV needed)    Pt completely forgot and missed it on his calendar.    -new res forms need to be sent in  -add EKG and labs to schedule for new date  -send pt updated instructions  -needs weekly INR x4 weeks prior (home INR)  ----- Message -----  From: Arminda Resides, BSN  Sent: 06/18/2021   4:09 PM CDT  To: Cvm Nurse Ep Team A  Subject: RCP- 6/29 sotalol admit                          Patient was scheduled for admit on 6/29 for sotalol. No labs were ordered and no EKG. Assuming patient not coming? thanks

## 2021-06-19 NOTE — Telephone Encounter
Res form sent 5:33Pm on 6/30 to [facility] and [place]

## 2021-06-19 NOTE — Patient Instructions
Sotalol Admit Pre-Procedure Instructions    Patient Name: Jonathan Humphrey  MRN#: [id number]  Date of Birth: 03-Aug-1934 (87 y.o.)  Today's Date: 06/19/2021        ARRIVAL TIME    The [facility] is located at [address] [place], [place] [address]. Park in [employer] and enter through the  Main front doors to the hospital. The [facility] is located on the right-hand side as soon as you walk in.    Please report to the Cardiology office on the ground floor by main entrance on Monday, July 07, 2021  Report at the following time: 10:30 am for labs and EKG    Of note, while this is the required arrival time, a hospital bed might not be available right away. Beds are not reserved for planned admissions. It is possible you might have to wait for a bed.    PROCEDURE  Your scheduled for Sotalol admission. This is a 3 day, 2 night stay. If after 3 doses your heart does not convert to normal rhythm, you will get a cardioversion.      DIET AND FLUID INTAKE  NO CAFFEINE FOR 24 HOURS PRIOR TO SCHEDULED PROCEDURE (I.E., COFFEE, CHOCOLATE, SODA)    If you are scheduled for a Transesophageal ECHO prior to admission, nothing to eat or drink after midnight the night before.       SPECIAL MEDICATIONS INSTRUCTIONS  OK to take all morning medications as you would.    Additional Instructions  Bring photo ID and your health insurance card(s).    Wear comfortable clothes and don't bring valuables, other than photo identification card, with you to the hospital.    BRING A LIST OF YOUR CURRENT MEDICATONS WITH YOU to the hospital.    Please pack a bag for a three day / two night stay. You will need to be in the hospital for a total of five doses.    Please note, you will not be able to take a shower during this stay.  Monitoring for Sotalol administration requires constant cardiac monitoring.    You will take Sotalol twice a day. The dosing will be determined during your hospital stay.        ALLERGIES  No Known Allergies    CURRENT MEDICATIONS  Outpatient Encounter Medications as of 06/19/2021   Medication Sig Dispense Refill   ? aspirin EC 81 mg tablet Take 1 tablet by mouth daily. Take with food. 90 tablet 3   ? atorvastatin (LIPITOR) 40 mg tablet Take one tablet by mouth at bedtime daily. 90 tablet 0   ? cholecalciferol (VITAMIN D3) 50 mcg (2,000 unit) tablet Take 2,000 Units by mouth daily.     ? coenzyme Q10 100 mg cap Take 100 mg by mouth.     ? cyanocobalamin (VITAMIN B-12) 1,000 mcg tablet Take one tablet by mouth daily. Start this the day after taking your one time Vitamin B12 injection. 90 tablet 3   ? diazePAM (VALIUM) 5 mg tablet Take one tablet by mouth every 6 hours as needed for Anxiety. Indications: take 30 minutes prior to MRI 2 tablet 0   ? ezetimibe (ZETIA) 10 mg tablet Take 1 tablet by mouth once daily  Indications: excessive fat in the blood 90 tablet 1   ? finasteride (PROSCAR) 5 mg tablet Take one tablet by mouth daily. 90 tablet 1   ? levothyroxine (SYNTHROID) 50 mcg tablet Take 1 tablet by mouth once daily 90  tablet 0   ? lisinopriL (ZESTRIL) 20 mg tablet Take one tablet by mouth daily. 90 tablet 3   ? metoprolol XL (TOPROL XL) 50 mg extended release tablet Take one tablet by mouth at bedtime daily. 90 tablet 3   ? OMEGA-3 FATTY ACIDS/FISH OIL (FISH OIL-OMEGA-3 FATTY ACIDS PO) Take 3 tablets by mouth daily.     ? tamsulosin (FLOMAX) 0.4 mg capsule TAKE 2 CAPSULES 30 MINUTES FOLLOWING THE SAME MEAL DAILY. DO NOT CRUSH, CHEW OR OPEN CAPSULES. 180 capsule 1   ? warfarin (COUMADIN) 4 mg tablet Take 1 tablet daily or as instructed by Dr. Vivianne Spence 90 tablet 3     No facility-administered encounter medications on file as of 06/19/2021.     _________________________________________  Form completed by: Cyndra Numbers, RN  Date completed: 06/19/21  Method: Via MyChart.

## 2021-06-24 ENCOUNTER — Ambulatory Visit: Admit: 2021-06-24 | Discharge: 2021-06-25 | Payer: MEDICARE

## 2021-06-24 ENCOUNTER — Encounter: Admit: 2021-06-24 | Discharge: 2021-06-24 | Payer: PRIVATE HEALTH INSURANCE

## 2021-06-24 DIAGNOSIS — R454 Irritability and anger: Secondary | ICD-10-CM

## 2021-06-24 DIAGNOSIS — Z7901 Long term (current) use of anticoagulants: Secondary | ICD-10-CM

## 2021-06-24 DIAGNOSIS — L821 Other seborrheic keratosis: Secondary | ICD-10-CM

## 2021-06-24 DIAGNOSIS — F5221 Male erectile disorder: Secondary | ICD-10-CM

## 2021-06-24 DIAGNOSIS — R519 Generalized headaches: Secondary | ICD-10-CM

## 2021-06-24 DIAGNOSIS — Z23 Encounter for immunization: Secondary | ICD-10-CM

## 2021-06-24 DIAGNOSIS — B079 Viral wart, unspecified: Secondary | ICD-10-CM

## 2021-06-24 DIAGNOSIS — L570 Actinic keratosis: Secondary | ICD-10-CM

## 2021-06-24 DIAGNOSIS — R001 Bradycardia, unspecified: Secondary | ICD-10-CM

## 2021-06-24 DIAGNOSIS — R2689 Other abnormalities of gait and mobility: Secondary | ICD-10-CM

## 2021-06-24 DIAGNOSIS — I480 Paroxysmal atrial fibrillation: Secondary | ICD-10-CM

## 2021-06-24 DIAGNOSIS — H9190 Unspecified hearing loss, unspecified ear: Secondary | ICD-10-CM

## 2021-06-24 DIAGNOSIS — L578 Other skin changes due to chronic exposure to nonionizing radiation: Secondary | ICD-10-CM

## 2021-06-24 DIAGNOSIS — K219 Gastro-esophageal reflux disease without esophagitis: Secondary | ICD-10-CM

## 2021-06-24 DIAGNOSIS — M25569 Pain in unspecified knee: Secondary | ICD-10-CM

## 2021-06-24 DIAGNOSIS — R42 Dizziness and giddiness: Secondary | ICD-10-CM

## 2021-06-24 DIAGNOSIS — I495 Sick sinus syndrome: Secondary | ICD-10-CM

## 2021-06-24 DIAGNOSIS — E039 Hypothyroidism, unspecified: Secondary | ICD-10-CM

## 2021-06-24 DIAGNOSIS — D489 Neoplasm of uncertain behavior, unspecified: Secondary | ICD-10-CM

## 2021-06-24 DIAGNOSIS — I4891 Unspecified atrial fibrillation: Secondary | ICD-10-CM

## 2021-06-24 DIAGNOSIS — R5383 Other fatigue: Secondary | ICD-10-CM

## 2021-06-24 DIAGNOSIS — M1990 Unspecified osteoarthritis, unspecified site: Secondary | ICD-10-CM

## 2021-06-24 DIAGNOSIS — B351 Tinea unguium: Secondary | ICD-10-CM

## 2021-06-24 DIAGNOSIS — Z823 Family history of stroke: Secondary | ICD-10-CM

## 2021-06-24 DIAGNOSIS — E785 Hyperlipidemia, unspecified: Secondary | ICD-10-CM

## 2021-06-24 DIAGNOSIS — J302 Other seasonal allergic rhinitis: Secondary | ICD-10-CM

## 2021-06-24 DIAGNOSIS — I10 Essential (primary) hypertension: Secondary | ICD-10-CM

## 2021-06-24 DIAGNOSIS — L304 Erythema intertrigo: Secondary | ICD-10-CM

## 2021-06-24 DIAGNOSIS — B353 Tinea pedis: Secondary | ICD-10-CM

## 2021-06-24 MED ORDER — ESCITALOPRAM OXALATE 5 MG PO TAB
5 mg | ORAL_TABLET | Freq: Every day | ORAL | 3 refills | Status: AC
Start: 2021-06-24 — End: ?

## 2021-06-24 NOTE — Progress Notes
Jonathan Humphrey is a 87 y.o. male here for follow up in GERIATRICS clinic    He is the patient of Dr Al-hihi who is on vacation.    Subjective     History of Present Illness:  He presents for follow up of multiple medical problems.  His concerns include:    ? Imbalance: frustrated, especially that he loves to ride a motorcycle and now doesn't feel safe doing this. He had a work up and there was not a clear determination of etiology. No falls. Does notice that he lists to one side when walking. No dizziness.   ? Vision a little worse: he has seen an ophtho but wants a second opinion  ? Admits he loses his temper. He says he doesn't hit or yell at his wife but gets very angry easily. They have a puppy who tried to jump up on the table and he got very angry recently though the anger dissipates quickly. This is different than he had been in the past.     Function: [place]age in [place] with his wife who has COPD and intermittent oxygen use. He is still able to mow on a riding mower and together, he and his wife get meals together. They have a lady that comes in once a week to help with housekeeping.       Medical History:   Diagnosis Date   ? AK (actinic keratosis)     scalp   ? Arthritis    ? Atrial fibrillation (HCC) 01/16/2009   ? Chronic anticoagulation 01/16/2009   ? Coronary atherosclerosis 09/01/2006    Coronary artery disease   ? Cyst     right upper back   ? Dizziness 01/19/2007   ? 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 Essential hypertension 04/11/2009   ? Family history of cerebrovascular accident (CVA) 01/19/2007   ? Fatigue 07/21/2007   ? Generalized headaches    ? GERD (gastroesophageal reflux disease) 12/30/2006   ? Hearing loss 01/19/2007   ? HLD (hyperlipidemia) 04/11/2009   ? Hyperlipidemia 09/01/2006   ? Hypertension, essential 09/01/2006   ? Hypothyroidism 11/30/2007   ? Intertrigo    ? Knee pain 12/30/2006   ? Neoplasm of uncertain behavior    ? Onychomycosis    ? PAF (paroxysmal atrial fibrillation) (HCC) 04/11/2009   ? Photoaging of skin    ? Seasonal allergic reaction    ? Sinus bradycardia    ? SK (seborrheic keratosis)     face, scalp, right ear   ? SSS (sick sinus syndrome) (HCC)    ? Tinea pedis    ? Verruca vulgaris       reports that he has never smoked. He has never used smokeless tobacco. He reports current alcohol use of about 7.0 standard drinks of alcohol per week. He reports that he does not use drugs.  No Known Allergies    Review of Systems   Constitutional:  Doesn't sleep a lot. That is not new.   Cardiovascular:  No chest pain or symptoms  Gastrointestinal:  No indigestion/heartburn. Bowels OK  Genitourinary:  Urge in AM.   Musculoskeletal:   Had vertebral compression fractures in the past.   Psychiatric:  As above    Medications   Current Outpatient Medications on File Prior to Visit   Medication Sig Dispense Refill   ? aspirin EC 81 mg tablet Take 1 tablet by mouth daily. Take with food. 90 tablet  3   ? atorvastatin (LIPITOR) 40 mg tablet Take one tablet by mouth at bedtime daily. 90 tablet 0   ? cholecalciferol (VITAMIN D3) 50 mcg (2,000 unit) tablet Take 2,000 Units by mouth daily.     ? coenzyme Q10 100 mg cap Take 100 mg by mouth.     ? cyanocobalamin (VITAMIN B-12) 1,000 mcg tablet Take one tablet by mouth daily. Start this the day after taking your one time Vitamin B12 injection. 90 tablet 3   ? ezetimibe (ZETIA) 10 mg tablet Take 1 tablet by mouth once daily  Indications: excessive fat in the blood 90 tablet 1   ? levothyroxine (SYNTHROID) 50 mcg tablet Take 1 tablet by mouth once daily 90 tablet 0   ? lisinopriL (ZESTRIL) 20 mg tablet Take one tablet by mouth daily. 90 tablet 3   ? metoprolol XL (TOPROL XL) 50 mg extended release tablet Take one tablet by mouth at bedtime daily. 90 tablet 3   ? OMEGA-3 FATTY ACIDS/FISH OIL (FISH OIL-OMEGA-3 FATTY ACIDS PO) Take 3 tablets by mouth daily.     ? warfarin (COUMADIN) 4 mg tablet Take 1 tablet daily or as instructed by Dr. Vivianne Spence 90 tablet 3     No current facility-administered medications on file prior to visit.       Objective  Vitals:    06/24/21 1148   BP: 114/73   BP Source: Arm, Right Upper   Pulse: 70   Temp: 36.4 ?C (97.6 ?F)   Resp: 18   SpO2: 97%   TempSrc: Oral   PainSc: Zero   Weight: 86.6 kg (191 lb)   Height: 170.2 cm (5' 7)       BP Readings from Last 6 Encounters:   06/24/21 114/73   04/30/21 107/72   04/09/21 135/82   03/05/21 124/72   02/17/21 121/85   01/02/21 127/75       Wt Readings from Last 3 Encounters:   06/24/21 86.6 kg (191 lb)   04/30/21 88.8 kg (195 lb 12.8 oz)   04/09/21 87.5 kg (193 lb)       Physical Exam   Constitutional:  Well appearing  Ears, Mouth, Nose and Throat:  EACs clear, hearing aide for left ea.   Cardiovascular: RRR  Respiratory: lungs clear  Musculoskeletal/Gait: able to get up quickly and easily. Gait is assymetric with scoliosis and concavity to the right. Right leg appears longer.   Neurologic: grossly fine. Good sensation in feet bilaterally  Psychiatric: appropriate     Anticoagulation on 06/11/2021   Component Date Value Ref Range Status   ? INR Home 06/11/2021 2.8   Final          Assessment & Plan     1) Imbalance, multifactorial    --Probably related to age, scoliosis, possibly previous vertebral trauma.     2) Anger   --Discussed Escitalopram 5 mg daily. He is interested in trying. Explained expected effects and potential side effects    3) Vision changes   --Referral to ophtho    4) Hearing loss   --Referral to audiology    Orders Placed This Encounter   ? Moderna (COVID-19) SARS-CoV-2 Vaccine BOOSTER   ? AUDIOLOGY   ? escitalopram oxalate (LEXAPRO) 5 mg tablet           There are no Patient Instructions on file for this visit.    Future Appointments   Date Time Provider Department Center   07/30/2021  8:30 AM [facility]  [facility] CVM Procedur   07/30/2021  9:15 AM Kathreen Cornfield, MD [facility] Exam   09/15/2021 10:00 AM Reubin Milan, MD [facility] Exam   12/23/2021 10:40 AM Al-Hihi, Roderic Scarce, MD [facility]

## 2021-06-25 ENCOUNTER — Encounter: Admit: 2021-06-25 | Discharge: 2021-06-25 | Payer: PRIVATE HEALTH INSURANCE

## 2021-06-30 ENCOUNTER — Encounter: Admit: 2021-06-30 | Discharge: 2021-06-30 | Payer: PRIVATE HEALTH INSURANCE

## 2021-06-30 NOTE — Progress Notes
INR WNL/AR

## 2021-07-02 ENCOUNTER — Encounter: Admit: 2021-07-02 | Discharge: 2021-07-02 | Payer: PRIVATE HEALTH INSURANCE

## 2021-07-03 ENCOUNTER — Encounter: Admit: 2021-07-03 | Discharge: 2021-07-03 | Payer: PRIVATE HEALTH INSURANCE

## 2021-07-04 ENCOUNTER — Inpatient Hospital Stay: Payer: MEDICARE

## 2021-07-07 ENCOUNTER — Encounter: Admit: 2021-07-07 | Discharge: 2021-07-07 | Payer: PRIVATE HEALTH INSURANCE

## 2021-07-07 ENCOUNTER — Ambulatory Visit: Admit: 2021-07-07 | Discharge: 2021-07-07 | Payer: MEDICARE

## 2021-07-07 DIAGNOSIS — I472 Ventricular tachycardia: Secondary | ICD-10-CM

## 2021-07-07 MED ADMIN — SOTALOL 80 MG PO TAB [11421]: 80 mg | ORAL | @ 23:00:00 | Stop: 2021-07-07 | NDC 00245001289

## 2021-07-07 NOTE — Progress Notes
INR 2.5 today, WNL. Pt admitted currently for sotalol initiation.

## 2021-07-08 MED ADMIN — EZETIMIBE 10 MG PO TAB [86887]: 10 mg | ORAL | @ 14:00:00 | NDC 67877049030

## 2021-07-08 MED ADMIN — DULOXETINE 30 MG PO CPDR [93424]: 30 mg | ORAL | @ 14:00:00 | NDC 57237001890

## 2021-07-08 MED ADMIN — ASPIRIN 81 MG PO TBEC [14113]: 81 mg | ORAL | @ 14:00:00 | NDC 00904678370

## 2021-07-08 MED ADMIN — CYANOCOBALAMIN (VITAMIN B-12) 500 MCG PO TAB [14673]: 1000 ug | ORAL | @ 14:00:00 | NDC 50268085411

## 2021-07-08 MED ADMIN — SOTALOL 120 MG PO TAB [15723]: 120 mg | ORAL | @ 19:00:00 | NDC 60505015900

## 2021-07-08 MED ADMIN — CHOLECALCIFEROL (VITAMIN D3) 25 MCG (1,000 UNIT) PO TAB [130074]: 2000 [IU] | ORAL | @ 14:00:00 | NDC 80681016900

## 2021-07-08 MED ADMIN — SOTALOL 120 MG PO TAB [15723]: 120 mg | ORAL | @ 09:00:00 | NDC 60505015900

## 2021-07-08 MED ADMIN — ATORVASTATIN 40 MG PO TAB [77113]: 40 mg | ORAL | @ 02:00:00 | NDC 60505258008

## 2021-07-08 MED ADMIN — LEVOTHYROXINE 25 MCG PO TAB [4420]: 50 ug | ORAL | @ 12:00:00 | NDC 51079044401

## 2021-07-08 MED ADMIN — LISINOPRIL 20 MG PO TAB [4526]: 20 mg | ORAL | @ 14:00:00 | NDC 00904679961

## 2021-07-08 MED ADMIN — WARFARIN 1 MG PO TAB [11664]: 2 mg | ORAL | @ 02:00:00 | Stop: 2021-07-08 | NDC 00832121189

## 2021-07-09 ENCOUNTER — Encounter: Admit: 2021-07-09 | Discharge: 2021-07-09 | Payer: PRIVATE HEALTH INSURANCE

## 2021-07-09 DIAGNOSIS — I2510 Atherosclerotic heart disease of native coronary artery without angina pectoris: Secondary | ICD-10-CM

## 2021-07-09 DIAGNOSIS — I4891 Unspecified atrial fibrillation: Secondary | ICD-10-CM

## 2021-07-09 DIAGNOSIS — E039 Hypothyroidism, unspecified: Secondary | ICD-10-CM

## 2021-07-09 DIAGNOSIS — E782 Mixed hyperlipidemia: Secondary | ICD-10-CM

## 2021-07-09 MED ORDER — LEVOTHYROXINE 50 MCG PO TAB
ORAL_TABLET | Freq: Every day | ORAL | 0 refills | 30.00000 days | Status: AC
Start: 2021-07-09 — End: ?

## 2021-07-09 MED ADMIN — CHOLECALCIFEROL (VITAMIN D3) 25 MCG (1,000 UNIT) PO TAB [130074]: 2000 [IU] | ORAL | @ 13:00:00 | Stop: 2021-07-09 | NDC 80681016900

## 2021-07-09 MED ADMIN — ASPIRIN 81 MG PO TBEC [14113]: 81 mg | ORAL | @ 13:00:00 | Stop: 2021-07-09 | NDC 00904678370

## 2021-07-09 MED ADMIN — WARFARIN 4 MG PO TAB [21372]: 4 mg | ORAL | @ 02:00:00 | NDC 00832121589

## 2021-07-09 MED ADMIN — LISINOPRIL 20 MG PO TAB [4526]: 20 mg | ORAL | @ 13:00:00 | Stop: 2021-07-09 | NDC 00904679961

## 2021-07-09 MED ADMIN — ATORVASTATIN 40 MG PO TAB [77113]: 40 mg | ORAL | @ 02:00:00 | NDC 00904629261

## 2021-07-09 MED ADMIN — DULOXETINE 30 MG PO CPDR [93424]: 30 mg | ORAL | @ 13:00:00 | Stop: 2021-07-09 | NDC 00904704461

## 2021-07-09 MED ADMIN — SOTALOL 120 MG PO TAB [15723]: 120 mg | ORAL | @ 05:00:00 | Stop: 2021-07-09 | NDC 60505015900

## 2021-07-09 MED ADMIN — SOTALOL 120 MG PO TAB [15723]: 120 mg | ORAL | @ 15:00:00 | Stop: 2021-07-09 | NDC 60505015900

## 2021-07-09 MED ADMIN — CYANOCOBALAMIN (VITAMIN B-12) 500 MCG PO TAB [14673]: 1000 ug | ORAL | @ 13:00:00 | Stop: 2021-07-09 | NDC 50268085411

## 2021-07-09 MED ADMIN — LEVOTHYROXINE 25 MCG PO TAB [4420]: 50 ug | ORAL | @ 11:00:00 | Stop: 2021-07-09 | NDC 51079044401

## 2021-07-09 MED ADMIN — EZETIMIBE 10 MG PO TAB [86887]: 10 mg | ORAL | @ 13:00:00 | Stop: 2021-07-09 | NDC 67877049030

## 2021-07-09 MED ADMIN — MAGNESIUM SULFATE IN D5W 1 GRAM/100 ML IV PGBK [166578]: 1 g | INTRAVENOUS | @ 14:00:00 | Stop: 2021-07-09 | NDC 00338170940

## 2021-07-10 ENCOUNTER — Encounter: Admit: 2021-07-10 | Discharge: 2021-07-10 | Payer: PRIVATE HEALTH INSURANCE

## 2021-07-10 NOTE — Telephone Encounter
Hospital Discharge Follow Up      Reached Patient:Yes  Patient Date of Birth: [birth year]     Admission Information:     Hospital Name: [facility]  Admission Date: 07/07/2021  Discharge Date: 07/09/2021    Admission Diagnosis: A-fib, Encounter for monitoring sotalol therapy  Discharge Diagnosis: Paroxysmal atrial fibrillation, Paroxysmal ventricular tachycardia, PTSD, NSVT, Aortic valve mass, Chroniotropic incompentance with sinus node dysfunction, Encounter for monitoring sotalol therapy Essential hypertension, PTSD, NSVT, Aortic valve mass, cardiac pacemaker in situ, Chronic heart failure with preserved ejection fraction  Has there been a discharge within the last 30 days? No  If yes, reason: N/A  Hospital Services: Unplanned  Today's call is 1(business) days post discharge      Discharge Instruction Review   Did patient receive and understand discharge instructions? Yes    Home Health ordered? No                 Agency name/telephone number: N/A   Has Home Health agency contacted patient? No   Caregiver assistance in the home? No   Are there concerns regarding the patient's ADL'S? No  Is patient a fall risk? Yes    Special diet? No If yes, type: Regular diet      Medication Reconciliation    Changes to pre-hospital medications? Yes   STOP taking:  escitalopram oxalate 5 mg tablet (LEXAPRO)  metoprolol XL 50 mg extended release tablet (TOPROL XL)  Were new prescriptions filled?Yes   START taking:  duloxetine DR (CYMBALTA)  sotalol (BETAPACE)  Meds reviewed and reconciled?Yes  ? aspirin EC 81 mg tablet Take 1 tablet by mouth daily. Take with food.   ? atorvastatin (LIPITOR) 40 mg tablet Take one tablet by mouth at bedtime daily.   ? cholecalciferol (VITAMIN D3) 50 mcg (2,000 unit) tablet Take 2,000 Units by mouth daily.   ? coenzyme Q10 100 mg cap Take 100 mg by mouth daily.   ? cyanocobalamin (VITAMIN B-12) 1,000 mcg tablet Take one tablet by mouth daily. Start this the day after taking your one time Vitamin B12 injection.   ? duloxetine DR (CYMBALTA) 30 mg capsule Take one capsule by mouth daily.   ? ezetimibe (ZETIA) 10 mg tablet Take 1 tablet by mouth once daily  Indications: excessive fat in the blood   ? levothyroxine (SYNTHROID) 50 mcg tablet TAKE 1 TABLET BY MOUTH ONCE DAILY   ? lisinopriL (ZESTRIL) 20 mg tablet Take one tablet by mouth daily.   ? OMEGA-3 FATTY ACIDS/FISH OIL (FISH OIL-OMEGA-3 FATTY ACIDS PO) Take 4 tablets by mouth daily.   ? sotaloL (BETAPACE) 120 mg tab Take one tablet by mouth twice daily.   ? warfarin (COUMADIN) 4 mg tablet Take 1 tablet daily or as instructed by Dr. Vivianne Spence (Patient taking differently: at bedtime daily. Take 1 tablet daily or as instructed by Dr. Vivianne Spence)         Understanding Condition   Having any current symptoms? No Patient denies shortness of breath, palpitations, chest pain, edema, cough, fever/chills, nausea/vomiting, diarrhea/constipation.  Patient denies checking his heart rate or blood pressure since hospital discharge.  Patient does c/o of having a balance problem that has been going on for a while and scheduled an appointment with pcp on 07/22/2021 to address that and hospital follow up.  Patient understands when to seek additional medical care? Yes   Other instructions provided : Activity as tolerated      Scheduling Follow-up Appointment   Upcoming appointment  date and time and with whom scheduled:   Future Appointments   Date Time Provider Department Center   07/11/2021 10:30 AM Anise Salvo, MD [facility] Ophthalmolog   07/22/2021 10:00 AM Al-Hihi, Roderic Scarce, MD [facility] IM   07/30/2021  8:30 AM [place] [facility] CVM Procedur   07/30/2021  9:15 AM Kathreen Cornfield, MD [facility] CVM Exam   09/15/2021 10:00 AM Reubin Milan, MD [facility] CVM Exam   12/23/2021 10:40 AM Al-Hihi, Roderic Scarce, MD [facility]     PCP appointment scheduled?Yes, Date: 07/22/2021 and 12/23/2021  PCP primary location: [facility]  Specialist appointment scheduled? Yes, with ophthalmology 07/11/2021; Cardiology 07/30/2021 and 09/15/2021  Both PCP and Specialist appointment scheduled: Yes  Is assistance with transportation needed?No   MyChart message sent? Active in MyChart. No message sent.     Yancey Flemings, RN

## 2021-07-11 ENCOUNTER — Encounter: Admit: 2021-07-11 | Discharge: 2021-07-11 | Payer: PRIVATE HEALTH INSURANCE

## 2021-07-11 ENCOUNTER — Encounter: Admit: 2021-07-01 | Discharge: 2021-07-01 | Payer: PRIVATE HEALTH INSURANCE

## 2021-07-11 ENCOUNTER — Ambulatory Visit: Admit: 2021-07-11 | Discharge: 2021-07-12 | Payer: MEDICARE

## 2021-07-11 DIAGNOSIS — R42 Dizziness and giddiness: Secondary | ICD-10-CM

## 2021-07-11 DIAGNOSIS — L570 Actinic keratosis: Secondary | ICD-10-CM

## 2021-07-11 DIAGNOSIS — K219 Gastro-esophageal reflux disease without esophagitis: Secondary | ICD-10-CM

## 2021-07-11 DIAGNOSIS — R5383 Other fatigue: Secondary | ICD-10-CM

## 2021-07-11 DIAGNOSIS — L304 Erythema intertrigo: Secondary | ICD-10-CM

## 2021-07-11 DIAGNOSIS — E785 Hyperlipidemia, unspecified: Secondary | ICD-10-CM

## 2021-07-11 DIAGNOSIS — Z9889 Other specified postprocedural states: Secondary | ICD-10-CM

## 2021-07-11 DIAGNOSIS — I4891 Unspecified atrial fibrillation: Secondary | ICD-10-CM

## 2021-07-11 DIAGNOSIS — F5221 Male erectile disorder: Secondary | ICD-10-CM

## 2021-07-11 DIAGNOSIS — D489 Neoplasm of uncertain behavior, unspecified: Secondary | ICD-10-CM

## 2021-07-11 DIAGNOSIS — B079 Viral wart, unspecified: Secondary | ICD-10-CM

## 2021-07-11 DIAGNOSIS — H9190 Unspecified hearing loss, unspecified ear: Secondary | ICD-10-CM

## 2021-07-11 DIAGNOSIS — R001 Bradycardia, unspecified: Secondary | ICD-10-CM

## 2021-07-11 DIAGNOSIS — E039 Hypothyroidism, unspecified: Secondary | ICD-10-CM

## 2021-07-11 DIAGNOSIS — L578 Other skin changes due to chronic exposure to nonionizing radiation: Secondary | ICD-10-CM

## 2021-07-11 DIAGNOSIS — J302 Other seasonal allergic rhinitis: Secondary | ICD-10-CM

## 2021-07-11 DIAGNOSIS — M25569 Pain in unspecified knee: Secondary | ICD-10-CM

## 2021-07-11 DIAGNOSIS — Z823 Family history of stroke: Secondary | ICD-10-CM

## 2021-07-11 DIAGNOSIS — L821 Other seborrheic keratosis: Secondary | ICD-10-CM

## 2021-07-11 DIAGNOSIS — I480 Paroxysmal atrial fibrillation: Secondary | ICD-10-CM

## 2021-07-11 DIAGNOSIS — Z7901 Long term (current) use of anticoagulants: Secondary | ICD-10-CM

## 2021-07-11 DIAGNOSIS — I495 Sick sinus syndrome: Secondary | ICD-10-CM

## 2021-07-11 DIAGNOSIS — R519 Generalized headaches: Secondary | ICD-10-CM

## 2021-07-11 DIAGNOSIS — B351 Tinea unguium: Secondary | ICD-10-CM

## 2021-07-11 DIAGNOSIS — B353 Tinea pedis: Secondary | ICD-10-CM

## 2021-07-11 DIAGNOSIS — M1990 Unspecified osteoarthritis, unspecified site: Secondary | ICD-10-CM

## 2021-07-11 DIAGNOSIS — I10 Essential (primary) hypertension: Secondary | ICD-10-CM

## 2021-07-11 NOTE — Progress Notes
Body mass index is 27.67 kg/m.    OCT:  OD: irregular retinal surface, outer retinal atrophy   OS: Preserved foveal contour with distinguished retinal layers.         Assessment and Plan:         Patient present for a second opinion after peeled membrane surgery OD 3-4 months ago. Subsequent splotch in the his central field of vision OD. He was told the reason is a speck of blood after surgery,      HTN on ttt  DLP on ttt  No DM    Heart condition? 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   Patient reports not seeing the centeral vision defect before his membrane peel surgery  Explained the need for OCT scans and images before his membrane surgery to be able to better compare images   F/u Dr.Baillie    2- PVD OS  Signs and symptoms of retinal tears and retinal detachment were reviewed in details with the patient. Patient was instructed to immediately present for evaluation or seek medical help if increased flashes, increased floaters, any decrease in vision, or curtain in field of vision.    3- PCIOL OU  Monitor     Signs and symptoms of retinal tears and retinal detachment were reviewed in details with the patient. Patient was instructed to immediately present for evaluation or seek medical help if increased flashes, increased floaters, any decrease in vision, or curtain in field of vision.

## 2021-07-12 DIAGNOSIS — H3589 Other specified retinal disorders: Principal | ICD-10-CM

## 2021-07-12 DIAGNOSIS — Z8669 Personal history of other diseases of the nervous system and sense organs: Secondary | ICD-10-CM

## 2021-07-12 DIAGNOSIS — Z961 Presence of intraocular lens: Secondary | ICD-10-CM

## 2021-07-12 DIAGNOSIS — H43812 Vitreous degeneration, left eye: Secondary | ICD-10-CM

## 2021-07-16 ENCOUNTER — Encounter: Admit: 2021-07-16 | Discharge: 2021-07-16 | Payer: PRIVATE HEALTH INSURANCE

## 2021-07-16 NOTE — Telephone Encounter
OK to continue meds per PW. Patient notified. Reviewed plan with the patient. Patient verbalized understanding and does not have any further questions or concerns. No further education requested from patient. Patient has our contact information for future needs.

## 2021-07-16 NOTE — Telephone Encounter
-----   Message from [place] sent at 07/16/2021  4:55 PM CDT -----  Regarding: EKG For Review in Jonathan Humphrey

## 2021-07-17 NOTE — Progress Notes
Pt hospitalized since last office visit: Yes    There are no preventive care reminders to display for this patient.    There are no diagnoses linked to this encounter.    Labs not done:      Lab Frequency Next Occurrence   REQUEST FOR CARDIOLOGY APPOINTMENT Once 12/09/2017   REQUEST FOR CARDIOLOGY APPOINTMENT Once 07/07/2019   REQUEST FOR CARDIOLOGY APPOINTMENT Once 03/11/2020   METHYLMALONIC ACID QUANT Once 01/02/2021   HOMOCYSTEINE Once 01/02/2021   VITAMIN B12 Once 01/02/2021   REQUEST FOR CARDIOLOGY APPOINTMENT Once 09/05/2021   DEVICE EVALUATION - PPM (PERMANENT PACEMAKER) Once 07/31/2021   REQUEST FOR CARDIOLOGY APPOINTMENT Once 07/31/2021   ECG 12-LEAD Once 07/07/2021   DEVICE EVALUATION - REMOTE PPM         No labs due at this time. HFU.    Notes to provider:

## 2021-07-21 ENCOUNTER — Encounter: Admit: 2021-07-21 | Discharge: 2021-07-21 | Payer: PRIVATE HEALTH INSURANCE

## 2021-07-21 DIAGNOSIS — I4891 Unspecified atrial fibrillation: Secondary | ICD-10-CM

## 2021-07-21 DIAGNOSIS — E039 Hypothyroidism, unspecified: Secondary | ICD-10-CM

## 2021-07-21 DIAGNOSIS — I2510 Atherosclerotic heart disease of native coronary artery without angina pectoris: Secondary | ICD-10-CM

## 2021-07-21 DIAGNOSIS — E782 Mixed hyperlipidemia: Secondary | ICD-10-CM

## 2021-07-21 MED ORDER — ATORVASTATIN 40 MG PO TAB
ORAL_TABLET | Freq: Every day | 0 refills | Status: AC
Start: 2021-07-21 — End: ?

## 2021-07-22 ENCOUNTER — Encounter: Admit: 2021-07-22 | Discharge: 2021-07-22 | Payer: PRIVATE HEALTH INSURANCE

## 2021-07-22 ENCOUNTER — Ambulatory Visit: Admit: 2021-07-22 | Discharge: 2021-07-23 | Payer: MEDICARE

## 2021-07-22 DIAGNOSIS — I480 Paroxysmal atrial fibrillation: Secondary | ICD-10-CM

## 2021-07-22 DIAGNOSIS — Z7901 Long term (current) use of anticoagulants: Secondary | ICD-10-CM

## 2021-07-22 DIAGNOSIS — M25569 Pain in unspecified knee: Secondary | ICD-10-CM

## 2021-07-22 DIAGNOSIS — K219 Gastro-esophageal reflux disease without esophagitis: Secondary | ICD-10-CM

## 2021-07-22 DIAGNOSIS — L578 Other skin changes due to chronic exposure to nonionizing radiation: Secondary | ICD-10-CM

## 2021-07-22 DIAGNOSIS — J302 Other seasonal allergic rhinitis: Secondary | ICD-10-CM

## 2021-07-22 DIAGNOSIS — H9190 Unspecified hearing loss, unspecified ear: Secondary | ICD-10-CM

## 2021-07-22 DIAGNOSIS — Z950 Presence of cardiac pacemaker: Secondary | ICD-10-CM

## 2021-07-22 DIAGNOSIS — B351 Tinea unguium: Secondary | ICD-10-CM

## 2021-07-22 DIAGNOSIS — B353 Tinea pedis: Secondary | ICD-10-CM

## 2021-07-22 DIAGNOSIS — N401 Enlarged prostate with lower urinary tract symptoms: Secondary | ICD-10-CM

## 2021-07-22 DIAGNOSIS — R519 Generalized headaches: Secondary | ICD-10-CM

## 2021-07-22 DIAGNOSIS — I10 Essential (primary) hypertension: Secondary | ICD-10-CM

## 2021-07-22 DIAGNOSIS — E039 Hypothyroidism, unspecified: Secondary | ICD-10-CM

## 2021-07-22 DIAGNOSIS — I2510 Atherosclerotic heart disease of native coronary artery without angina pectoris: Secondary | ICD-10-CM

## 2021-07-22 DIAGNOSIS — L304 Erythema intertrigo: Secondary | ICD-10-CM

## 2021-07-22 DIAGNOSIS — E785 Hyperlipidemia, unspecified: Secondary | ICD-10-CM

## 2021-07-22 DIAGNOSIS — F5221 Male erectile disorder: Secondary | ICD-10-CM

## 2021-07-22 DIAGNOSIS — I4891 Unspecified atrial fibrillation: Secondary | ICD-10-CM

## 2021-07-22 DIAGNOSIS — I495 Sick sinus syndrome: Secondary | ICD-10-CM

## 2021-07-22 DIAGNOSIS — L821 Other seborrheic keratosis: Secondary | ICD-10-CM

## 2021-07-22 DIAGNOSIS — B079 Viral wart, unspecified: Secondary | ICD-10-CM

## 2021-07-22 DIAGNOSIS — Z823 Family history of stroke: Secondary | ICD-10-CM

## 2021-07-22 DIAGNOSIS — R5383 Other fatigue: Secondary | ICD-10-CM

## 2021-07-22 DIAGNOSIS — R42 Dizziness and giddiness: Secondary | ICD-10-CM

## 2021-07-22 DIAGNOSIS — R7301 Impaired fasting glucose: Secondary | ICD-10-CM

## 2021-07-22 DIAGNOSIS — R001 Bradycardia, unspecified: Secondary | ICD-10-CM

## 2021-07-22 DIAGNOSIS — L570 Actinic keratosis: Secondary | ICD-10-CM

## 2021-07-22 DIAGNOSIS — M1990 Unspecified osteoarthritis, unspecified site: Secondary | ICD-10-CM

## 2021-07-22 DIAGNOSIS — D489 Neoplasm of uncertain behavior, unspecified: Secondary | ICD-10-CM

## 2021-07-22 MED ORDER — ATORVASTATIN 40 MG PO TAB
40 mg | ORAL_TABLET | Freq: Every day | ORAL | 3 refills | Status: AC
Start: 2021-07-22 — End: ?

## 2021-07-22 MED ORDER — EZETIMIBE 10 MG PO TAB
ORAL_TABLET | Freq: Every day | 3 refills | Status: AC
Start: 2021-07-22 — End: ?

## 2021-07-22 MED ORDER — LEVOTHYROXINE 50 MCG PO TAB
50 ug | ORAL_TABLET | Freq: Every day | ORAL | 3 refills | 30.00000 days | Status: AC
Start: 2021-07-22 — End: ?
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
## 2021-07-22 NOTE — Patient Instructions
Get fasting labs few days before return to clinic in 6 months

## 2021-07-23 DIAGNOSIS — E039 Hypothyroidism, unspecified: Secondary | ICD-10-CM

## 2021-07-23 DIAGNOSIS — E782 Mixed hyperlipidemia: Secondary | ICD-10-CM

## 2021-07-24 ENCOUNTER — Encounter: Admit: 2021-07-24 | Discharge: 2021-07-24 | Payer: PRIVATE HEALTH INSURANCE

## 2021-07-24 NOTE — Progress Notes
INR WNL - no call placed per pt preference. Recheck in 1 week and continue current warfarin regimen.

## 2021-07-30 ENCOUNTER — Ambulatory Visit: Admit: 2021-07-30 | Discharge: 2021-07-30 | Payer: MEDICARE

## 2021-07-30 ENCOUNTER — Encounter: Admit: 2021-07-30 | Discharge: 2021-07-30 | Payer: PRIVATE HEALTH INSURANCE

## 2021-07-30 DIAGNOSIS — R519 Generalized headaches: Secondary | ICD-10-CM

## 2021-07-30 DIAGNOSIS — B079 Viral wart, unspecified: Secondary | ICD-10-CM

## 2021-07-30 DIAGNOSIS — E785 Hyperlipidemia, unspecified: Secondary | ICD-10-CM

## 2021-07-30 DIAGNOSIS — D489 Neoplasm of uncertain behavior, unspecified: Secondary | ICD-10-CM

## 2021-07-30 DIAGNOSIS — I10 Essential (primary) hypertension: Secondary | ICD-10-CM

## 2021-07-30 DIAGNOSIS — M1990 Unspecified osteoarthritis, unspecified site: Secondary | ICD-10-CM

## 2021-07-30 DIAGNOSIS — J302 Other seasonal allergic rhinitis: Secondary | ICD-10-CM

## 2021-07-30 DIAGNOSIS — R001 Bradycardia, unspecified: Secondary | ICD-10-CM

## 2021-07-30 DIAGNOSIS — K219 Gastro-esophageal reflux disease without esophagitis: Secondary | ICD-10-CM

## 2021-07-30 DIAGNOSIS — L304 Erythema intertrigo: Secondary | ICD-10-CM

## 2021-07-30 DIAGNOSIS — L578 Other skin changes due to chronic exposure to nonionizing radiation: Secondary | ICD-10-CM

## 2021-07-30 DIAGNOSIS — I480 Paroxysmal atrial fibrillation: Secondary | ICD-10-CM

## 2021-07-30 DIAGNOSIS — L570 Actinic keratosis: Secondary | ICD-10-CM

## 2021-07-30 DIAGNOSIS — H9190 Unspecified hearing loss, unspecified ear: Secondary | ICD-10-CM

## 2021-07-30 DIAGNOSIS — B351 Tinea unguium: Secondary | ICD-10-CM

## 2021-07-30 DIAGNOSIS — M25569 Pain in unspecified knee: Secondary | ICD-10-CM

## 2021-07-30 DIAGNOSIS — E039 Hypothyroidism, unspecified: Secondary | ICD-10-CM

## 2021-07-30 DIAGNOSIS — I495 Sick sinus syndrome: Secondary | ICD-10-CM

## 2021-07-30 DIAGNOSIS — I472 Ventricular tachycardia: Secondary | ICD-10-CM

## 2021-07-30 DIAGNOSIS — R42 Dizziness and giddiness: Secondary | ICD-10-CM

## 2021-07-30 DIAGNOSIS — R5383 Other fatigue: Secondary | ICD-10-CM

## 2021-07-30 DIAGNOSIS — B353 Tinea pedis: Secondary | ICD-10-CM

## 2021-07-30 DIAGNOSIS — I4891 Unspecified atrial fibrillation: Secondary | ICD-10-CM

## 2021-07-30 DIAGNOSIS — Z950 Presence of cardiac pacemaker: Secondary | ICD-10-CM

## 2021-07-30 DIAGNOSIS — Z7901 Long term (current) use of anticoagulants: Secondary | ICD-10-CM

## 2021-07-30 DIAGNOSIS — F5221 Male erectile disorder: Secondary | ICD-10-CM

## 2021-07-30 DIAGNOSIS — Z823 Family history of stroke: Secondary | ICD-10-CM

## 2021-07-30 DIAGNOSIS — L821 Other seborrheic keratosis: Secondary | ICD-10-CM

## 2021-07-30 NOTE — Assessment & Plan Note
Device interrogation today shows stable sensing and pacing thresholds.  He did have 1 episode of nonsustained VT on July 25 at 9:30 PM lasting about 5 seconds.  It was asymptomatic.  In looking back, [place] cannot recall if it was a day that he forgot to take his medications.    I did not make any changes to his programming.

## 2021-07-30 NOTE — Assessment & Plan Note
He has been started on low-dose sotalol and tolerated it well.  Unfortunately, Jonathan Humphrey has had 1 episode of nonsustained ventricular tachycardia.  In talking to him, he admits that he sometimes forgets to take his medicines on time.  There have been a couple occasions where he has skipped a dose entirely.    We had a long talk about his medications.  I suggested that he set himself an alarm.  We also talked about optimal timing.  If he is 1 or 2 hours off, I would still have him take his medications.  He should not take his med if he is beyond 4 hours late.  He should simply skip a dose.  Jonathan Humphrey understands that it is important for him to take his medications as regularly as possible.  He is going to try harder to be on time.

## 2021-07-30 NOTE — Patient Instructions
please schedule an appointment with Dr. Pimentel in 1 year .  To schedule an appointment call [phone].     In order to provide you the best care possible we ask that you follow up as below:    For non-urgent questions please contact us through your MyChart account.   For all medication refills please contact your pharmacy or send a request through MyChart.     For all questions that may need to be addressed urgently please call the nursing triage voicemail at [phone] Monday - Friday 8-5 only. Please leave a detailed message with your name, date of birth, and reason for your call.      Please allow ~ 10 business days for the results of any testing to be reviewed. Please call our office if you have not heard from a nurse within this time frame.

## 2021-08-05 ENCOUNTER — Encounter: Admit: 2021-08-05 | Discharge: 2021-08-05 | Payer: PRIVATE HEALTH INSURANCE

## 2021-08-05 MED ORDER — SOTALOL 120 MG PO TAB
120 mg | ORAL_TABLET | Freq: Two times a day (BID) | ORAL | 2 refills | 30.00000 days | Status: AC
Start: 2021-08-05 — End: ?

## 2021-08-05 NOTE — Telephone Encounter
-----   Message from Louis Meckel, LPN sent at [phone] 11:14 AM CDT -----  Regarding: RCP- new Rx  VM on triage line from patient at 10:58am.  Michela Pitcher he is just about out of Sotalol and has no refills left.  He was given 30 days while in the hospital but needs 90 day supply with refills sent to [facility].  He is at [phone].

## 2021-08-05 NOTE — Telephone Encounter
RC to patient and informed him that a script was sent into [facility].  Reviewed plan with the patient. Patient verbalized understanding and does not have any further questions or concerns. No further education requested from patient. Patient has our contact information for future needs.

## 2021-08-06 ENCOUNTER — Encounter: Admit: 2021-08-06 | Discharge: 2021-08-06 | Payer: PRIVATE HEALTH INSURANCE

## 2021-08-06 NOTE — Progress Notes
INR WNL - no call placed per pt preference.

## 2021-08-13 ENCOUNTER — Encounter: Admit: 2021-08-13 | Discharge: 2021-08-13 | Payer: PRIVATE HEALTH INSURANCE

## 2021-09-01 ENCOUNTER — Encounter: Admit: 2021-09-01 | Discharge: 2021-09-01 | Payer: PRIVATE HEALTH INSURANCE

## 2021-09-01 NOTE — Progress Notes
09/01/2021 12:05 PM  INR 2.1 No call needed, mychart message sent, continue '2mg'$  warfarin Monday and Thursdays and '4mg'$  all other days. Recheck INR in 1 month.

## 2021-09-05 ENCOUNTER — Encounter: Admit: 2021-09-05 | Discharge: 2021-09-05 | Payer: PRIVATE HEALTH INSURANCE

## 2021-09-05 ENCOUNTER — Ambulatory Visit: Admit: 2021-09-05 | Discharge: 2021-09-05 | Payer: MEDICARE

## 2021-09-05 DIAGNOSIS — H903 Sensorineural hearing loss, bilateral: Secondary | ICD-10-CM

## 2021-09-05 DIAGNOSIS — H9190 Unspecified hearing loss, unspecified ear: Secondary | ICD-10-CM

## 2021-09-05 NOTE — Progress Notes
AUDIOLOGIC ASSESSMENT REPORT    HISTORY  Jonathan Humphrey "Jonathan Humphrey" was seen in the [facility] for a hearing evaluation.  He reported that his hearing loss had a gradual onset and reports a previous history of significant noise exposure.     PROCEDURE AND RESULTS  Otoscopy revealed non-occluding cerumen in the left ear and a clear canal in the right ear with normal eardrum appearance. Tympanometry was conducted to assess middle ear function and indicated normal middle ear compliance for both ears. Pure tone air and bone conduction results revealed a mild to profound sensorineural hearing loss bilaterally with a high-frequency asymmetry; worse in the right ear. Speech reception thresholds were measured at 50 dB HL in the right and 55 dB HL in the left ear. Word recognition scores were 80 % at 85 dB HL in the right ear and 76 % at 85 dB HL in the left ear.    IMPRESSIONS  Today's results suggest normal middle ear function and sensorineural hearing loss for both ears. With this degree of hearing loss, moderate deficits in communication ability would be expected in all listening environments, especially in noisy or reverberant places. Patient has worn amplification in the past and their current hearing aids (Phonak Deedra Ehrich Q-50) are around 85 years old. He has lost his right hearing aid and has only been wearing the left one for the past two years.    RECOMMENDATIONS   Trial with bilateral  amplification: Audeo P-50 R w/custom molds (quoted $2,635). He wished to explore his [employer] benefit and will contact our clinic if he wishes to proceed with hearing aids. If so, he will need to return for wire measurments, earmold impressions and color choice.   Hearing evaluations should be conducted as needed/annually to monitor for progression of hearing loss and sooner if sudden changes are noticed.   Contact our offices at [phone] if any questions or concerns arise.

## 2021-09-09 ENCOUNTER — Encounter: Admit: 2021-09-09 | Discharge: 2021-09-09 | Payer: PRIVATE HEALTH INSURANCE

## 2021-09-09 MED ORDER — SOTALOL 120 MG PO TAB
120 mg | ORAL_TABLET | Freq: Two times a day (BID) | ORAL | 1 refills | 30.00000 days | Status: AC
Start: 2021-09-09 — End: ?

## 2021-09-09 NOTE — Telephone Encounter
-----   Message from Louis Meckel, LPN sent at 06/16/349 11:15 AM CDT -----  Regarding: RCP- called at 10:44am, he needs new Rx for Sotalol 90 day supply. Call him at [phone].

## 2021-09-09 NOTE — Telephone Encounter
RC to patient.   Informed patient that a 90 day supply of Sotalol was sent into his pharmacy. Reviewed plan with the patient. Patient verbalized understanding and does not have any further questions or concerns. No further education requested from patient. Patient has our contact information for future needs.

## 2021-09-11 ENCOUNTER — Encounter: Admit: 2021-09-11 | Discharge: 2021-09-11 | Payer: PRIVATE HEALTH INSURANCE

## 2021-09-11 DIAGNOSIS — I480 Paroxysmal atrial fibrillation: Secondary | ICD-10-CM

## 2021-09-11 DIAGNOSIS — I495 Sick sinus syndrome: Secondary | ICD-10-CM

## 2021-09-15 ENCOUNTER — Encounter: Admit: 2021-09-15 | Discharge: 2021-09-15 | Payer: PRIVATE HEALTH INSURANCE

## 2021-09-15 ENCOUNTER — Ambulatory Visit: Admit: 2021-09-15 | Discharge: 2021-09-15 | Payer: PRIVATE HEALTH INSURANCE

## 2021-09-15 ENCOUNTER — Ambulatory Visit: Admit: 2021-09-15 | Discharge: 2021-09-15 | Payer: MEDICARE

## 2021-09-15 DIAGNOSIS — R42 Dizziness and giddiness: Secondary | ICD-10-CM

## 2021-09-15 DIAGNOSIS — F5221 Male erectile disorder: Secondary | ICD-10-CM

## 2021-09-15 DIAGNOSIS — E039 Hypothyroidism, unspecified: Secondary | ICD-10-CM

## 2021-09-15 DIAGNOSIS — Z136 Encounter for screening for cardiovascular disorders: Secondary | ICD-10-CM

## 2021-09-15 DIAGNOSIS — B079 Viral wart, unspecified: Secondary | ICD-10-CM

## 2021-09-15 DIAGNOSIS — Z7901 Long term (current) use of anticoagulants: Secondary | ICD-10-CM

## 2021-09-15 DIAGNOSIS — D489 Neoplasm of uncertain behavior, unspecified: Secondary | ICD-10-CM

## 2021-09-15 DIAGNOSIS — I480 Paroxysmal atrial fibrillation: Secondary | ICD-10-CM

## 2021-09-15 DIAGNOSIS — M25569 Pain in unspecified knee: Secondary | ICD-10-CM

## 2021-09-15 DIAGNOSIS — I4891 Unspecified atrial fibrillation: Secondary | ICD-10-CM

## 2021-09-15 DIAGNOSIS — I2510 Atherosclerotic heart disease of native coronary artery without angina pectoris: Secondary | ICD-10-CM

## 2021-09-15 DIAGNOSIS — R001 Bradycardia, unspecified: Secondary | ICD-10-CM

## 2021-09-15 DIAGNOSIS — E7800 Pure hypercholesterolemia, unspecified: Secondary | ICD-10-CM

## 2021-09-15 DIAGNOSIS — H9190 Unspecified hearing loss, unspecified ear: Secondary | ICD-10-CM

## 2021-09-15 DIAGNOSIS — R5383 Other fatigue: Secondary | ICD-10-CM

## 2021-09-15 DIAGNOSIS — J302 Other seasonal allergic rhinitis: Secondary | ICD-10-CM

## 2021-09-15 DIAGNOSIS — I495 Sick sinus syndrome: Secondary | ICD-10-CM

## 2021-09-15 DIAGNOSIS — L570 Actinic keratosis: Secondary | ICD-10-CM

## 2021-09-15 DIAGNOSIS — L821 Other seborrheic keratosis: Secondary | ICD-10-CM

## 2021-09-15 DIAGNOSIS — L578 Other skin changes due to chronic exposure to nonionizing radiation: Secondary | ICD-10-CM

## 2021-09-15 DIAGNOSIS — B353 Tinea pedis: Secondary | ICD-10-CM

## 2021-09-15 DIAGNOSIS — B351 Tinea unguium: Secondary | ICD-10-CM

## 2021-09-15 DIAGNOSIS — K219 Gastro-esophageal reflux disease without esophagitis: Secondary | ICD-10-CM

## 2021-09-15 DIAGNOSIS — E785 Hyperlipidemia, unspecified: Secondary | ICD-10-CM

## 2021-09-15 DIAGNOSIS — I10 Essential (primary) hypertension: Secondary | ICD-10-CM

## 2021-09-15 DIAGNOSIS — R519 Generalized headaches: Secondary | ICD-10-CM

## 2021-09-15 DIAGNOSIS — L304 Erythema intertrigo: Secondary | ICD-10-CM

## 2021-09-15 DIAGNOSIS — M1990 Unspecified osteoarthritis, unspecified site: Secondary | ICD-10-CM

## 2021-09-15 DIAGNOSIS — Z950 Presence of cardiac pacemaker: Secondary | ICD-10-CM

## 2021-09-15 DIAGNOSIS — Z823 Family history of stroke: Secondary | ICD-10-CM

## 2021-09-15 DIAGNOSIS — I5032 Chronic diastolic (congestive) heart failure: Secondary | ICD-10-CM

## 2021-09-15 LAB — CBC AND DIFF
RBC COUNT: 4.7 M/UL (ref 4.4–5.5)
WBC COUNT: 7.2 K/UL (ref 4.5–11.0)

## 2021-09-15 LAB — LIPID PROFILE
CHOLESTEROL: 168 mg/dL (ref <200)
LDL: 96 mg/dL (ref <100)
NON HDL CHOLESTEROL: 109 mg/dL — ABNORMAL LOW (ref 3.5–5.0)
TRIGLYCERIDES: 81 mg/dL (ref <150)
VLDL: 16 mg/dL (ref 0.3–1.2)

## 2021-09-15 LAB — COMPREHENSIVE METABOLIC PANEL
POTASSIUM: 4.6 MMOL/L (ref 3.5–5.1)
SODIUM: 142 MMOL/L (ref 137–147)

## 2021-09-15 LAB — PROTIME INR (PT)
INR: 1.4 mg/dL — ABNORMAL HIGH (ref 0.8–1.2)
PROTIME: 15 s — ABNORMAL HIGH (ref 9.5–14.2)

## 2021-09-15 LAB — TSH WITH FREE T4 REFLEX: TSH: 0.9 uU/mL (ref >40)

## 2021-09-15 NOTE — Progress Notes
LMOM for patient to take Warfarin 4mg  daily and recheck INR on Thursday so we can decide if we need to increase Thursday's dose of Warfarin.

## 2021-09-15 NOTE — Progress Notes
Date of Service: 09/15/2021    Jonathan Humphrey is a 87 y.o. male.       HPI    Jonathan Humphrey comes for followup. I saw him about 6 months ago. He is 87 years old.  He has a history of atrial fibrillation, previous ablation, previous stroke.  He has been on warfarin anticoagulation.  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 At the time of his surgery, he had resection of his left atrial appendage.  He has had coronary disease, previous intervention with pacemaker placement and chronic chronotropic incompetence.  He follows with Dr. [name].  He was started on sotalol earlier this year.  His heart function has been normal.  He has had some troubles with his right eye vision.  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  Fortunately, the left eye remains intact.  He has had some balance problems, but no falls.  He denies chest pain and shortness of breath.  There has been no palpitations, presyncope, syncope, TIA, stroke, and claudication.  There has been no fever, chills, or sweats.  He has not had any COVID infection.    ([id number])               Vitals:    09/15/21 1100   BP: 128/82   BP Source: Arm, Left Upper   Pulse: 63   SpO2: 97%   PainSc: Zero   Weight: 88.4 kg (194 lb 12.8 oz)   Height: 172.7 cm (5' 8)     Body mass index is 29.62 kg/m?Marland Kitchen     Past Medical History  Patient Active Problem List    Diagnosis Date Noted   ? Encounter for monitoring sotalol therapy 07/07/2021   ? Chronic heart failure with preserved ejection fraction (HCC) 07/07/2021   ? Balance problem 06/24/2021   ? 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 Head trauma, initial encounter 10/20/2018   ? Carpal tunnel syndrome of left wrist 07/05/2018   ? PTSD (post-traumatic stress disorder) 07/05/2018   ? Benign prostatic hyperplasia with lower urinary tract symptoms 06/03/2017   ? Shoulder arthritis 04/05/2017   ? Trigger middle finger of right hand 12/09/2016   ? Paroxysmal ventricular tachycardia 08/13/2016     06/07/2019 - ECHO:  Normal left ventricular size and systolic function.  LVEF 65%.  There is paradoxical septal wall motion abnormality.  Normal right ventricular size and systolic function.  Pacer lead was visualized in the right ventricle.  No hemodynamically significant valve disease.  There is moderate mitral annular calcification.  No pericardial effusion.  Mildly dilated sinus of Valsalva.  02/11/2020 - ECHO:  Normal left ventricular size. Prominent basal septum. No outflow tract obstruction. Normal systolic function. Estimated EF ~65%.  Abnormal septal wall motion consistent with right ventricular pacing.  Unable to assess diastolic function.  Normal right ventricular size and systolic function.  The left atrium is mildly dilated.  Normal right atrial size.  Device leads in right-sided chambers.  Moderate calcification of the mitral valve annulus with extension into the leaflets.  Very mild stenosis.  Mean gradient 3-4 mmHg at a heart rate of 78 bpm.  Mild regurgitation.  Aortic valve sclerosis without stenosis.  Trivial regurgitation.  Mild to moderate tricuspid valve regurgitation.  No pericardial effusion.  Estimated pulmonary artery systolic  pressure 26 mmHg.   The aortic root is dilated, measuring 3.8 cm at the sinus of Valsalva.  04/30/2021 - ECHO:  A septal wall motion abnormality is noted consistent with right ventricular pacing. ?No other regional wall motion abnormalities are identified. Overall left ventricular systolic function appears normal. The estimated left ventricular ejection fraction is 60%. Left ventricular contractility appears similar when compared with the prior echocardiogram performed on 12/04/20.   Mild basal septal hypertrophy is noted. The Doppler recording obtained from the left ventricular outflow tract has normal velocity and profile and does not suggest resting left ventricular outflow tract obstruction.  Grade II (moderate) left ventricular diastolic dysfunction. Elevated left atrial pressure.  The right ventricle is not visualized well. Limited views suggest normal right ventricular size and contractility.  Mild left atrial enlargement.  Aortic valve sclerosis without stenosis.  No pericardial effusion is seen.       ? Right inguinal hernia 12/06/2015   ? Cardiac pacemaker in situ 10/18/2015     ? 10/21/15 Med[employer]onic dual-chamber PPM implantation - Dr. Naoma Diener     ? 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 BMI 31.0-31.9,adult 08/10/2015   ? Primary osteoarthritis of left knee 06/04/2015   ? Visit for preventive health examination 09/05/2012   ? AV block, 2nd degree 07/14/2010   ? Leg cramps, sleep related 04/10/2010   ? 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 Micheline Rough: 10-20% prox-CFX, 20-30% prox-RCA  06/08/2019 - CCTA:  Extensive coronary artery calcifications making the accurate estimation of stenosis difficult.  Normal coronary artery system origins and course. Right dominant system.  The left anterior descending artery with extensive coronary artery calcification in the proximal to mid segments severe stenosis cannot be excluded. The left anterior descending artery is patent in the mid to distal segments and not well-visualized apically. Study will be assessed by FFR to evaluate hemodynamically.  The left circumflex artery has mild to moderate calcific atherosclerotic changes without obvious stenosis.  The right coronary artery has mild to moderate calcific atherosclerotic changes with mild stenosis proximally. The PDA is not visualized distally.   12/04/2020 - ECHO:  Technically difficult study; i.v. transpulmonary contrast was used to define the endocardial borders.  Normal left ventricular systolic function, estimated ejection fraction is 65%.  Abnormal septal motion consistent with right ventricular pacing.  Grade III (severe) left ventricular diastolic dysfunction. Elevated left atrial pressure.  The right ventricular size, wall thickness and systolic function are normal.  Left Atrium: Mildly dilated.  Severe mitral annular calcification, the mitral valve is thickened and calcified, mild stenosis, MG =3 mmHg, trace regurgitation.  Mild tricuspid valve regurgitation.  Estimated Peak Systolic PA Pressure 24 mmHg.         ? HLD (hyperlipidemia) 04/11/2009   ? Essential hypertension 04/11/2009   ? GERD (gastroesophageal reflux disease) 04/11/2009   ? Sensorineural hearing loss 04/11/2009   ? Erectile dysfunction of non-organic origin 04/11/2009   ? Hypothyroid 04/11/2009   ? PAF (paroxysmal atrial fibrillation) (HCC) 04/11/2009   ? Chronic anticoagulation, on warfarin 04/11/2009         Review of Systems   All other systems reviewed and are negative.  Review of systems is documented in the database.    ([id number])        Physical Exam  On examination, he is in no distress.  He is 5 foot 8, weight is 194, BMI 29.6.  Blood pressure 128/82, pulse is  regular at 60 beats per minute.  Rhythm is paced.  Saturation is 97%.  There is no cyanosis.  Venous pressure normal.  There is no edema.  Lungs are clear.  There is no wheeze or rhonchi.  PMI is not felt.  Heart sounds are normal.  I do not hear any gallops, murmurs, or rubs.  There is no hepatomegaly.  There are no abdominal bruits.  Bowel sounds are normal.  There is no icterus.  There is no focal neurologic deficit.  Distal pulses are 2+ bilaterally.  There are no carotid bruits.  He is alert and oriented x3.  His mood, judgment, and affect are normal.    ([id number])        Cardiovascular Studies  EKG shows atrial sensed, ventricular paced rhythm.  There has been no change.    ([id number])        Cardiovascular Health Factors  Vitals BP Readings from Last 3 Encounters:   09/15/21 128/82   07/30/21 128/72 07/22/21 138/80     Wt Readings from Last 3 Encounters:   09/15/21 88.4 kg (194 lb 12.8 oz)   07/30/21 86.8 kg (191 lb 6.4 oz)   07/22/21 86.3 kg (190 lb 3.2 oz)     BMI Readings from Last 3 Encounters:   09/15/21 29.62 kg/m?   07/30/21 29.10 kg/m?   07/22/21 28.93 kg/m?      Smoking Social History     Tobacco Use   Smoking Status Never Smoker   Smokeless Tobacco Never Used      Lipid Profile Cholesterol   Date Value Ref Range Status   04/30/2021 145 <200 MG/DL Final     HDL   Date Value Ref Range Status   04/30/2021 55 >40 MG/DL Final     LDL   Date Value Ref Range Status   04/30/2021 83 <100 mg/dL Final     Triglycerides   Date Value Ref Range Status   04/30/2021 95 <150 MG/DL Final      Blood Sugar Hemoglobin A1C   Date Value Ref Range Status   04/30/2021 5.6 4.0 - 6.0 % Final     Comment:     The ADA recommends that most patients with type 1 and type 2 diabetes maintain   an A1c level <7%.       Glucose   Date Value Ref Range Status   07/09/2021 103 (H) 70 - 100 MG/DL Final   21/30/8657 96 70 - 100 MG/DL Final   84/69/6295 93 70 - 100 MG/DL Final   28/41/3244 94 70 - 110 MG/DL Final   12/23/7251 664 (H) 70 - 110 MG/DL Final   40/34/7425 96 70 - 110 MG/DL Final     Glucose, POC   Date Value Ref Range Status   05/18/2010 89 70 - 100 MG/DL Final   95/63/8756 433 (H) 70 - 100 MG/DL Final   29/51/8841 660 (H) 70 - 100 MG/DL Final          Problems Addressed Today  Encounter Diagnoses   Name Primary?   ? PAF (paroxysmal atrial fibrillation) (HCC) Yes   ? Screening for heart disease    ? Coronary artery disease, non-occlusive    ? Chronic anticoagulation, on warfarin    ? Cardiac pacemaker in situ    ? Chronic heart failure with preserved ejection fraction (HCC)    ? 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 Pure hypercholesterolemia  Assessment and Plan    Mr. Montelongo has been stable from a cardiac perspective.  He follows with Dr. [name].  He is now on sotalol.  His last device check shows some nonsustained VT.  There has not been any recurrent atrial fibrillation.  He has not had symptomatic AFib.  He has had troubles with his vision.  He remains on anticoagulation.  I am not sure if he has had affective obliteration of his left atrial appendage.  We talked about that today at length.  I would like to check lab work.  I would like for him to have a transesophageal echo for further definition of the left atrium and potentially consider discontinuing the warfarin if that looks safe to consider.  I will keep you informed with the results.  He needs ongoing risk factor modification.  If I can be of further assistance, please do not hesitate to let me know.    ([id number])               Current Medications (including today's revisions)  ? aspirin EC 81 mg tablet Take 1 tablet by mouth daily. Take with food.   ? atorvastatin (LIPITOR) 40 mg tablet Take one tablet by mouth daily.   ? cholecalciferol (VITAMIN D3) 50 mcg (2,000 unit) tablet Take 2,000 Units by mouth daily.   ? coenzyme Q10 100 mg cap Take 100 mg by mouth daily.   ? cyanocobalamin (VITAMIN B-12) 1,000 mcg tablet Take one tablet by mouth daily. Start this the day after taking your one time Vitamin B12 injection.   ? duloxetine DR (CYMBALTA) 30 mg capsule Take one capsule by mouth daily.   ? ezetimibe (ZETIA) 10 mg tablet Take 1 tablet by mouth once daily  Indications: excessive fat in the blood   ? levothyroxine (SYNTHROID) 50 mcg tablet Take one tablet by mouth daily.   ? lisinopriL (ZESTRIL) 20 mg tablet Take one tablet by mouth daily.   ? OMEGA-3 FATTY ACIDS/FISH OIL (FISH OIL-OMEGA-3 FATTY ACIDS PO) Take 4 tablets by mouth daily.   ? sotaloL (BETAPACE) 120 mg tablet Take one tablet by mouth twice daily.   ? warfarin (COUMADIN) 4 mg tablet Take 1 tablet daily or as instructed by Dr. Vivianne Spence (Patient taking differently: at bedtime daily. Take 1 tablet daily or as instructed by Dr. Vivianne Spence)

## 2021-09-15 NOTE — Progress Notes
From: Henrene Hawking, MD  Sent: 09/15/2021   2:07 PM CDT  To: [facility] Nurse Gen Card Team [facility]    Nyajah Hyson-labs reviewed.  Please add PCSK9/Repatha.  Please repeat the labs and lipids in 3 months time.  Thanks RG

## 2021-09-15 NOTE — Telephone Encounter
From: Henrene Hawking, MD  Sent: 09/15/2021   2:07 PM CDT  To: [facility] Nurse Gen Card Team [facility]     Tacara Hadlock-labs reviewed.  Please add PCSK9/Repatha.  Please repeat the labs and lipids in 3 months time.  Thanks RG

## 2021-09-15 NOTE — Patient Instructions
We would like to see you back in the office for a follow up appointment based off your TEE results    Have lab work drawn today. It is ok if you have eaten today     Please send a MyChart message or call the Cardiology G[facility] Team with questions, [phone], Demetric Parslow RN, Caryl Pina RN, Heather RN, Tami RN, Donata Clay RN, Sharyn Lull, RN, [facility] LPN    You may receive test results in [place] before the ordering provider has reviewed them. Our care team will follow up with you after reviewing the tests to discuss your care. This may take up to 5-7 business days if results are not urgently needing to be addressed. Thank you for your patience.      [facility] Team Fax number: [phone]  Cardiology Scheduling number: [phone]  On-Call/After-Hours Cardiology number: [phone]

## 2021-09-16 ENCOUNTER — Encounter: Admit: 2021-09-16 | Discharge: 2021-09-16 | Payer: PRIVATE HEALTH INSURANCE

## 2021-09-17 ENCOUNTER — Encounter: Admit: 2021-09-17 | Discharge: 2021-09-17 | Payer: PRIVATE HEALTH INSURANCE

## 2021-09-17 NOTE — Progress Notes
The Prior Authorization for repatha has been submitted for Jonathan Humphrey via Cover My Meds.  Will continue to follow.    [place] Patient Advocate  [phone]

## 2021-09-18 ENCOUNTER — Encounter: Admit: 2021-09-18 | Discharge: 2021-09-18 | Payer: PRIVATE HEALTH INSURANCE

## 2021-09-18 NOTE — Telephone Encounter
I spoke to patient and confirmed 10/02/21 with 11:00am arrival time. Pt denies any issues with swallowing or feeling like food gets caught in his throat when eating. I let patient know I would send him additional information via mychart.

## 2021-09-18 NOTE — Telephone Encounter
Attempted to reach procedural team to determine next available date for TEE. Will try again

## 2021-09-18 NOTE — Telephone Encounter
-----   Message from Carin Primrose, RN sent at 09/16/2021  2:05 PM CDT -----  Regarding: Needs TEE  TEE is to ensure pt has had effective obliteration of his left atrial appendage.  ----- Message -----  From: Carin Primrose, RN  Sent: 09/16/2021  10:30 AM CDT  To: [facility] Nurse Gen Card Team [facility]  Subject: Needs TEE Scheduled                              Patient spoke to wife, he will call back as they plan on hiring a friend locally to bring him for his procedure. He will call back.   ----- Message -----  From: Carin Primrose, RN  Sent: 09/15/2021  11:55 AM CDT  To: [facility] Nurse Gen Card Team Gold  Subject: Needs TEE                                        Pt to call back when he speaks to his wife re: dates that would work. Then we need to call procedural nurses about date before 30 day H&P is needed

## 2021-09-18 NOTE — Telephone Encounter
I spoke to procedural area and they are able to move patient to 10/13 at noon with 11:00am arrival time.

## 2021-09-18 NOTE — Telephone Encounter
I spoke to patient and wife, they will call transportation company back to confirm 10/11 is available for the transportation services.

## 2021-09-18 NOTE — Telephone Encounter
I discussed with procedural team and patient can have TEE procedure on 10/11 at 2pm with a 1pm arrival time. Request entered.

## 2021-09-18 NOTE — Telephone Encounter
I spoke to patient and wife, they will use public transportation service that their county provides. Wife confirmed that she will accompany patient. I let her know that if she decides she cannot accompany patient that we will need to be notified to determine alternative plan for safe DC from our clinic setting after sedation. She is agreeable to plan.

## 2021-09-19 ENCOUNTER — Encounter: Admit: 2021-09-19 | Discharge: 2021-09-19 | Payer: PRIVATE HEALTH INSURANCE

## 2021-09-23 ENCOUNTER — Encounter: Admit: 2021-09-23 | Discharge: 2021-09-23 | Payer: PRIVATE HEALTH INSURANCE

## 2021-09-23 NOTE — Progress Notes
In range so no call placed.

## 2021-09-29 ENCOUNTER — Encounter: Admit: 2021-09-29 | Discharge: 2021-09-29

## 2021-09-29 NOTE — Progress Notes
The Prior Authorization for repatha was approved for [employer] from 09/17/2021 to 03/17/2022.  The copay is $141.00.  The PA authorization number is [id number].    Patient will call back on Thursday after doctors appt.   [place] Patient Advocate  [phone]

## 2021-09-30 ENCOUNTER — Encounter: Admit: 2021-09-30 | Discharge: 2021-09-30

## 2021-09-30 NOTE — Telephone Encounter
Called pt regarding TEE on Thursday at 1200.  No answer.  LVM to call us back at [phone]. We are here until 5pm each day.  RK

## 2021-10-02 ENCOUNTER — Encounter: Admit: 2021-10-02 | Discharge: 2021-10-02

## 2021-10-02 ENCOUNTER — Ambulatory Visit: Admit: 2021-10-02 | Discharge: 2021-10-02

## 2021-10-02 ENCOUNTER — Ambulatory Visit: Admit: 2021-10-02 | Discharge: 2021-10-02 | Payer: MEDICARE

## 2021-10-02 DIAGNOSIS — B079 Viral wart, unspecified: Secondary | ICD-10-CM

## 2021-10-02 DIAGNOSIS — E785 Hyperlipidemia, unspecified: Secondary | ICD-10-CM

## 2021-10-02 DIAGNOSIS — H9190 Unspecified hearing loss, unspecified ear: Secondary | ICD-10-CM

## 2021-10-02 DIAGNOSIS — I495 Sick sinus syndrome: Secondary | ICD-10-CM

## 2021-10-02 DIAGNOSIS — I480 Paroxysmal atrial fibrillation: Secondary | ICD-10-CM

## 2021-10-02 DIAGNOSIS — Z823 Family history of stroke: Secondary | ICD-10-CM

## 2021-10-02 DIAGNOSIS — R42 Dizziness and giddiness: Secondary | ICD-10-CM

## 2021-10-02 DIAGNOSIS — R5383 Other fatigue: Secondary | ICD-10-CM

## 2021-10-02 DIAGNOSIS — L570 Actinic keratosis: Secondary | ICD-10-CM

## 2021-10-02 DIAGNOSIS — L821 Other seborrheic keratosis: Secondary | ICD-10-CM

## 2021-10-02 DIAGNOSIS — B351 Tinea unguium: Secondary | ICD-10-CM

## 2021-10-02 DIAGNOSIS — L578 Other skin changes due to chronic exposure to nonionizing radiation: Secondary | ICD-10-CM

## 2021-10-02 DIAGNOSIS — B353 Tinea pedis: Secondary | ICD-10-CM

## 2021-10-02 DIAGNOSIS — D489 Neoplasm of uncertain behavior, unspecified: Secondary | ICD-10-CM

## 2021-10-02 DIAGNOSIS — J302 Other seasonal allergic rhinitis: Secondary | ICD-10-CM

## 2021-10-02 DIAGNOSIS — L304 Erythema intertrigo: Secondary | ICD-10-CM

## 2021-10-02 DIAGNOSIS — M25569 Pain in unspecified knee: Secondary | ICD-10-CM

## 2021-10-02 DIAGNOSIS — M1990 Unspecified osteoarthritis, unspecified site: Secondary | ICD-10-CM

## 2021-10-02 DIAGNOSIS — F5221 Male erectile disorder: Secondary | ICD-10-CM

## 2021-10-02 DIAGNOSIS — R001 Bradycardia, unspecified: Secondary | ICD-10-CM

## 2021-10-02 DIAGNOSIS — I4891 Unspecified atrial fibrillation: Secondary | ICD-10-CM

## 2021-10-02 DIAGNOSIS — Z7901 Long term (current) use of anticoagulants: Secondary | ICD-10-CM

## 2021-10-02 DIAGNOSIS — R519 Generalized headaches: Secondary | ICD-10-CM

## 2021-10-02 DIAGNOSIS — E039 Hypothyroidism, unspecified: Secondary | ICD-10-CM

## 2021-10-02 DIAGNOSIS — I10 Essential (primary) hypertension: Secondary | ICD-10-CM

## 2021-10-02 DIAGNOSIS — K219 Gastro-esophageal reflux disease without esophagitis: Secondary | ICD-10-CM

## 2021-10-02 LAB — POC PT/INR: INR POC: 1.8 — ABNORMAL HIGH (ref 0.8–1.2)

## 2021-10-02 MED ORDER — PROPOFOL 10 MG/ML IV EMUL 20 ML (INFUSION)(AM)(OR)
INTRAVENOUS | 0 refills | Status: DC
Start: 2021-10-02 — End: 2021-10-02
  Administered 2021-10-02: 17:00:00 140 ug/kg/min via INTRAVENOUS

## 2021-10-02 MED ORDER — SODIUM CHLORIDE 0.9 % IV SOLP (OR) 500ML
INTRAVENOUS | 0 refills | Status: DC
Start: 2021-10-02 — End: 2021-10-02
  Administered 2021-10-02: 17:00:00 via INTRAVENOUS

## 2021-10-02 MED ORDER — LIDOCAINE (PF) 20 MG/ML (2 %) IJ SOLN
INTRAVENOUS | 0 refills | Status: DC
Start: 2021-10-02 — End: 2021-10-02
  Administered 2021-10-02: 17:00:00 80 mg via INTRAVENOUS

## 2021-10-02 MED ORDER — PROPOFOL INJ 10 MG/ML IV VIAL
INTRAVENOUS | 0 refills | Status: DC
Start: 2021-10-02 — End: 2021-10-02
  Administered 2021-10-02: 17:00:00 70 mg via INTRAVENOUS

## 2021-10-02 NOTE — Progress Notes
Care Plan   Care Category & Patient Outcome Goal Met Treatment/  Interventions Plan of the Day RN Name   Cardiovascular  Hemodynamic stability and adequate peripheral perfusion.   Yes   Refer to  patient's chart. Monitor VS per sedation standard. Monitor ECG continuously.  Assess/maintain IV patency.   Rodel Glaspy Alcantar-Velazquez, RN   Respiratory  Patent airway, ease of respiration, and adequate oxygenation.   Yes   Refer to  patient's chart. Maintain open airway. Assess respirations. Monitor O2 saturations. Titrate O2 to keep sat>= 95% or baseline.   Khira Cudmore Alcantar-Velazquez, RN   Psychological/Emotional/Spiritual  Cope with procedure with support in place.  Spiritual needs are addressed.     Yes   Refer to  patient's chart. Provide adequate and thorough instructions.  Provide a caring and supportive environment.  Communicate patients concerns with other members of the health team.   Thursa Emme Alcantar-Velazquez, RN   Pain  Patients pain goal met.   Yes   Refer to  patient's chart. Prepare patient for potentially uncomfortable procedure.  Observe for verbal/nonverbal complaints of pain.  Assess pain.  Provide comfort measures.   Evett Kassa Alcantar-Velazquez, RN   Safety/Fall Risk  Free from injury, security maintained.   Yes High Risk    Refer to  patient's chart.   Follow nursing standard of practice for high risks fall patients.   Jaquavius Hudler Alcantar-Velazquez, RN   Knowledge Base  Verbalize understanding of procedure/information provided.   Yes   Refer to  patient's chart. Describe the procedure along with what symptoms to expect.  Evaluate patients understanding of procedure.  Encourage patient to ask questions.  Provide additional information as needed.   Gamal Todisco Alcantar-Velazquez, RN

## 2021-10-02 NOTE — Progress Notes
Pre-Operative Assessment for TEE or Cardioversion    Date of Service:  10/02/2021    Jonathan Humphrey is a 87 y.o. y.o. male. With significant HTN, resection of aortic valve mass, PTSD, CHF, who is referred for TEE Indication: LAA obiliteration eval .      he has been compliant with his  warfarin Missed dose: Noand ... bleeding. he is Positive for: Hx. Head/Neck surgery/radiation and Hx. Chest surgery/radiation and Positive for: ETOH one drink/day.      GI procedures:Colonoscopy            When: When/Date: unknown    Chest pain:  No   SOB: No         Medical History:  Medical History:   Diagnosis Date   ? AK (actinic keratosis)     scalp   ? Arthritis    ? Atrial fibrillation (HCC) 01/16/2009   ? Chronic anticoagulation 01/16/2009   ? Coronary atherosclerosis 09/01/2006    Coronary artery disease   ? Cyst     right upper back   ? Dizziness 01/19/2007   ? 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 Essential hypertension 04/11/2009   ? Family history of cerebrovascular accident (CVA) 01/19/2007   ? Fatigue 07/21/2007   ? Generalized headaches    ? GERD (gastroesophageal reflux disease) 12/30/2006   ? Hearing loss 01/19/2007   ? HLD (hyperlipidemia) 04/11/2009   ? Hyperlipidemia 09/01/2006   ? Hypertension, essential 09/01/2006   ? Hypothyroidism 11/30/2007   ? Intertrigo    ? Knee pain 12/30/2006   ? Neoplasm of uncertain behavior    ? Onychomycosis    ? PAF (paroxysmal atrial fibrillation) (HCC) 04/11/2009   ? Photoaging of skin    ? Seasonal allergic reaction    ? Sinus bradycardia    ? SK (seborrheic keratosis)     face, scalp, right ear   ? SSS (sick sinus syndrome) (HCC)    ? Tinea pedis    ? Verruca vulgaris         Surgical History:   Surgical History:   Procedure Laterality Date   ? LEFT HEART CATHETERIZATION  05/1994    no stents   ? CORONARY ANGIOPLASTY  05/1994    [facility], no stents   ? SKIN BIOPSY  09/24/2006    shave biopsy   ? CARDIOVERSION  03/11/2009   ? HEART VALVE SURGERY  2012 tumor on aortic valve   ? KNEE REPLACEMENT Right 10/09/14   ? Insert Permanent Pacemaker and Atrial and Ventricular Leads Left 10/21/2015    Performed by Smith Robert, MD at [facility] EP LAB   ? Fluoroscopy N/A 10/21/2015    Performed by Smith Robert, MD at [facility] EP LAB   ? REPAIR HERNIA UMBILICAL N/A 01/13/2016    Performed by Simonne Martinet, MD at [facility] OR   ? REPAIR HERNIA INGUINAL Bilateral 01/13/2016    Performed by Simonne Martinet, MD at [facility] OR   ? SKIN LESION REMOVAL  2018    3 spots on face by outside facility   ? ANGIOGRAPHY CORONARY ARTERY N/A 05/10/2017    Performed by Greig Castilla, MD at [facility] CATH LAB   ? POSSIBLE PERCUTANEOUS CORONARY STENT PLACEMENT WITH ANGIOPLASTY N/A 05/10/2017    Performed by Greig Castilla, MD at [facility] CATH LAB   ? ANGIOGRAPHY CORONARY ARTERY WITH LEFT HEART CATHETERIZATION N/A 07/05/2019    Performed by Harley Alto, MD at [facility] CATH LAB   ?  POSSIBLE PERCUTANEOUS CORONARY STENT PLACEMENT WITH ANGIOPLASTY N/A 07/05/2019    Performed by Harley Alto, MD at [facility] CATH LAB       Social History     Social History     Tobacco Use   ? Smoking status: Never Smoker   ? Smokeless tobacco: Never Used   Vaping Use   ? Vaping Use: Never used   Substance Use Topics   ? Alcohol use: Yes     Alcohol/week: 7.0 standard drinks     Types: 7 Shots of liquor per week     Comment: 2-3 ounces daily   ? Drug use: No         Allergies                                        No Known Allergies       Current Medications  Current Outpatient Medications on File Prior to Encounter   Medication Sig Dispense Refill   ? aspirin EC 81 mg tablet Take 1 tablet by mouth daily. Take with food. 90 tablet 3   ? atorvastatin (LIPITOR) 40 mg tablet Take one tablet by mouth daily. 90 tablet 3   ? cholecalciferol (VITAMIN D3) 50 mcg (2,000 unit) tablet Take 2,000 Units by mouth daily.     ? coenzyme Q10 100 mg cap Take 100 mg by mouth daily.     ? cyanocobalamin (VITAMIN B-12) 1,000 mcg tablet Take one tablet by mouth daily. Start this the day after taking your one time Vitamin B12 injection. 90 tablet 3   ? duloxetine DR (CYMBALTA) 30 mg capsule Take one capsule by mouth daily. 90 capsule 0   ? evolocumab (REPATHA SURECLICK) 140 mg/mL injectable PEN Inject 1 mL under the skin every 14 days. 6 mL 3   ? ezetimibe (ZETIA) 10 mg tablet Take 1 tablet by mouth once daily  Indications: excessive fat in the blood 90 tablet 3   ? levothyroxine (SYNTHROID) 50 mcg tablet Take one tablet by mouth daily. 90 tablet 3   ? lisinopriL (ZESTRIL) 20 mg tablet Take one tablet by mouth daily. 90 tablet 3   ? OMEGA-3 FATTY ACIDS/FISH OIL (FISH OIL-OMEGA-3 FATTY ACIDS PO) Take 4 tablets by mouth daily.     ? sotaloL (BETAPACE) 120 mg tablet Take one tablet by mouth twice daily. 180 tablet 1   ? warfarin (COUMADIN) 4 mg tablet Take 1 tablet daily or as instructed by Dr. Vivianne Spence (Patient taking differently: at bedtime daily. Take 1 tablet daily or as instructed by Dr. Vivianne Spence) 90 tablet 3     No current facility-administered medications on file prior to encounter.       Vitals  Estimated body mass index is 29.19 kg/m? as calculated from the following:    Height as of this encounter: 172.7 cm (5' 8).    Weight as of this encounter: 87.1 kg (192 lb).       Patient appears alert and oriented: Yes  NPO: for greater than 8 hours  Inpatient IV status: Right forearm    Diagnostic Tests  White Blood Cells   Date Value Ref Range Status   09/15/2021 7.2 4.5 - 11.0 K/UL Final     Hemoglobin   Date Value Ref Range Status   09/15/2021 16.2 13.5 - 16.5 GM/DL Final     Hematocrit   Date Value Ref Range Status  09/15/2021 47.0 40 - 50 % Final     Platelet Count   Date Value Ref Range Status   09/15/2021 205 150 - 400 K/UL Final     Sodium   Date Value Ref Range Status   09/15/2021 142 137 - 147 MMOL/L Final     Potassium   Date Value Ref Range Status   09/15/2021 4.6 3.5 - 5.1 MMOL/L Final     Magnesium   Date Value Ref Range Status   07/09/2021 2.0 1.6 - 2.6 mg/dL Final     Blood Urea Nitrogen Date Value Ref Range Status   09/15/2021 19 7 - 25 MG/DL Final     Creatinine   Date Value Ref Range Status   09/15/2021 0.78 0.4 - 1.24 MG/DL Final     Glucose   Date Value Ref Range Status   09/15/2021 96 70 - 100 MG/DL Final   96/29/5284 94 70 - 110 MG/DL Final       Last MAC INR Flow Sheet Entry:    Last recorded Lab results:   INR Home   Date Value Ref Range Status   09/23/2021 2.4 2 - 3 Final   09/19/2021 1.8 (A) 2 - 3 Final   08/30/2021 2.1  Final   08/06/2021 2.6     07/24/2021 2.2  Final   07/07/2021 2.5  Final     INR   Date Value Ref Range Status   09/15/2021 1.4 (H) 0.8 - 1.2 Final   07/09/2021 2.2 (H) 0.8 - 1.2 Final   07/08/2021 3.0 (H) 0.8 - 1.2 Final   07/07/2021 2.6 (H) 0.8 - 1.2 Final     APTT   Date Value Ref Range Status   06/07/2019 41.3 (H) 24.0 - 36.5 SEC Final           Blood Cultures  Resulted Micro Last 72 Hrs    No results found         Last TEE date: None  Last Cardioversion date: None  Echo procedures within the past 30 days:  No results found.      Device Information on File  Lab Results   Component Value Date/Time    GENERATOR [employer] 08/13/2021 11:03 AM    EPDEVTYP IPG 08/13/2021 11:03 AM         Additional Comments:  None    Plan:  Dr. Zachery Dakins will plan to proceed with the  TEE.

## 2021-10-02 NOTE — Anesthesia Post-Procedure Evaluation
Post-Anesthesia Evaluation    Name: Jonathan Humphrey      MRN: [id number]     DOB: January 15, 1934     Age: 87 y.o.     Sex: male   __________________________________________________________________________     Procedure Information     Anesthesia Start Date/Time: 10/02/21 1210    Procedure: TRANSESOPHAGEAL ECHO    Location: Cardiology:[facility]          Post-Anesthesia Vitals  BP: 109/72 (10/13 1250)  Pulse: 65 (10/13 1250)  Respirations: 17 PER MINUTE (10/13 1250)  SpO2: 94 % (10/13 1250)  O2 Device: Other (Comment) (10/13 1250)   Vitals Value Taken Time   BP 109/72 10/02/21 1250   Temp     Pulse 65 10/02/21 1250   Respirations 17 PER MINUTE 10/02/21 1250   SpO2 94 % 10/02/21 1250   O2 Device Other (Comment) 10/02/21 1250   ABP     ART BP           Post Anesthesia Evaluation Note    Evaluation location: Pre/Post  Patient participation: recovered; patient participated in evaluation  Level of consciousness: alert    Pain score: 1  Pain management: adequate    Hydration: normovolemia  Temperature: 36.0C - 38.4C  Airway patency: adequate    Perioperative Events       Post-op nausea and vomiting: no PONV    Postoperative Status  Cardiovascular status: hemodynamically stable  Respiratory status: spontaneous ventilation  Follow-up needed: none        Perioperative Events  There were no known complications for this encounter.

## 2021-10-03 ENCOUNTER — Encounter: Admit: 2021-10-03 | Discharge: 2021-10-03

## 2021-10-03 DIAGNOSIS — I2510 Atherosclerotic heart disease of native coronary artery without angina pectoris: Secondary | ICD-10-CM

## 2021-10-03 DIAGNOSIS — E7800 Pure hypercholesterolemia, unspecified: Secondary | ICD-10-CM

## 2021-10-09 ENCOUNTER — Encounter: Admit: 2021-10-09 | Discharge: 2021-10-09

## 2021-10-10 ENCOUNTER — Encounter: Admit: 2021-10-10 | Discharge: 2021-10-10

## 2021-10-10 MED ORDER — REPATHA SURECLICK 140 MG/ML SC PNIJ
140 mg | SUBCUTANEOUS | 3 refills | 28.00000 days | Status: AC
Start: 2021-10-10 — End: ?

## 2021-10-10 MED ORDER — REPATHA SURECLICK 140 MG/ML SC PNIJ
140 mg | SUBCUTANEOUS | 3 refills | 28.00000 days | Status: DC
Start: 2021-10-10 — End: 2021-10-10

## 2021-10-10 NOTE — Telephone Encounter
Pt LM stating he needs his Repatha script re-sent to [facility]. He initially declined it through them thinking he'd rather get it through his mail order, but now he's changing his mind so he just needs the script resent to [facility].    Called pt back to let him know it'd been resent. He thanked me for the assistance.

## 2021-10-10 NOTE — Telephone Encounter
Jonathan Humphrey requesting return call to discuss PCSK9i. He states he can get either Praluent or Repatha from [facility] in [place] for $131 (vs $141 at [facility]). He wanted to verify that REG didn't prefer one to the other. Confirmed that REG doesn't have a preference, but usually insurance does. He requests we transfer Repatha Rx to [facility] in [place], [place]. Encouraged him to reach out if Rx becomes to costly. He states understanding and appreciates the communication.

## 2021-10-17 ENCOUNTER — Encounter: Admit: 2021-10-17 | Discharge: 2021-10-17

## 2021-10-17 MED ORDER — DULOXETINE 30 MG PO CPDR
ORAL_CAPSULE | Freq: Every day | ORAL | 0 refills | 60.00000 days | Status: AC
Start: 2021-10-17 — End: ?

## 2021-10-17 NOTE — Telephone Encounter
Requested Prescriptions   Pending Prescriptions Disp Refills    duloxetine DR (CYMBALTA) 30 mg capsule [Pharmacy Med Name: DULOXETINE 30MG ] 90 capsule 0     Sig: TAKE ONE CAPSULE BY MOUTH ONCE DAILY       Psychiatry: Antidepressants - SNRIs & Bupropion Passed - 10/17/2021 12:31 PM        Passed - Depression Screening completed within the last 6 months        Passed - Valid encounter within last 6 months     Recent Visits  Date Type Provider Dept   07/22/21 Office Visit Al-Hihi, Almetta Lovely, MD [facility]   06/24/21 Office Visit Claire Shown, DO [facility]   12/24/20 Office Visit Al-Hihi, Almetta Lovely, MD [facility]   08/22/20 Office Visit Telehealth Al-Hihi, Almetta Lovely, MD [facility]   07/09/20 Office Visit Al-Hihi, Almetta Lovely, MD [facility]   Showing recent visits within past 720 days and meeting all other requirements  Future Appointments  Date Type Provider Dept   12/23/21 Appointment Al-Hihi, Almetta Lovely, MD [facility]   01/27/22 Appointment Al-Hihi, Almetta Lovely, MD [facility]   Showing future appointments within next 360 days and meeting all other requirements            Passed - BP completed in the last 6 months     BP Readings from Last 2 Encounters:   10/02/21 135/79   09/15/21 128/82

## 2021-10-17 NOTE — Telephone Encounter
10-17-21  Internal referral.  Previously seen by CVM.  No records requested.  tlv

## 2021-10-23 ENCOUNTER — Encounter: Admit: 2021-10-23 | Discharge: 2021-10-23

## 2021-10-23 NOTE — Telephone Encounter
Jonathan Humphrey called and LM requesting a call back for clarification on the frequency of his Repatha injections.    Returned call and spoke with [employer]. I confirmed with him that the injections should be every 14 days. He verbalized understanding and had no further questions or concerns at this time.

## 2021-10-29 ENCOUNTER — Encounter: Admit: 2021-10-29 | Discharge: 2021-10-29

## 2021-10-29 NOTE — Telephone Encounter
Received fax from [facility] stating pt was overdue on his INR. Per chart review, warfarin was discontinued after TEE on 10/13. Called [employer] to update them.

## 2021-10-30 ENCOUNTER — Encounter: Admit: 2021-10-30 | Discharge: 2021-10-30

## 2021-10-30 ENCOUNTER — Ambulatory Visit: Admit: 2021-10-30 | Discharge: 2021-10-30

## 2021-10-30 ENCOUNTER — Ambulatory Visit: Admit: 2021-10-30 | Discharge: 2021-10-30 | Payer: MEDICARE

## 2021-10-30 DIAGNOSIS — B351 Tinea unguium: Secondary | ICD-10-CM

## 2021-10-30 DIAGNOSIS — M25569 Pain in unspecified knee: Secondary | ICD-10-CM

## 2021-10-30 DIAGNOSIS — J302 Other seasonal allergic rhinitis: Secondary | ICD-10-CM

## 2021-10-30 DIAGNOSIS — L304 Erythema intertrigo: Secondary | ICD-10-CM

## 2021-10-30 DIAGNOSIS — I10 Essential (primary) hypertension: Secondary | ICD-10-CM

## 2021-10-30 DIAGNOSIS — L578 Other skin changes due to chronic exposure to nonionizing radiation: Secondary | ICD-10-CM

## 2021-10-30 DIAGNOSIS — L821 Other seborrheic keratosis: Secondary | ICD-10-CM

## 2021-10-30 DIAGNOSIS — F5221 Male erectile disorder: Secondary | ICD-10-CM

## 2021-10-30 DIAGNOSIS — R5383 Other fatigue: Secondary | ICD-10-CM

## 2021-10-30 DIAGNOSIS — I480 Paroxysmal atrial fibrillation: Secondary | ICD-10-CM

## 2021-10-30 DIAGNOSIS — K219 Gastro-esophageal reflux disease without esophagitis: Secondary | ICD-10-CM

## 2021-10-30 DIAGNOSIS — Z950 Presence of cardiac pacemaker: Secondary | ICD-10-CM

## 2021-10-30 DIAGNOSIS — E7800 Pure hypercholesterolemia, unspecified: Secondary | ICD-10-CM

## 2021-10-30 DIAGNOSIS — B079 Viral wart, unspecified: Secondary | ICD-10-CM

## 2021-10-30 DIAGNOSIS — I495 Sick sinus syndrome: Secondary | ICD-10-CM

## 2021-10-30 DIAGNOSIS — R519 Generalized headaches: Secondary | ICD-10-CM

## 2021-10-30 DIAGNOSIS — R001 Bradycardia, unspecified: Secondary | ICD-10-CM

## 2021-10-30 DIAGNOSIS — L570 Actinic keratosis: Secondary | ICD-10-CM

## 2021-10-30 DIAGNOSIS — M1990 Unspecified osteoarthritis, unspecified site: Secondary | ICD-10-CM

## 2021-10-30 DIAGNOSIS — R7303 Prediabetes: Secondary | ICD-10-CM

## 2021-10-30 DIAGNOSIS — D489 Neoplasm of uncertain behavior, unspecified: Secondary | ICD-10-CM

## 2021-10-30 DIAGNOSIS — B353 Tinea pedis: Secondary | ICD-10-CM

## 2021-10-30 DIAGNOSIS — Z79899 Other long term (current) drug therapy: Secondary | ICD-10-CM

## 2021-10-30 DIAGNOSIS — I2510 Atherosclerotic heart disease of native coronary artery without angina pectoris: Secondary | ICD-10-CM

## 2021-10-30 DIAGNOSIS — Z823 Family history of stroke: Secondary | ICD-10-CM

## 2021-10-30 DIAGNOSIS — E039 Hypothyroidism, unspecified: Secondary | ICD-10-CM

## 2021-10-30 DIAGNOSIS — H9190 Unspecified hearing loss, unspecified ear: Secondary | ICD-10-CM

## 2021-10-30 DIAGNOSIS — R42 Dizziness and giddiness: Secondary | ICD-10-CM

## 2021-10-30 DIAGNOSIS — I4891 Unspecified atrial fibrillation: Secondary | ICD-10-CM

## 2021-10-30 DIAGNOSIS — E785 Hyperlipidemia, unspecified: Secondary | ICD-10-CM

## 2021-10-30 DIAGNOSIS — Z7901 Long term (current) use of anticoagulants: Secondary | ICD-10-CM

## 2021-10-30 LAB — ALT (SGPT): ALT: 40 U/L (ref 7–56)

## 2021-10-30 LAB — HEMOGLOBIN A1C: HEMOGLOBIN A1C: 5.8 % (ref >40)

## 2021-10-30 LAB — LIPID PROFILE
CHOLESTEROL: 79 mg/dL (ref <200)
LDL: 9 mg/dL (ref <100)
TRIGLYCERIDES: 90 mg/dL (ref <150)
VLDL: 18 mg/dL

## 2021-10-30 LAB — GLUCOSE, FASTING: GLUCOSE, FASTING: 100 mg/dL (ref 70–100)

## 2021-10-30 LAB — AST (SGOT): AST: 30 U/L (ref 7–40)

## 2021-10-30 NOTE — Progress Notes
Date of Service: 10/30/2021    Jonathan Humphrey is a 87 y.o. male.       HPI     He is a pleasant 87 year old male being seen for risk factor reduction.  He is followed by Dr. Vivianne Spence and Alto.  He has a history of atrial fibrillation with previous ablation, CVA, aortic valve mass resection with left atrial appendage ligation.  Previous PCI and pacemaker placement.  He had been on warfarin therapy that was discontinued recently.  He saw Dr. Vivianne Spence in September.  A lipid panel showed LDL had increased to a suboptimal level of 96.  In the past it has fluctuated between 58 and 83.  He was started on Repatha in addition to his already taking atorvastatin 40 mg and Zetia 10 mg.  He has had 3 injections.  His last injection was approximately 1 week ago.  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  His weight has increased.  He is wondering if he improved his diet if he will be able to stop the Repatha. He eats 2 eggs daily.  He tries to limit his sugar.   It is a more expensive medication for him.  His blood pressure is optimally controlled.  He denies angina, dizziness, dyspnea or lightheadedness.  No orthopnea or PND.         Vitals:    10/30/21 1052   BP: 124/70   BP Source: Arm, Left Upper   Pulse: 60   SpO2: 97%   O2 Device: None (Room air)   PainSc: Zero   Weight: 89.1 kg (196 lb 6.4 oz)   Height: 172.7 cm (5' 8)     Body mass index is 29.86 kg/m?Marland Kitchen     Past Medical History  Patient Active Problem List    Diagnosis Date Noted   ? Encounter for monitoring sotalol therapy 07/07/2021   ? Chronic heart failure with preserved ejection fraction (HCC) 07/07/2021   ? Balance problem 06/24/2021   ? 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 Head trauma, initial encounter 10/20/2018   ? Carpal tunnel syndrome of left wrist 07/05/2018   ? PTSD (post-traumatic stress disorder) 07/05/2018   ? Benign prostatic hyperplasia with lower urinary tract symptoms 06/03/2017   ? Shoulder arthritis 04/05/2017   ? Trigger middle finger of right hand 12/09/2016   ? Paroxysmal ventricular tachycardia 08/13/2016     06/07/2019 - ECHO:  Normal left ventricular size and systolic function.  LVEF 65%.  There is paradoxical septal wall motion abnormality.  Normal right ventricular size and systolic function.  Pacer lead was visualized in the right ventricle.  No hemodynamically significant valve disease.  There is moderate mitral annular calcification.  No pericardial effusion.  Mildly dilated sinus of Valsalva.  02/11/2020 - ECHO:  Normal left ventricular size. Prominent basal septum. No outflow tract obstruction. Normal systolic function. Estimated EF ~65%.  Abnormal septal wall motion consistent with right ventricular pacing.  Unable to assess diastolic function.  Normal right ventricular size and systolic function.  The left atrium is mildly dilated.  Normal right atrial size.  Device leads in right-sided chambers.  Moderate calcification of the mitral valve annulus with extension into the leaflets.  Very mild stenosis.  Mean gradient 3-4 mmHg at a heart rate of 78 bpm.  Mild regurgitation.  Aortic valve sclerosis without stenosis.  Trivial regurgitation.  Mild to moderate tricuspid valve regurgitation.  No pericardial effusion.  Estimated pulmonary artery systolic pressure 26 mmHg.   The aortic root is dilated, measuring 3.8 cm at the sinus of Valsalva.  04/30/2021 - ECHO:  A septal wall motion abnormality is noted consistent with right ventricular pacing. ?No other regional wall motion abnormalities are identified. Overall left ventricular systolic function appears normal. The estimated left ventricular ejection fraction is 60%. Left ventricular contractility appears similar when compared with the prior echocardiogram performed on 12/04/20.   Mild basal septal hypertrophy is noted. The Doppler recording obtained from the left ventricular outflow tract has normal velocity and profile and does not suggest resting left ventricular outflow tract obstruction.  Grade II (moderate) left ventricular diastolic dysfunction. Elevated left atrial pressure.  The right ventricle is not visualized well. Limited views suggest normal right ventricular size and contractility.  Mild left atrial enlargement.  Aortic valve sclerosis without stenosis.  No pericardial effusion is seen.       ? Right inguinal hernia 12/06/2015   ? Cardiac pacemaker in situ 10/18/2015     ? 10/21/15 [employer] dual-chamber PPM implantation - Dr. Naoma Diener     ? 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 BMI 31.0-31.9,adult 08/10/2015   ? Primary osteoarthritis of left knee 06/04/2015   ? Visit for preventive health examination 09/05/2012   ? AV block, 2nd degree 07/14/2010   ? Leg cramps, sleep related 04/10/2010   ? 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 Micheline Rough: 10-20% prox-CFX, 20-30% prox-RCA  06/08/2019 - CCTA:  Extensive coronary artery calcifications making the accurate estimation of stenosis difficult.  Normal coronary artery system origins and course. Right dominant system.  The left anterior descending artery with extensive coronary artery calcification in the proximal to mid segments severe stenosis cannot be excluded. The left anterior descending artery is patent in the mid to distal segments and not well-visualized apically. Study will be assessed by FFR to evaluate hemodynamically.  The left circumflex artery has mild to moderate calcific atherosclerotic changes without obvious stenosis.  The right coronary artery has mild to moderate calcific atherosclerotic changes with mild stenosis proximally. The PDA is not visualized distally.   12/04/2020 - ECHO:  Technically difficult study; i.v. transpulmonary contrast was used to define the endocardial borders.  Normal left ventricular systolic function, estimated ejection fraction is 65%.  Abnormal septal motion consistent with right ventricular pacing.  Grade III (severe) left ventricular diastolic dysfunction. Elevated left atrial pressure.  The right ventricular size, wall thickness and systolic function are normal.  Left Atrium: Mildly dilated.  Severe mitral annular calcification, the mitral valve is thickened and calcified, mild stenosis, MG =3 mmHg, trace regurgitation.  Mild tricuspid valve regurgitation.  Estimated Peak Systolic PA Pressure 24 mmHg.         ? HLD (hyperlipidemia) 04/11/2009   ? Essential hypertension 04/11/2009   ? GERD (gastroesophageal reflux disease) 04/11/2009   ? Sensorineural hearing loss 04/11/2009   ? Erectile dysfunction of non-organic origin 04/11/2009   ? Hypothyroid 04/11/2009   ? PAF (paroxysmal atrial fibrillation) (HCC) 04/11/2009   ? Chronic anticoagulation, on warfarin 04/11/2009         Review of Systems   Constitutional: Negative.   HENT: Negative.    Eyes: Negative.    Cardiovascular: Negative.    Respiratory: Negative.    Endocrine: Negative.    Hematologic/Lymphatic: Negative.    Skin: Negative.    Musculoskeletal: Negative.    Gastrointestinal: Negative.  Genitourinary: Negative.    Neurological: Negative.    Psychiatric/Behavioral: Negative.    Allergic/Immunologic: Negative.        Physical Exam   Vitals reviewed.  Constitutional: Vital signs are normal. He appears well-developed and well-nourished. No distress.   Cardiovascular: Normal rate.   Pulmonary/Chest: Effort normal. No respiratory distress.   Musculoskeletal:         General: No edema. Normal range of motion.      Cervical back: Normal range of motion.   Neurological: He is alert and oriented to person, place, and time.   Skin: Skin is warm and dry.   Psychiatric: He has a normal mood and affect. His behavior is normal. Judgment and thought content normal.         Cardiovascular Studies      Cardiovascular Health Factors  Vitals BP Readings from Last 3 Encounters:   10/30/21 124/70   10/02/21 135/79   09/15/21 128/82     Wt Readings from Last 3 Encounters:   10/30/21 89.1 kg (196 lb 6.4 oz)   10/02/21 87.1 kg (192 lb 0.3 oz)   09/15/21 88.4 kg (194 lb 12.8 oz)     BMI Readings from Last 3 Encounters:   10/30/21 29.86 kg/m?   10/02/21 29.20 kg/m?   09/15/21 29.62 kg/m?      Smoking Social History     Tobacco Use   Smoking Status Never Smoker   Smokeless Tobacco Never Used      Lipid Profile Cholesterol   Date Value Ref Range Status   09/15/2021 168 <200 MG/DL Final     HDL   Date Value Ref Range Status   09/15/2021 59 >40 MG/DL Final     LDL   Date Value Ref Range Status   09/15/2021 96 <100 mg/dL Final     Triglycerides   Date Value Ref Range Status   09/15/2021 81 <150 MG/DL Final      Blood Sugar Hemoglobin A1C   Date Value Ref Range Status   04/30/2021 5.6 4.0 - 6.0 % Final     Comment:     The ADA recommends that most patients with type 1 and type 2 diabetes maintain   an A1c level <7%.       Glucose   Date Value Ref Range Status   09/15/2021 96 70 - 100 MG/DL Final   75/64/3329 518 (H) 70 - 100 MG/DL Final   84/16/6063 96 70 - 100 MG/DL Final   01/60/1093 94 70 - 110 MG/DL Final   23/55/7322 025 (H) 70 - 110 MG/DL Final   42/70/6237 96 70 - 110 MG/DL Final     Glucose, POC   Date Value Ref Range Status   05/18/2010 89 70 - 100 MG/DL Final   62/83/1517 616 (H) 70 - 100 MG/DL Final   07/37/1062 694 (H) 70 - 100 MG/DL Final          Problems Addressed Today  Encounter Diagnoses   Name Primary?   ? Pure hypercholesterolemia Yes   ? Prediabetes    ? Coronary artery disease, non-occlusive    ? Essential hypertension    ? PAF (paroxysmal atrial fibrillation) (HCC)    ? Cardiac pacemaker in situ    ? Encounter for long-term (current) use of high-risk medication        Assessment and Plan     Coronary artery disease previous intervention.  Without anginal symptoms.  LDL goal less than 70.  We will  continue with risk factor reduction.  He will follow-up with Dr. Vivianne Spence as planned.    Dyslipidemia.  A recent LDL 96.  It has fluctuated between 58 and 83.  He is currently taking Repatha in addition to atorvastatin and Zetia.  We will repeat lipid panel today.  I spent time discussing nutritional changes with him.  I will see him back in 3 months.    History of atrial fibrillation.  Left atrial appendage ligation.  Warfarin was just discontinued after a TEE.  His rate is controlled.  He remains on sotalol.  He will follow-up with Dr. [name] as planned    Blood pressure is optimally controlled.  No changes are made today.  He will continue with lisinopril    Exercise regimen is good    BMI of 29 has increased.  He will work on increasing vegetables decreasing saturated fats increasing omega-3's         Current Medications (including today's revisions)  ? aspirin EC 81 mg tablet Take 1 tablet by mouth daily. Take with food.   ? atorvastatin (LIPITOR) 40 mg tablet Take one tablet by mouth daily.   ? calcium carbonate/vitamin D3 (VITAMIN D-3 PO) Take  by mouth.   ? cholecalciferol (VITAMIN D3) 50 mcg (2,000 unit) tablet Take 2,000 Units by mouth daily.   ? cyanocobalamin (VITAMIN B-12) 1,000 mcg tablet Take one tablet by mouth daily. Start this the day after taking your one time Vitamin B12 injection.   ? duloxetine DR (CYMBALTA) 30 mg capsule TAKE ONE CAPSULE BY MOUTH ONCE DAILY   ? evolocumab (REPATHA SURECLICK) 140 mg/mL injectable PEN Inject 1 mL under the skin every 14 days.   ? ezetimibe (ZETIA) 10 mg tablet Take 1 tablet by mouth once daily  Indications: excessive fat in the blood   ? levothyroxine (SYNTHROID) 50 mcg tablet Take one tablet by mouth daily.   ? lisinopriL (ZESTRIL) 20 mg tablet Take one tablet by mouth daily.   ? OMEGA-3 FATTY ACIDS/FISH OIL (FISH OIL-OMEGA-3 FATTY ACIDS PO) Take 4 tablets by mouth daily.   ? sotaloL (BETAPACE) 120 mg tablet Take one tablet by mouth twice daily.

## 2021-10-30 NOTE — Patient Instructions
FOLLOW UP: PLEASE SCHEDULE BEFORE LEAVING    Please call scheduling at [phone]  to schedule your 4 month follow up appointment with Dina Rich, APRN. Please have labs drawn prior to this visit     NOTE:  Please arrive 20 minutes before appointment time to allow adequate time for registration and rooming.    In order to provide you the best care possible we ask that you follow up as below:    For questions: please call [place] line at [phone] Monday - Friday 8-5 only.         Please leave a detailed message with your name, date of birth, and reason for your call.  A nurse will return your call as soon as possible.     For all medication refills: please contact your pharmacy.      Scheduling: Please call [phone]    It was truly our pleasure seeing you today. Thank you for choosing [facility] for your heart health!    Instructions given by Denny Levy, RN

## 2021-10-31 ENCOUNTER — Encounter: Admit: 2021-10-31 | Discharge: 2021-10-31

## 2021-11-04 ENCOUNTER — Encounter: Admit: 2021-11-04 | Discharge: 2021-11-04

## 2021-11-04 DIAGNOSIS — E7800 Pure hypercholesterolemia, unspecified: Secondary | ICD-10-CM

## 2021-11-04 DIAGNOSIS — I2510 Atherosclerotic heart disease of native coronary artery without angina pectoris: Secondary | ICD-10-CM

## 2021-11-04 NOTE — Telephone Encounter
Verbenec, Milas Gain, APRN-NP  [facility]  Cholesterol has significantly improved. LDL 9. We discussed 2 options. Either discontinue Zetia or take injections every 3 weeks versus 2 weeks. I will let him decide due to cost reasons he was inquiring about extending the injections. I think that will be fine.     Follow-up as planned, repeat lipid panel with next visit.     Thank you

## 2021-11-04 NOTE — Telephone Encounter
Reviewed lab results with patient. He would like to stop taking the Zetia and recheck labs at the end of February/ beginning of March. Lab orders placed.

## 2021-11-11 ENCOUNTER — Encounter: Admit: 2021-11-11 | Discharge: 2021-11-11

## 2021-11-11 DIAGNOSIS — I480 Paroxysmal atrial fibrillation: Secondary | ICD-10-CM

## 2021-11-11 NOTE — Progress Notes
Per TEE result note:    "Ashley-please let him know the transesophageal echo shows complete obliteration of the left atrial appendage. I think he can discontinue his warfarin. Thanks RG"

## 2021-11-18 ENCOUNTER — Encounter: Admit: 2021-11-18 | Discharge: 2021-11-18

## 2021-11-19 ENCOUNTER — Encounter: Admit: 2021-11-19 | Discharge: 2021-11-19

## 2021-11-19 DIAGNOSIS — I4729 NSVT (nonsustained ventricular tachycardia): Secondary | ICD-10-CM

## 2021-11-19 DIAGNOSIS — I471 Supraventricular tachycardia: Secondary | ICD-10-CM

## 2021-12-11 NOTE — Progress Notes
Pt hospitalized since last office visit: No    Health Maintenance Due   Topic Date Due    INFLUENZA VACCINE (1) 07/21/2021    COVID-19 VACCINE (5 - Booster for Moderna series) 08/19/2021    MEDICARE ANNUAL WELLNESS VISIT  08/22/2021       There are no diagnoses linked to this encounter.    Labs not done:      Lab Frequency Next Occurrence   REQUEST FOR CARDIOLOGY APPOINTMENT Once 12/09/2017   REQUEST FOR CARDIOLOGY APPOINTMENT Once 07/07/2019   REQUEST FOR CARDIOLOGY APPOINTMENT Once 03/11/2020   METHYLMALONIC ACID QUANT Once 01/02/2021   HOMOCYSTEINE Once 01/02/2021   VITAMIN B12 Once 01/02/2021   ECG 12-LEAD Once 07/07/2021   CBC AND DIFF Once 38/75/6433   BASIC METABOLIC PANEL Once 29/51/8841   HEMOGLOBIN A1C Once 10/22/2021   LIPID PROFILE Once 10/22/2021   LIVER FUNCTION PANEL Once 10/22/2021   TSH WITH FREE T4 REFLEX Once 10/22/2021   DEVICE EVALUATION - PPM (PERMANENT PACEMAKER) Once 07/30/2022   REQUEST FOR CARDIOLOGY APPOINTMENT Once 07/30/2022   LIPID PROFILE Once 12/16/2021   REQUEST FOR CARDIOLOGY APPOINTMENT Once 02/27/2022   LIPID PROFILE Once 02/04/2022   REQUEST FOR CARDIOLOGY APPOINTMENT Once 11/19/2021   DEVICE EVALUATION - REMOTE PPM         Labs drawn by Dr Trudee Kuster on 11/24/21.     Notes to provider:

## 2021-12-23 ENCOUNTER — Ambulatory Visit: Admit: 2021-12-23 | Discharge: 2021-12-23 | Payer: MEDICARE

## 2021-12-23 ENCOUNTER — Encounter: Admit: 2021-12-23 | Discharge: 2021-12-23

## 2021-12-23 VITALS — BP 138/86 | HR 66

## 2021-12-23 VITALS — BP 161/92 | HR 64 | Temp 97.30000°F | Resp 16 | Ht 67.992 in | Wt 197.2 lb

## 2021-12-23 DIAGNOSIS — I2510 Atherosclerotic heart disease of native coronary artery without angina pectoris: Principal | ICD-10-CM

## 2021-12-23 DIAGNOSIS — I480 Paroxysmal atrial fibrillation: Secondary | ICD-10-CM

## 2021-12-23 DIAGNOSIS — Z7901 Long term (current) use of anticoagulants: Secondary | ICD-10-CM

## 2021-12-23 DIAGNOSIS — K219 Gastro-esophageal reflux disease without esophagitis: Secondary | ICD-10-CM

## 2021-12-23 DIAGNOSIS — I4891 Unspecified atrial fibrillation: Secondary | ICD-10-CM

## 2021-12-23 DIAGNOSIS — L821 Other seborrheic keratosis: Secondary | ICD-10-CM

## 2021-12-23 DIAGNOSIS — D489 Neoplasm of uncertain behavior, unspecified: Secondary | ICD-10-CM

## 2021-12-23 DIAGNOSIS — Z950 Presence of cardiac pacemaker: Secondary | ICD-10-CM

## 2021-12-23 DIAGNOSIS — R5383 Other fatigue: Secondary | ICD-10-CM

## 2021-12-23 DIAGNOSIS — R519 Generalized headaches: Secondary | ICD-10-CM

## 2021-12-23 DIAGNOSIS — L304 Erythema intertrigo: Secondary | ICD-10-CM

## 2021-12-23 DIAGNOSIS — L570 Actinic keratosis: Secondary | ICD-10-CM

## 2021-12-23 DIAGNOSIS — H9190 Unspecified hearing loss, unspecified ear: Secondary | ICD-10-CM

## 2021-12-23 DIAGNOSIS — E7800 Pure hypercholesterolemia, unspecified: Secondary | ICD-10-CM

## 2021-12-23 DIAGNOSIS — R7301 Impaired fasting glucose: Secondary | ICD-10-CM

## 2021-12-23 DIAGNOSIS — I10 Essential (primary) hypertension: Secondary | ICD-10-CM

## 2021-12-23 DIAGNOSIS — E039 Hypothyroidism, unspecified: Secondary | ICD-10-CM

## 2021-12-23 DIAGNOSIS — I495 Sick sinus syndrome: Secondary | ICD-10-CM

## 2021-12-23 DIAGNOSIS — M1990 Unspecified osteoarthritis, unspecified site: Secondary | ICD-10-CM

## 2021-12-23 DIAGNOSIS — M25569 Pain in unspecified knee: Secondary | ICD-10-CM

## 2021-12-23 DIAGNOSIS — L578 Other skin changes due to chronic exposure to nonionizing radiation: Secondary | ICD-10-CM

## 2021-12-23 DIAGNOSIS — Z823 Family history of stroke: Secondary | ICD-10-CM

## 2021-12-23 DIAGNOSIS — B079 Viral wart, unspecified: Secondary | ICD-10-CM

## 2021-12-23 DIAGNOSIS — N401 Enlarged prostate with lower urinary tract symptoms: Secondary | ICD-10-CM

## 2021-12-23 DIAGNOSIS — R001 Bradycardia, unspecified: Secondary | ICD-10-CM

## 2021-12-23 DIAGNOSIS — B353 Tinea pedis: Secondary | ICD-10-CM

## 2021-12-23 DIAGNOSIS — E785 Hyperlipidemia, unspecified: Secondary | ICD-10-CM

## 2021-12-23 DIAGNOSIS — F5221 Male erectile disorder: Secondary | ICD-10-CM

## 2021-12-23 DIAGNOSIS — R42 Dizziness and giddiness: Secondary | ICD-10-CM

## 2021-12-23 DIAGNOSIS — B351 Tinea unguium: Secondary | ICD-10-CM

## 2021-12-23 DIAGNOSIS — J302 Other seasonal allergic rhinitis: Secondary | ICD-10-CM

## 2021-12-23 MED ORDER — DULOXETINE 30 MG PO CPDR
30 mg | ORAL_CAPSULE | Freq: Every day | ORAL | 3 refills | 60.00000 days | Status: AC
Start: 2021-12-23 — End: ?
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
## 2021-12-23 NOTE — Progress Notes
Patient presents for high dose flu shot.  Patient verified name and date of birth.  Consent signed for high dose influenza vaccination.  Patient gave verbal consent for this RN to administer.  Injected to Patient's right deltoid.  Patient tolerated well.  Health Maintenance updated.  Helayne Seminole, RN    Pt presented to clinic for booster dose of pfizer bivalent.  Injected 0.3 ml into left deltoid.  Pt remained for 15 minute observation.  Pt tolerated well.

## 2021-12-23 NOTE — Patient Instructions
Get fasting labs few days before return to clinic in 6 months

## 2022-02-13 ENCOUNTER — Encounter: Admit: 2022-02-13 | Discharge: 2022-02-13

## 2022-02-16 ENCOUNTER — Encounter: Admit: 2022-02-16 | Discharge: 2022-02-16

## 2022-02-16 NOTE — Telephone Encounter
Patient confirmed that he is taking 120 mg sotalol twice a day regularly. Will continue to monitor per RCP. Patient verbalized understanding and does not have any further questions or concerns. Patient has our contact information for future needs.

## 2022-02-16 NOTE — Telephone Encounter
-----   Message from Tana Conch sent at 02/16/2022 12:51 PM CST -----  Regarding: RCP 9 seconds of NSVT, 26 beats, pt has a ppm  Remote transmission presents with 9 seconds of  NSVT on PPM.  Stored EGMs are consistent with or suggestive of Non-sustained VT  Total episodes: 2  Most recent on 02/13/22 @ 10:38pm  Longest on 02/12/22 @ 10:13pm, duration 9 second, device declared duration 15 seconds, 26 beats, abrupt onset and termination is visible. Patient has a pacemaker.  Complete report sent to chart for further detail/review.  Follow up as needed.      Thank you,  Terri/Device Team

## 2022-02-16 NOTE — Telephone Encounter
Spoke with patient who denies any symptoms. He is complaint with sotalol 120 mg BID and lisinopril 20 mg daily.   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   Most recent labs from 11/2021 are stable.   OV with RCP on 04/08/22.     Sent to RCP for recs.

## 2022-02-18 ENCOUNTER — Encounter: Admit: 2022-02-18 | Discharge: 2022-02-18

## 2022-03-04 ENCOUNTER — Encounter: Admit: 2022-03-04 | Discharge: 2022-03-04 | Payer: MEDICARE

## 2022-03-04 ENCOUNTER — Ambulatory Visit: Admit: 2022-03-04 | Discharge: 2022-03-04 | Payer: MEDICARE

## 2022-03-04 DIAGNOSIS — R001 Bradycardia, unspecified: Secondary | ICD-10-CM

## 2022-03-04 DIAGNOSIS — I2510 Atherosclerotic heart disease of native coronary artery without angina pectoris: Secondary | ICD-10-CM

## 2022-03-04 DIAGNOSIS — E039 Hypothyroidism, unspecified: Secondary | ICD-10-CM

## 2022-03-04 DIAGNOSIS — B353 Tinea pedis: Secondary | ICD-10-CM

## 2022-03-04 DIAGNOSIS — M1990 Unspecified osteoarthritis, unspecified site: Secondary | ICD-10-CM

## 2022-03-04 DIAGNOSIS — J302 Other seasonal allergic rhinitis: Secondary | ICD-10-CM

## 2022-03-04 DIAGNOSIS — E7800 Pure hypercholesterolemia, unspecified: Secondary | ICD-10-CM

## 2022-03-04 DIAGNOSIS — E785 Hyperlipidemia, unspecified: Secondary | ICD-10-CM

## 2022-03-04 DIAGNOSIS — I10 Essential (primary) hypertension: Secondary | ICD-10-CM

## 2022-03-04 DIAGNOSIS — I480 Paroxysmal atrial fibrillation: Secondary | ICD-10-CM

## 2022-03-04 DIAGNOSIS — L570 Actinic keratosis: Secondary | ICD-10-CM

## 2022-03-04 DIAGNOSIS — L821 Other seborrheic keratosis: Secondary | ICD-10-CM

## 2022-03-04 DIAGNOSIS — B079 Viral wart, unspecified: Secondary | ICD-10-CM

## 2022-03-04 DIAGNOSIS — Z7901 Long term (current) use of anticoagulants: Secondary | ICD-10-CM

## 2022-03-04 DIAGNOSIS — D489 Neoplasm of uncertain behavior, unspecified: Secondary | ICD-10-CM

## 2022-03-04 DIAGNOSIS — K219 Gastro-esophageal reflux disease without esophagitis: Secondary | ICD-10-CM

## 2022-03-04 DIAGNOSIS — M25569 Pain in unspecified knee: Secondary | ICD-10-CM

## 2022-03-04 DIAGNOSIS — L304 Erythema intertrigo: Secondary | ICD-10-CM

## 2022-03-04 DIAGNOSIS — I4729 Paroxysmal ventricular tachycardia: Secondary | ICD-10-CM

## 2022-03-04 DIAGNOSIS — B351 Tinea unguium: Secondary | ICD-10-CM

## 2022-03-04 DIAGNOSIS — Z823 Family history of stroke: Secondary | ICD-10-CM

## 2022-03-04 DIAGNOSIS — I4891 Unspecified atrial fibrillation: Secondary | ICD-10-CM

## 2022-03-04 DIAGNOSIS — L578 Other skin changes due to chronic exposure to nonionizing radiation: Secondary | ICD-10-CM

## 2022-03-04 DIAGNOSIS — R7303 Prediabetes: Secondary | ICD-10-CM

## 2022-03-04 DIAGNOSIS — R5383 Other fatigue: Secondary | ICD-10-CM

## 2022-03-04 DIAGNOSIS — F5221 Male erectile disorder: Secondary | ICD-10-CM

## 2022-03-04 DIAGNOSIS — I495 Sick sinus syndrome: Secondary | ICD-10-CM

## 2022-03-04 DIAGNOSIS — Z950 Presence of cardiac pacemaker: Secondary | ICD-10-CM

## 2022-03-04 DIAGNOSIS — R42 Dizziness and giddiness: Secondary | ICD-10-CM

## 2022-03-04 DIAGNOSIS — R519 Generalized headaches: Secondary | ICD-10-CM

## 2022-03-04 DIAGNOSIS — H9190 Unspecified hearing loss, unspecified ear: Secondary | ICD-10-CM

## 2022-03-04 NOTE — Progress Notes
Date of Service: 03/04/2022    Jonathan Humphrey is a 88 y.o. male.       HPI     He is a pleasant 88 year old male being seen for risk factor reduction.  He is followed by Dr. Vivianne Spence and [place].  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  Previous PCI and pacemaker placement.     He saw Dr. Vivianne Spence in September.  A lipid panel showed LDL had increased to a suboptimal level of 96.   He was started on Repatha in addition to his already taking atorvastatin 40 mg and Zetia 10 mg.  LDL decreased to 9.  We discontinued Zetia.  A repeat lipid panel shows LDL 11     Unfortunately his wife passed away in Dec 23, 2023.  He has been started on Lexapro.  His daughter from [place] has been staying with him.  He is going to celebrate his birthday in a few days.     He is exercising daily by walking his dog, riding stationary bike twice daily he also stays very busy in his shop.       He denies angina, dizziness, dyspnea or lightheadedness.  No orthopnea or PND    Pacemaker interrogation February 2 episodes of nonsustained V. tach.  He remains on sotalol.  He is asymptomatic.  Both episodes at bedtime         Vitals:    03/04/22 1041   BP: 132/70   BP Source: Arm, Left Upper   Pulse: 63   SpO2: 96%   O2 Device: None (Room air)   PainSc: Zero   Weight: 87.6 kg (193 lb 3.2 oz)   Height: 175.3 cm (5' 9)     Body mass index is 28.53 kg/m?Marland Kitchen     Past Medical History  Patient Active Problem List    Diagnosis Date Noted   ? Encounter for monitoring sotalol therapy 07/07/2021   ? Chronic heart failure with preserved ejection fraction (HCC) 07/07/2021   ? Balance problem 06/24/2021   ? 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 Head trauma, initial encounter 10/20/2018   ? Carpal tunnel syndrome of left wrist 07/05/2018   ? PTSD (post-traumatic stress disorder) 07/05/2018   ? Benign prostatic hyperplasia with lower urinary tract symptoms 06/03/2017   ? Shoulder arthritis 04/05/2017   ? Trigger middle finger of right hand 12/09/2016   ? Paroxysmal ventricular tachycardia 08/13/2016     06/07/2019 - ECHO:  Normal left ventricular size and systolic function.  LVEF 65%.  There is paradoxical septal wall motion abnormality.  Normal right ventricular size and systolic function.  Pacer lead was visualized in the right ventricle.  No hemodynamically significant valve disease.  There is moderate mitral annular calcification.  No pericardial effusion.  Mildly dilated sinus of Valsalva.  02/11/2020 - ECHO:  Normal left ventricular size. Prominent basal septum. No outflow tract obstruction. Normal systolic function. Estimated EF ~65%.  Abnormal septal wall motion consistent with right ventricular pacing.  Unable to assess diastolic function.  Normal right ventricular size and systolic function.  The left atrium is mildly dilated.  Normal right atrial size.  Device leads in right-sided chambers.  Moderate calcification of the mitral valve annulus with extension into the leaflets.  Very mild stenosis.  Mean gradient 3-4 mmHg at a heart rate of 78 bpm.  Mild regurgitation.  Aortic valve sclerosis  without stenosis.  Trivial regurgitation.  Mild to moderate tricuspid valve regurgitation.  No pericardial effusion.  Estimated pulmonary artery systolic pressure 26 mmHg.   The aortic root is dilated, measuring 3.8 cm at the sinus of Valsalva.  04/30/2021 - ECHO:  A septal wall motion abnormality is noted consistent with right ventricular pacing. ?No other regional wall motion abnormalities are identified. Overall left ventricular systolic function appears normal. The estimated left ventricular ejection fraction is 60%. Left ventricular contractility appears similar when compared with the prior echocardiogram performed on 12/04/20.   Mild basal septal hypertrophy is noted. The Doppler recording obtained from the left ventricular outflow tract has normal velocity and profile and does not suggest resting left ventricular outflow tract obstruction.  Grade II (moderate) left ventricular diastolic dysfunction. Elevated left atrial pressure.  The right ventricle is not visualized well. Limited views suggest normal right ventricular size and contractility.  Mild left atrial enlargement.  Aortic valve sclerosis without stenosis.  No pericardial effusion is seen.       ? Right inguinal hernia 12/06/2015   ? Cardiac pacemaker in situ 10/18/2015     ? 10/21/15 [employer]onic dual-chamber PPM implantation - Dr. Naoma Diener     ? 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 BMI 31.0-31.9,adult 08/10/2015   ? Primary osteoarthritis of left knee 06/04/2015   ? Visit for preventive health examination 09/05/2012   ? AV block, 2nd degree 07/14/2010   ? Leg cramps, sleep related 04/10/2010   ? 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 Micheline Rough: 10-20% prox-CFX, 20-30% prox-RCA  06/08/2019 - CCTA:  Extensive coronary artery calcifications making the accurate estimation of stenosis difficult.  Normal coronary artery system origins and course. Right dominant system.  The left anterior descending artery with extensive coronary artery calcification in the proximal to mid segments severe stenosis cannot be excluded. The left anterior descending artery is patent in the mid to distal segments and not well-visualized apically. Study will be assessed by FFR to evaluate hemodynamically.  The left circumflex artery has mild to moderate calcific atherosclerotic changes without obvious stenosis.  The right coronary artery has mild to moderate calcific atherosclerotic changes with mild stenosis proximally. The PDA is not visualized distally.   12/04/2020 - ECHO:  Technically difficult study; i.v. transpulmonary contrast was used to define the endocardial borders.  Normal left ventricular systolic function, estimated ejection fraction is 65%.  Abnormal septal motion consistent with right ventricular pacing.  Grade III (severe) left ventricular diastolic dysfunction. Elevated left atrial pressure.  The right ventricular size, wall thickness and systolic function are normal.  Left Atrium: Mildly dilated.  Severe mitral annular calcification, the mitral valve is thickened and calcified, mild stenosis, MG =3 mmHg, trace regurgitation.  Mild tricuspid valve regurgitation.  Estimated Peak Systolic PA Pressure 24 mmHg.         ? HLD (hyperlipidemia) 04/11/2009   ? Essential hypertension 04/11/2009   ? GERD (gastroesophageal reflux disease) 04/11/2009   ? Sensorineural hearing loss 04/11/2009   ? Erectile dysfunction of non-organic origin 04/11/2009   ? Hypothyroid 04/11/2009         Review of Systems   Constitutional: Negative.   HENT: Negative.    Eyes: Negative.    Cardiovascular: Negative.    Respiratory: Negative.    Endocrine: Negative.    Hematologic/Lymphatic: Negative.    Skin: Negative.    Musculoskeletal: Negative.    Gastrointestinal: Negative.    Genitourinary:  Negative.    Neurological: Negative.    Psychiatric/Behavioral: Negative.    Allergic/Immunologic: Negative.        Physical Exam   Vitals reviewed.  Constitutional: Vital signs are normal. He appears well-developed and well-nourished. No distress.   Cardiovascular: Normal rate.   Pulmonary/Chest: Effort normal. No respiratory distress.   Musculoskeletal:         General: No edema. Normal range of motion.      Cervical back: Normal range of motion.   Neurological: He is alert and oriented to person, place, and time.   Skin: Skin is warm and dry.   Psychiatric: He has a normal mood and affect. His behavior is normal. Judgment and thought content normal.         Cardiovascular Studies      Cardiovascular Health Factors  Vitals BP Readings from Last 3 Encounters:   03/04/22 132/70   12/23/21 138/86   10/30/21 124/70     Wt Readings from Last 3 Encounters: 03/04/22 87.6 kg (193 lb 3.2 oz)   12/23/21 89.4 kg (197 lb 3.2 oz)   10/30/21 89.1 kg (196 lb 6.4 oz)     BMI Readings from Last 3 Encounters:   03/04/22 28.53 kg/m?   12/23/21 29.99 kg/m?   10/30/21 29.86 kg/m?      Smoking Social History     Tobacco Use   Smoking Status Never   Smokeless Tobacco Never      Lipid Profile Cholesterol   Date Value Ref Range Status   03/03/2022 109  Final     HDL   Date Value Ref Range Status   03/03/2022 86  Final     LDL   Date Value Ref Range Status   03/03/2022 11  Final     Triglycerides   Date Value Ref Range Status   03/03/2022 61  Final      Blood Sugar Hemoglobin A1C   Date Value Ref Range Status   10/30/2021 5.8 4.0 - 6.0 % Final     Comment:     The ADA recommends that most patients with type 1 and type 2 diabetes maintain   an A1c level <7%.       Glucose   Date Value Ref Range Status   11/24/2021 107  Final   09/15/2021 96 70 - 100 MG/DL Final   16/09/9603 540 (H) 70 - 100 MG/DL Final   98/10/9146 94 70 - 110 MG/DL Final   82/95/6213 086 (H) 70 - 110 MG/DL Final   57/84/6962 96 70 - 110 MG/DL Final     Glucose Fasting   Date Value Ref Range Status   10/30/2021 100 70 - 100 MG/DL Final     Glucose, POC   Date Value Ref Range Status   05/18/2010 89 70 - 100 MG/DL Final   95/28/4132 440 (H) 70 - 100 MG/DL Final   10/17/2535 644 (H) 70 - 100 MG/DL Final          Problems Addressed Today  Encounter Diagnoses   Name Primary?   ? Coronary artery disease, non-occlusive Yes   ? Pure hypercholesterolemia    ? Essential hypertension    ? Cardiac pacemaker in situ    ? Prediabetes    ? Paroxysmal ventricular tachycardia        Assessment and Plan     Coronary artery disease previous intervention.  Without anginal symptoms.  LDL goal less than 70.  We will continue with risk factor  reduction.  He will follow-up with Dr. [name] as planned.  ?  Dyslipidemia.  He is currently taking Repatha and atorvastatin 40 mg.  ?Recent LDL 11.  HDL has improved.  Triglycerides are optimal.  We will decrease atorvastatin to 20 mg.  Repeat lipid panel in 4 months      History of atrial fibrillation.  Left atrial appendage ligation.  Warfarin was discontinued after TEE.  His rate is controlled.  He remains on sotalol.  He will follow-up with Dr. [name] as planned    Nonsustained V. tach.  Electrolytes within normal limits.  Patient asymptomatic.  He remains on sotalol.  He will continue to follow with Dr. [name] as planned.    Blood pressure is optimally controlled.  No changes are made today.  He will continue with lisinopril  ?  Exercise regimen is good he will continue walking his dog several times daily.  He rides a stationary bike 15 minutes twice daily  ?  BMI of 28 .  I encouraged him to continue aiming to have balanced meals of lean protein fruits vegetables omega-3 fats    Hemoglobin A1c of 5.8.  He is going to decrease his boost intake and increase protein, vegetables berries and nuts    Current Medications (including today's revisions)  ? aspirin EC 81 mg tablet Take 1 tablet by mouth daily. Take with food.   ? atorvastatin (LIPITOR) 40 mg tablet Take one tablet by mouth daily.   ? calcium carbonate/vitamin D3 (VITAMIN D-3 PO) Take  by mouth.   ? cholecalciferol (VITAMIN D3) 50 mcg (2,000 unit) tablet Take one tablet by mouth daily.   ? cyanocobalamin (VITAMIN B-12) 1,000 mcg tablet Take one tablet by mouth daily. Start this the day after taking your one time Vitamin B12 injection.   ? escitalopram oxalate (LEXAPRO) 10 mg tablet    ? evolocumab (REPATHA SURECLICK) 140 mg/mL injectable PEN Inject 1 mL under the skin every 14 days.   ? levothyroxine (SYNTHROID) 50 mcg tablet Take one tablet by mouth daily.   ? lisinopriL (ZESTRIL) 20 mg tablet Take one tablet by mouth daily.   ? OMEGA-3 FATTY ACIDS/FISH OIL (FISH OIL-OMEGA-3 FATTY ACIDS PO) Take 4 tablets by mouth daily.   ? sotaloL (BETAPACE) 120 mg tablet Take one tablet by mouth twice daily.

## 2022-03-04 NOTE — Patient Instructions
FOLLOW UP: PLEASE SCHEDULE BEFORE LEAVING    Please call scheduling at [phone]  to schedule your 4 month follow up appointment with Dina Rich, APRN. Please have fasting labs drawn a few days prior to this appointment. We will fax the orders to [facility] in [place] for you.     NOTE:  Please arrive 20 minutes before appointment time to allow adequate time for registration and rooming.    In order to provide you the best care possible we ask that you follow up as below:    For questions: please call [place] line at [phone] Monday - Friday 8-5 only.         Please leave a detailed message with your name, date of birth, and reason for your call.  A nurse will return your call as soon as possible.     For all medication refills: please contact your pharmacy.      Scheduling: Please call [phone]    It was truly our pleasure seeing you today. Thank you for choosing [facility] for your heart health!    Instructions given by Denny Levy, RN

## 2022-03-04 NOTE — Progress Notes
REQUEST FOR MOST RECENT LAB RESULTS ON MUTUAL PATIENT       Jonathan Humphrey  DATE OF BIRTH 1934-03-05          PLEASE FAX REQUESTED DOCUMENTS TO:  [facility] # [id number]   ATTENTION:  Caryl Pina, RN     THANK YOU!

## 2022-03-23 ENCOUNTER — Encounter: Admit: 2022-03-23 | Discharge: 2022-03-23 | Payer: MEDICARE

## 2022-03-23 MED ORDER — SOTALOL 120 MG PO TAB
ORAL_TABLET | ORAL | 1 refills | 30.00000 days | Status: AC
Start: 2022-03-23 — End: ?

## 2022-04-04 ENCOUNTER — Encounter: Admit: 2022-04-04 | Discharge: 2022-04-04 | Payer: MEDICARE

## 2022-04-04 DIAGNOSIS — Z7901 Long term (current) use of anticoagulants: Secondary | ICD-10-CM

## 2022-04-04 DIAGNOSIS — I480 Paroxysmal atrial fibrillation: Secondary | ICD-10-CM

## 2022-04-04 DIAGNOSIS — F5221 Male erectile disorder: Secondary | ICD-10-CM

## 2022-04-04 DIAGNOSIS — R519 Generalized headaches: Secondary | ICD-10-CM

## 2022-04-04 DIAGNOSIS — J302 Other seasonal allergic rhinitis: Secondary | ICD-10-CM

## 2022-04-04 DIAGNOSIS — L821 Other seborrheic keratosis: Secondary | ICD-10-CM

## 2022-04-04 DIAGNOSIS — K219 Gastro-esophageal reflux disease without esophagitis: Secondary | ICD-10-CM

## 2022-04-04 DIAGNOSIS — L578 Other skin changes due to chronic exposure to nonionizing radiation: Secondary | ICD-10-CM

## 2022-04-04 DIAGNOSIS — H9190 Unspecified hearing loss, unspecified ear: Secondary | ICD-10-CM

## 2022-04-04 DIAGNOSIS — E785 Hyperlipidemia, unspecified: Secondary | ICD-10-CM

## 2022-04-04 DIAGNOSIS — Z823 Family history of stroke: Secondary | ICD-10-CM

## 2022-04-04 DIAGNOSIS — B351 Tinea unguium: Secondary | ICD-10-CM

## 2022-04-04 DIAGNOSIS — M25569 Pain in unspecified knee: Secondary | ICD-10-CM

## 2022-04-04 DIAGNOSIS — R42 Dizziness and giddiness: Secondary | ICD-10-CM

## 2022-04-04 DIAGNOSIS — I4891 Unspecified atrial fibrillation: Secondary | ICD-10-CM

## 2022-04-04 DIAGNOSIS — L304 Erythema intertrigo: Secondary | ICD-10-CM

## 2022-04-04 DIAGNOSIS — I495 Sick sinus syndrome: Secondary | ICD-10-CM

## 2022-04-04 DIAGNOSIS — E039 Hypothyroidism, unspecified: Secondary | ICD-10-CM

## 2022-04-04 DIAGNOSIS — L570 Actinic keratosis: Secondary | ICD-10-CM

## 2022-04-04 DIAGNOSIS — B353 Tinea pedis: Secondary | ICD-10-CM

## 2022-04-04 DIAGNOSIS — M1990 Unspecified osteoarthritis, unspecified site: Secondary | ICD-10-CM

## 2022-04-04 DIAGNOSIS — R5383 Other fatigue: Secondary | ICD-10-CM

## 2022-04-04 DIAGNOSIS — I10 Essential (primary) hypertension: Secondary | ICD-10-CM

## 2022-04-04 DIAGNOSIS — B079 Viral wart, unspecified: Secondary | ICD-10-CM

## 2022-04-04 DIAGNOSIS — Z9889 Other specified postprocedural states: Secondary | ICD-10-CM

## 2022-04-04 DIAGNOSIS — R001 Bradycardia, unspecified: Secondary | ICD-10-CM

## 2022-04-04 DIAGNOSIS — D489 Neoplasm of uncertain behavior, unspecified: Secondary | ICD-10-CM

## 2022-04-08 ENCOUNTER — Encounter: Admit: 2022-04-08 | Discharge: 2022-04-08 | Payer: MEDICARE

## 2022-04-29 ENCOUNTER — Encounter: Admit: 2022-04-29 | Discharge: 2022-04-29 | Payer: MEDICARE

## 2022-04-29 DIAGNOSIS — I10 Essential (primary) hypertension: Secondary | ICD-10-CM

## 2022-04-29 MED ORDER — LISINOPRIL 20 MG PO TAB
20 mg | ORAL_TABLET | Freq: Every day | ORAL | 3 refills | Status: AC
Start: 2022-04-29 — End: ?

## 2022-05-15 ENCOUNTER — Encounter: Admit: 2022-05-15 | Discharge: 2022-05-15 | Payer: MEDICARE

## 2022-05-19 ENCOUNTER — Encounter: Admit: 2022-05-19 | Discharge: 2022-05-19 | Payer: MEDICARE

## 2022-05-19 NOTE — Progress Notes
Pt hospitalized since last office visit: No    Health Maintenance Due   Topic Date Due    MEDICARE ANNUAL WELLNESS VISIT  08/22/2021    ADVANCED CARE PLANNING DISCUSSION AND DOCUMENTATION  12/21/2021    PHYSICAL (COMPREHENSIVE) EXAM  12/24/2021       There are no diagnoses linked to this encounter.    Labs not done:      Lab Frequency Next Occurrence   REQUEST FOR CARDIOLOGY APPOINTMENT Once 12/09/2017   REQUEST FOR CARDIOLOGY APPOINTMENT Once 07/07/2019   REQUEST FOR CARDIOLOGY APPOINTMENT Once 03/11/2020   ECG 12-LEAD Once 07/07/2021   CBC AND DIFF Once 62/83/1517   BASIC METABOLIC PANEL Once 61/60/7371   HEMOGLOBIN A1C Once 10/22/2021   LIPID PROFILE Once 10/22/2021   LIVER FUNCTION PANEL Once 10/22/2021   TSH WITH FREE T4 REFLEX Once 10/22/2021   DEVICE EVALUATION - PPM (PERMANENT PACEMAKER) Once 07/30/2022   REQUEST FOR CARDIOLOGY APPOINTMENT Once 07/30/2022   LIPID PROFILE Once 12/16/2021   LIPID PROFILE Once 02/04/2022   REQUEST FOR CARDIOLOGY APPOINTMENT Once 11/19/2021   CBC AND DIFF Once 06/15/9484   BASIC METABOLIC PANEL Once 46/27/0350   HEMOGLOBIN A1C Once 04/22/2022   LIPID PROFILE Once 04/22/2022   LIVER FUNCTION PANEL Once 04/22/2022   TSH WITH FREE T4 REFLEX Once 04/22/2022   LIPID PROFILE Once 07/04/2022   HEMOGLOBIN A1C Once 07/04/2022   ALT (SGPT) Once 07/04/2022   AST (SGOT) Once 07/04/2022   GLUCOSE, FASTING Once 07/04/2022   REQUEST FOR CARDIOLOGY APPOINTMENT Once 07/04/2022   DEVICE EVALUATION - PPM (PERMANENT PACEMAKER) Once 04/08/2022   DEVICE EVALUATION - REMOTE PPM         MyChart message sent to patient on 05/30/23Angie Glennon Mac, RN     Notes to provider:

## 2022-05-26 ENCOUNTER — Encounter: Admit: 2022-05-26 | Discharge: 2022-05-26 | Payer: MEDICARE

## 2022-05-26 ENCOUNTER — Ambulatory Visit: Admit: 2022-05-26 | Discharge: 2022-05-26 | Payer: MEDICARE

## 2022-05-26 DIAGNOSIS — K219 Gastro-esophageal reflux disease without esophagitis: Secondary | ICD-10-CM

## 2022-05-26 DIAGNOSIS — H9190 Unspecified hearing loss, unspecified ear: Secondary | ICD-10-CM

## 2022-05-26 DIAGNOSIS — Z6827 Body mass index (BMI) 27.0-27.9, adult: Secondary | ICD-10-CM

## 2022-05-26 DIAGNOSIS — Z9889 Other specified postprocedural states: Secondary | ICD-10-CM

## 2022-05-26 DIAGNOSIS — B353 Tinea pedis: Secondary | ICD-10-CM

## 2022-05-26 DIAGNOSIS — Z823 Family history of stroke: Secondary | ICD-10-CM

## 2022-05-26 DIAGNOSIS — D489 Neoplasm of uncertain behavior, unspecified: Secondary | ICD-10-CM

## 2022-05-26 DIAGNOSIS — R001 Bradycardia, unspecified: Secondary | ICD-10-CM

## 2022-05-26 DIAGNOSIS — I10 Essential (primary) hypertension: Secondary | ICD-10-CM

## 2022-05-26 DIAGNOSIS — H818X9 Other disorders of vestibular function, unspecified ear: Secondary | ICD-10-CM

## 2022-05-26 DIAGNOSIS — R5383 Other fatigue: Secondary | ICD-10-CM

## 2022-05-26 DIAGNOSIS — Z0000 Encounter for general adult medical examination without abnormal findings: Secondary | ICD-10-CM

## 2022-05-26 DIAGNOSIS — E039 Hypothyroidism, unspecified: Secondary | ICD-10-CM

## 2022-05-26 DIAGNOSIS — J302 Other seasonal allergic rhinitis: Secondary | ICD-10-CM

## 2022-05-26 DIAGNOSIS — R519 Generalized headaches: Secondary | ICD-10-CM

## 2022-05-26 DIAGNOSIS — L821 Other seborrheic keratosis: Secondary | ICD-10-CM

## 2022-05-26 DIAGNOSIS — Z7901 Long term (current) use of anticoagulants: Secondary | ICD-10-CM

## 2022-05-26 DIAGNOSIS — G959 Disease of spinal cord, unspecified: Secondary | ICD-10-CM

## 2022-05-26 DIAGNOSIS — M1990 Unspecified osteoarthritis, unspecified site: Secondary | ICD-10-CM

## 2022-05-26 DIAGNOSIS — M1712 Unilateral primary osteoarthritis, left knee: Secondary | ICD-10-CM

## 2022-05-26 DIAGNOSIS — I4891 Unspecified atrial fibrillation: Secondary | ICD-10-CM

## 2022-05-26 DIAGNOSIS — G3189 Other specified degenerative diseases of nervous system: Secondary | ICD-10-CM

## 2022-05-26 DIAGNOSIS — B351 Tinea unguium: Secondary | ICD-10-CM

## 2022-05-26 DIAGNOSIS — N401 Enlarged prostate with lower urinary tract symptoms: Secondary | ICD-10-CM

## 2022-05-26 DIAGNOSIS — E785 Hyperlipidemia, unspecified: Secondary | ICD-10-CM

## 2022-05-26 DIAGNOSIS — B079 Viral wart, unspecified: Secondary | ICD-10-CM

## 2022-05-26 DIAGNOSIS — I5032 Chronic diastolic (congestive) heart failure: Secondary | ICD-10-CM

## 2022-05-26 DIAGNOSIS — E7800 Pure hypercholesterolemia, unspecified: Secondary | ICD-10-CM

## 2022-05-26 DIAGNOSIS — I480 Paroxysmal atrial fibrillation: Secondary | ICD-10-CM

## 2022-05-26 DIAGNOSIS — F5221 Male erectile disorder: Secondary | ICD-10-CM

## 2022-05-26 DIAGNOSIS — Z950 Presence of cardiac pacemaker: Secondary | ICD-10-CM

## 2022-05-26 DIAGNOSIS — L570 Actinic keratosis: Secondary | ICD-10-CM

## 2022-05-26 DIAGNOSIS — L578 Other skin changes due to chronic exposure to nonionizing radiation: Secondary | ICD-10-CM

## 2022-05-26 DIAGNOSIS — M25569 Pain in unspecified knee: Secondary | ICD-10-CM

## 2022-05-26 DIAGNOSIS — L304 Erythema intertrigo: Secondary | ICD-10-CM

## 2022-05-26 DIAGNOSIS — I495 Sick sinus syndrome: Secondary | ICD-10-CM

## 2022-05-26 DIAGNOSIS — R42 Dizziness and giddiness: Secondary | ICD-10-CM

## 2022-05-26 MED ORDER — SILDENAFIL 50 MG PO TAB
50 mg | ORAL_TABLET | ORAL | 6 refills | 45.00000 days | Status: AC | PRN
Start: 2022-05-26 — End: ?
  Filled 2022-05-26: qty 3, 30d supply, fill #1

## 2022-05-26 NOTE — Progress Notes
Jonathan Humphrey was seen today for a hearing aid consult.    We are going to order Phonak Audeo P50 hearing aids. He did not want the rechargeable option so I will order the 312 style with custom molds.    We will call him when the hearing aids arrive to schedule a fitting.
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
## 2022-05-26 NOTE — Progress Notes
Date of Service: 05/26/2022    Jonathan Humphrey is a 88 y.o. male.  DOB: 1934-12-18  MRN: [id number]     SUBJECTIVE       He presents today for an Annual Medicare Wellness visit.   Chief Complaint   Patient presents with   ? Wellness   ? Physical     Patient answers are not available for this visit.      Problem List Review     I have reviewed and updated the problem list below and addressed acute and chronic conditions with patient or recommended a follow up plan.   Patient Active Problem List    Diagnosis   ? S/P left atrial appendage ligation   ? Encounter for monitoring sotalol therapy   ? Chronic heart failure with preserved ejection fraction (HCC)   ? Balance problem   ? 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 Head trauma, initial encounter   ? Carpal tunnel syndrome of left wrist   ? PTSD (post-traumatic stress disorder)   ? Benign prostatic hyperplasia with lower urinary tract symptoms     Chronic   ? Shoulder arthritis     Chronic   ? Trigger middle finger of right hand     Chronic   ? Paroxysmal ventricular tachycardia (HCC)   ? Right inguinal hernia   ? Cardiac pacemaker in situ     Chronic   ? 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 BMI 31.0-31.9,adult     Chronic   ? Primary osteoarthritis of left knee     Chronic   ? Visit for preventive health examination   ? AV block, 2nd degree   ? Leg cramps, sleep related   ? Coronary artery disease, non-occlusive     Chronic   ? HLD (hyperlipidemia)     Chronic   ? Essential hypertension     Chronic   ? GERD (gastroesophageal reflux disease)     Chronic   ? Sensorineural hearing loss   ? Erectile dysfunction of non-organic origin   ? Hypothyroid     Chronic          Risk Review   Lab Draw:  Lab Results   Component Value Date/Time    HGBA1C 5.8 10/30/2021 11:36 AM    HGBA1C 5.6 04/30/2021 10:41 AM    HGBA1C 5.8 12/17/2020 12:00 AM     POC:  No results found for: A1C    Lab Results   Component Value Date CHOL 109 03/03/2022     The ASCVD Risk score (Arnett DK, et al., 2019) failed to calculate for the following reasons:    The 2019 ASCVD risk score is only valid for ages 37 to 71  Preventive Medications   Statin discussion: I have reviewed the patient's ASCVD risk and patient: Patient is on statin    Aspirin discussion:  Patient has history of cardiovascular disease and after risk-benefit discussion: patient is on aspirin and will continue.          Preventive Screening   I reviewed the health maintenance tab with the patient and verified all proof of screenings are present in the chart and ordered outside records as appropriate.    Health Maintenance   Topic Date Due   ? MEDICARE ANNUAL WELLNESS VISIT  08/22/2021   ? ADVANCED CARE PLANNING DISCUSSION AND DOCUMENTATION  12/21/2021   ? PHYSICAL (COMPREHENSIVE) EXAM  12/24/2021   ?  DTAP/TDAP VACCINES (2 - Td or Tdap) 08/26/2026   ? COVID-19 VACCINE  Completed   ? SHINGLES RECOMBINANT VACCINE  Completed   ? PNEUMOCOCCAL VACCINE 65+ YRS  Completed   ? DEPRESSION SCREENING  Completed   ? INFLUENZA VACCINE  Completed       Immunizations     ? COVID vaccine: up to date    ? Tdap: Up-to-date, records in chart  ? Pneumonia vaccine: Up-to-date, records in chart  ? Shingles: Up-to-date, records in chart  ? Influenza: Up-to-date    Health Risk Assessment Questionnaire     The patient completed a health risk assessment with results reviewed and addressed with patient.    Health Risk Assessment Questionnaire  Current Care  List of Providers you have seen in the last two years: Dr [name];  Are you receiving home health?: No  During the past 4 weeks, how would you rate your health in general?: Very Good    Outside Care  Since your last PCP visit, have you received care outside of [facility]?: No    Physical Activity  Do you exercise or are you physically active?: Yes  How many days a week do you usually exercise or are physically active?: 7  On days when you exercised or were physically active, how many minutes was the activity?: 120  During the past four weeks, what was the hardest physical activity you could do for at least two minutes?: Moderate    Diet  In the past month, were you worried whether your food would run out before you or your family had money to buy more?: No  In the past 7 days, how many times did you eat fast food or junk food or pizza?: 0  In the past 7 days, how many servings of fruits or vegetables did you eat each day?: (!) 2-3  In the past 7 days, how many sodas and sugar sweetened drinks (regular, not diet) did you drink each day?: 0    Smoke/Tobacco Use  Are you currently a smoker?: No    Alcohol Use  Do you drink alcohol?: Yes  Are you Male or Male?: Male  Male: In the last three months, have you had >3 alcoholic beverages in any one day or >14 in any one week?: No    Depression Screen  Little interest or pleasure in doing things: Not at All  Feeling down, depressed or hopeless: (!) Nearly Every Day    Pain  How would you rate your pain today?: No pain    Ambulation  Do you use any assistive devices for ambulation?: No    Fall Risk  Does it take you longer than 30 seconds to get up and out of a chair?: No  Have you fallen in the past year?: No  Fall History (last 35mo): No Falls    Motor Vehicle Safety  Do you fasten your seat belt when you are in the car?: (!) No    Sun Exposure  Do you protect yourself from the sun? For example, wear sunscreen when outside.: Yes    Hearing Loss  Do you have trouble hearing the television or radio when others do not?: (!) Yes  Do you have to strain or struggle to hear/understand conversation?: (!) Yes  Do you use hearing aids?: No    Cognitive Impairment  During the past 12 months, have you experienced confusion or memory loss that is happening more often or is  getting worse?: (!) Yes    Functional Screen  Do you live alone?: Yes  Do you live at: Home  Can you drive your own car or travel alone by bus or taxi?: Yes  Can you shop for groceries or clothes without help?: Yes  Can you prepare your own meals?: Yes  Can you do your own housework without help?: Yes  Can you handle your own money without help?: Yes  Do you need help eating, bathing, dressing, or getting around your home?: No  Do you feel safe?: Yes  Does anyone at home hurt you, hit you, or threaten you?: No  Have you ever been the victim of abuse?: No    Home Safety  Does your home have grab bars in the bathroom?: Yes  Does your home have hand rails on stairs and steps?: Yes  Does your home have functioning smoke alarms?: (!) No    Advance Directive  Do you have a living will or Advance Directive?: Yes    Dental Screen  Have you had an exam by your dentist in the last year?: Yes    Vision Screen  Do you have diabetes?: No  When was your last eye exam?: 06/04/21  Eye Doctor Facility?: [facility]    Urinary Incontinence  Have you had urine leakage in the past 6 months?: (!) Yes  Does your urine leakage negatively impact your daily activities or sleep?: No    Social Determinants of Health     Social Determinants of Health with Concerns     Stress: Not on file   Depression: At risk (05/26/2022)    PHQ-2    ? PHQ-2 Score: 3       History Review     Past Medical History:   Diagnosis Date   ? AK (actinic keratosis)     scalp   ? Arthritis    ? Atrial fibrillation (HCC) 01/16/2009   ? Chronic anticoagulation 01/16/2009   ? Coronary atherosclerosis 09/01/2006    Coronary artery disease   ? Cyst     right upper back   ? Dizziness 01/19/2007   ? 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 Essential hypertension 04/11/2009   ? Family history of cerebrovascular accident (CVA) 01/19/2007   ? Fatigue 07/21/2007   ? Generalized headaches    ? GERD (gastroesophageal reflux disease) 12/30/2006   ? Hearing loss 01/19/2007   ? HLD (hyperlipidemia) 04/11/2009   ? Hyperlipidemia 09/01/2006   ? Hypertension, essential 09/01/2006   ? Hypothyroidism 11/30/2007   ? Intertrigo    ? Knee pain 12/30/2006   ? Neoplasm of uncertain behavior    ? Onychomycosis    ? PAF (paroxysmal atrial fibrillation) (HCC) 04/11/2009   ? Photoaging of skin    ? S/P left atrial appendage ligation 04/04/2022   ? Seasonal allergic reaction    ? Sinus bradycardia    ? SK (seborrheic keratosis)     face, scalp, right ear   ? SSS (sick sinus syndrome) (HCC)    ? Tinea pedis    ? Verruca vulgaris      Past Surgical History:   Procedure Laterality Date   ? CARDIOVERSION  03/11/2009   ? CORONARY ANGIOPLASTY  05/1994    [facility], no stents   ? 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 INGUINAL HERNIA REPAIR Bilateral 01/13/2016    REPAIR HERNIA INGUINAL performed by Simonne Martinet,  MD at [facility]   ? KNEE REPLACEMENT Right 10/09/14   ? LEFT HEART CATHETERIZATION  05/1994    no stents   ? SKIN BIOPSY  09/24/2006    shave biopsy   ? SKIN LESION REMOVAL  2018    3 spots on face by outside facility   ? UMBILICAL HERNIA REPAIR N/A 01/13/2016    REPAIR HERNIA UMBILICAL performed by Simonne Martinet, MD at [facility]      reports that he has never smoked. He has never used smokeless tobacco. He reports current alcohol use of about 7.0 standard drinks of alcohol per week. He reports that he does not use drugs.  Family History   Problem Relation Age of Onset   ? High Cholesterol Mother    ? Hypertension Mother    ? Stroke Father    ? Cancer Father    ? Blindness Neg Hx    ? Glaucoma Neg Hx    ? Macular Degen Neg Hx      Vaping/E-liquid Use   ? Vaping Use Never User               No Known Allergies  Review of Systems         Review of Systems    OBJECTIVE     Vitals:    05/26/22 0910 05/26/22 0913   BP: (!) 141/84 (!) 141/79   BP Source: Arm, Left Upper Arm, Left Upper   Pulse: 60 60   Temp: 36.6 ?C (97.9 ?F)    Resp: 16    SpO2: 96%    TempSrc: Oral    PainSc: Zero    Weight: 85.5 kg (188 lb 9.6 oz)    Height: 175.3 cm (5' 9.02)      Body mass index is 27.84 kg/m?Marland Kitchen   Physical Exam Mini-Cog  Steps:   1. Tell patient 3 words.   2. Patient draws clock at 11:10: Normal clock, 2 points  3. Patient recalls words: Recalled 2 words, 2 points  Score and follow up: 3-5 points - lower risk of dementia, referral on case-by-case basis    Get-up-and-go Test  Less than 12 seconds, normal    Medications   I have completed a medication reconciliation, discussed medication adherence, and reviewed the list for high-risk medications (BEERS) and results are below:     ? aspirin EC 81 mg tablet Take 1 tablet by mouth daily. Take with food.   ? atorvastatin (LIPITOR) 40 mg tablet Take one tablet by mouth daily.   ? calcium carbonate/vitamin D3 (VITAMIN D-3 PO) Take  by mouth.   ? cholecalciferol (VITAMIN D3) 50 mcg (2,000 unit) tablet Take one tablet by mouth daily.   ? cyanocobalamin (VITAMIN B-12) 1,000 mcg tablet Take one tablet by mouth daily. Start this the day after taking your one time Vitamin B12 injection.   ? escitalopram oxalate (LEXAPRO) 10 mg tablet    ? evolocumab (REPATHA SURECLICK) 140 mg/mL injectable PEN Inject 1 mL under the skin every 14 days.   ? levothyroxine (SYNTHROID) 50 mcg tablet Take one tablet by mouth daily.   ? lisinopriL (ZESTRIL) 20 mg tablet Take one tablet by mouth daily.   ? OMEGA-3 FATTY ACIDS/FISH OIL (FISH OIL-OMEGA-3 FATTY ACIDS PO) Take 4 tablets by mouth daily.   ? sotaloL (BETAPACE) 120 mg tablet TAKE 1 TABLET BY MOUTH TWICE DAILY          ASSESSMENT AND PLAN       Personal  prevention plan reviewed with patient and is accessible via patient's After Visit Summary and visit note.    There are no diagnoses linked to this encounter.  No orders of the defined types were placed in this encounter.    There are no discontinued medications.  There are no Patient Instructions on file for this visit.
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
## 2022-06-03 ENCOUNTER — Encounter: Admit: 2022-06-03 | Discharge: 2022-06-03 | Payer: MEDICARE

## 2022-06-03 ENCOUNTER — Ambulatory Visit: Admit: 2022-06-03 | Discharge: 2022-06-03 | Payer: MEDICARE

## 2022-06-03 DIAGNOSIS — B079 Viral wart, unspecified: Secondary | ICD-10-CM

## 2022-06-03 DIAGNOSIS — E039 Hypothyroidism, unspecified: Secondary | ICD-10-CM

## 2022-06-03 DIAGNOSIS — I495 Sick sinus syndrome: Secondary | ICD-10-CM

## 2022-06-03 DIAGNOSIS — Z950 Presence of cardiac pacemaker: Secondary | ICD-10-CM

## 2022-06-03 DIAGNOSIS — Z823 Family history of stroke: Secondary | ICD-10-CM

## 2022-06-03 DIAGNOSIS — J302 Other seasonal allergic rhinitis: Secondary | ICD-10-CM

## 2022-06-03 DIAGNOSIS — L578 Other skin changes due to chronic exposure to nonionizing radiation: Secondary | ICD-10-CM

## 2022-06-03 DIAGNOSIS — H9190 Unspecified hearing loss, unspecified ear: Secondary | ICD-10-CM

## 2022-06-03 DIAGNOSIS — E7800 Pure hypercholesterolemia, unspecified: Secondary | ICD-10-CM

## 2022-06-03 DIAGNOSIS — R2689 Other abnormalities of gait and mobility: Secondary | ICD-10-CM

## 2022-06-03 DIAGNOSIS — R5383 Other fatigue: Secondary | ICD-10-CM

## 2022-06-03 DIAGNOSIS — L304 Erythema intertrigo: Secondary | ICD-10-CM

## 2022-06-03 DIAGNOSIS — D489 Neoplasm of uncertain behavior, unspecified: Secondary | ICD-10-CM

## 2022-06-03 DIAGNOSIS — Z7901 Long term (current) use of anticoagulants: Secondary | ICD-10-CM

## 2022-06-03 DIAGNOSIS — F5221 Male erectile disorder: Secondary | ICD-10-CM

## 2022-06-03 DIAGNOSIS — R001 Bradycardia, unspecified: Secondary | ICD-10-CM

## 2022-06-03 DIAGNOSIS — E785 Hyperlipidemia, unspecified: Secondary | ICD-10-CM

## 2022-06-03 DIAGNOSIS — R42 Dizziness and giddiness: Secondary | ICD-10-CM

## 2022-06-03 DIAGNOSIS — R0989 Other specified symptoms and signs involving the circulatory and respiratory systems: Secondary | ICD-10-CM

## 2022-06-03 DIAGNOSIS — I480 Paroxysmal atrial fibrillation: Secondary | ICD-10-CM

## 2022-06-03 DIAGNOSIS — B351 Tinea unguium: Secondary | ICD-10-CM

## 2022-06-03 DIAGNOSIS — R519 Generalized headaches: Secondary | ICD-10-CM

## 2022-06-03 DIAGNOSIS — I4891 Unspecified atrial fibrillation: Secondary | ICD-10-CM

## 2022-06-03 DIAGNOSIS — I10 Essential (primary) hypertension: Secondary | ICD-10-CM

## 2022-06-03 DIAGNOSIS — B353 Tinea pedis: Secondary | ICD-10-CM

## 2022-06-03 DIAGNOSIS — I2510 Atherosclerotic heart disease of native coronary artery without angina pectoris: Secondary | ICD-10-CM

## 2022-06-03 DIAGNOSIS — K219 Gastro-esophageal reflux disease without esophagitis: Secondary | ICD-10-CM

## 2022-06-03 DIAGNOSIS — M25569 Pain in unspecified knee: Secondary | ICD-10-CM

## 2022-06-03 DIAGNOSIS — Z9889 Other specified postprocedural states: Secondary | ICD-10-CM

## 2022-06-03 DIAGNOSIS — L821 Other seborrheic keratosis: Secondary | ICD-10-CM

## 2022-06-03 DIAGNOSIS — M1990 Unspecified osteoarthritis, unspecified site: Secondary | ICD-10-CM

## 2022-06-03 DIAGNOSIS — L570 Actinic keratosis: Secondary | ICD-10-CM

## 2022-06-03 NOTE — Patient Instructions
Establish care with Dr. Rolm Bookbinder    Follow up with Dr. [name] in 6 months    Monitor BP at home    Follow-Up:    -Thank you for allowing me to take care of you today. My name is Meryle Ready, Charity fundraiser.     The schedule is released approximately 4-5 months in advance. You should be called or mailed to make an appointment, however if you would like to call us to make this appt, please call [phone].    -Please schedule the following testing at check out, or by calling our scheduling line at [phone]    -You will receive a survey in the upcoming week from [facility]. Your feedback is important to [place], and helps [place] continue to improve patient care and patient satisfaction.     Contacting our office:    -For NON-URGENT questions please contact us through your MyChart account.   -For all medication refills please contact your pharmacy or send a request through MyChart.     -For all questions that may need to be addressed urgently please call the nursing triage line at [phone] Monday - Friday 8-5 only. Please leave a detailed message with your name, date of birth, and reason for your call.  As long as you call before 3:30pm, you will receive a call back the same day. Please allow time for [place] to review your chart prior to call back.     -Our fax number is [phone].    -Should you have an urgent concern over the weekend/nights that you do not believe warrants a visit to the emergency room, please contact [phone] for our on-call team.    Results & Testing Follow Up:    -Please allow 10-15 business days for the results of any testing to be reviewed. Please call our office if you have not heard from a nurse within this time frame.    -Should you choose to complete testing at an outside facility, please contact our office after completion of testing so that we can ensure that we have received results.    Lab and test results:  As a part of the CARES act, starting 03/21/2020, some results will be released to you via mychart immediately and automatically.  You may see results before your provider sees them; however, your provider will review all these results and then they, or one of their team, will notify you of result information and recommendations.   Critical results will be addressed immediately, but otherwise, please allow [place] time to get back with you prior to you reaching out to [place] for questions.  This will usually take about 72 hours for labs and 5-7 days for procedure test results.      We know you have a choice and want to thank you for choosing [facility].

## 2022-06-03 NOTE — Progress Notes
Date of Service: 06/03/2022    Jonathan Humphrey is a 88 y.o. male.       HPI  Jonathan Humphrey was seen in the office today in electrophysiology follow up. As you may know, he is a 88 y.o. 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  He does have paroxysmal atrial fibrillation and known coronary artery disease with hypertension, hyperlipidemia, and hypothyroidism.  He does follow with Dr. Marya Amsler and underwent sotalol initiation for treatment of nonsustained VT in early 2022.  He has done well since that time.  He last saw her in August of last year and did have good control of VTE.  He has historically followed with Dr. Vivianne Spence who recently retired from [employer].  He is requesting referral to new cardiologist.  He presents today for routine follow-up.    Jonathan Humphrey reports doing fairly well from a cardiac standpoint since his last visit.  He has been battling with some balance issues and is being referred for physical therapy regarding his disequilibrium.  He did recently have skin cancer removed from his left neck with dressing in place today.  He has been diagnosed with sciatica and recently placed on Medrol Dosepak as well.  He does not routinely check his blood pressure at home but states that he has the ability to do so.      Vitals:    06/03/22 0940   BP: (!) 150/84   BP Source: Arm, Left Upper   Pulse: 61   PainSc: Zero   Weight: 84.4 kg (186 lb)   Height: 172.7 cm (5' 8)     Body mass index is 28.28 kg/m?Marland Kitchen     Past Medical History  Patient Active Problem List    Diagnosis Date Noted   ? Disease of spinal cord (HCC) 05/26/2022   ? S/P left atrial appendage ligation 04/04/2022     10/02/2021 - TEE:  Post left atrial appendage ligation.  No residual tissue or communication seen via color-flow Doppler in the typical location of the left atrial appendage. This suggests there is no residual left atrial appendage.  Limited views of left ventricle suggests that it is normal in size with preserved systolic function.  Estimated ejection fraction=60%.  Right ventricle is normal in size with preserved systolic function.  Left atrium is dilated.   Significant mitral annular calcification.  No significant stenosis.  Mean transmitral diastolic pressure gradient 3 mm Hg at 71 bpm.  Mild mitral valve regurgitation.  Mild to moderate tricuspid valve regurgitation.  Moderate calcific atheromatous plaque in the descending thoracic aorta.  No pericardial effusion.       ? Chronic heart failure with preserved ejection fraction (HCC) 07/07/2021   ? Balance problem 06/24/2021   ? 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 Head trauma, initial encounter 10/20/2018   ? Carpal tunnel syndrome of left wrist 07/05/2018   ? PTSD (post-traumatic stress disorder) 07/05/2018   ? Benign prostatic hyperplasia with lower urinary tract symptoms 06/03/2017   ? Shoulder arthritis 04/05/2017   ? Trigger middle finger of right hand 12/09/2016   ? Paroxysmal ventricular tachycardia (HCC) 08/13/2016     06/07/2019 - ECHO:  Normal left ventricular size and systolic function.  LVEF 65%.  There is paradoxical septal wall motion abnormality.  Normal right ventricular size and systolic function.  Pacer lead was  visualized in the right ventricle.  No hemodynamically significant valve disease.  There is moderate mitral annular calcification.  No pericardial effusion.  Mildly dilated sinus of Valsalva.  02/11/2020 - ECHO:  Normal left ventricular size. Prominent basal septum. No outflow tract obstruction. Normal systolic function. Estimated EF ~65%.  Abnormal septal wall motion consistent with right ventricular pacing.  Unable to assess diastolic function.  Normal right ventricular size and systolic function.  The left atrium is mildly dilated.  Normal right atrial size.  Device leads in right-sided chambers.  Moderate calcification of the mitral valve annulus with extension into the leaflets.  Very mild stenosis.  Mean gradient 3-4 mmHg at a heart rate of 78 bpm.  Mild regurgitation.  Aortic valve sclerosis without stenosis.  Trivial regurgitation.  Mild to moderate tricuspid valve regurgitation.  No pericardial effusion.  Estimated pulmonary artery systolic pressure 26 mmHg.   The aortic root is dilated, measuring 3.8 cm at the sinus of Valsalva.  04/30/2021 - ECHO:  A septal wall motion abnormality is noted consistent with right ventricular pacing. ?No other regional wall motion abnormalities are identified. Overall left ventricular systolic function appears normal. The estimated left ventricular ejection fraction is 60%. Left ventricular contractility appears similar when compared with the prior echocardiogram performed on 12/04/20.   Mild basal septal hypertrophy is noted. The Doppler recording obtained from the left ventricular outflow tract has normal velocity and profile and does not suggest resting left ventricular outflow tract obstruction.  Grade II (moderate) left ventricular diastolic dysfunction. Elevated left atrial pressure.  The right ventricle is not visualized well. Limited views suggest normal right ventricular size and contractility.  Mild left atrial enlargement.  Aortic valve sclerosis without stenosis.  No pericardial effusion is seen.       ? Right inguinal hernia 12/06/2015   ? Cardiac pacemaker in situ 10/18/2015     ? 10/21/15 [employer] dual-chamber PPM implantation - Dr. Naoma Diener     ? 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 BMI 31.0-31.9,adult 08/10/2015   ? Primary osteoarthritis of left knee 06/04/2015   ? Visit for preventive health examination 09/05/2012   ? AV block, 2nd degree 07/14/2010   ? Leg cramps, sleep related 04/10/2010   ? 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 Micheline Rough: 10-20% prox-CFX, 20-30% prox-RCA  06/08/2019 - CCTA:  Extensive coronary artery calcifications making the accurate estimation of stenosis difficult.  Normal coronary artery system origins and course. Right dominant system.  The left anterior descending artery with extensive coronary artery calcification in the proximal to mid segments severe stenosis cannot be excluded. The left anterior descending artery is patent in the mid to distal segments and not well-visualized apically. Study will be assessed by FFR to evaluate hemodynamically.  The left circumflex artery has mild to moderate calcific atherosclerotic changes without obvious stenosis.  The right coronary artery has mild to moderate calcific atherosclerotic changes with mild stenosis proximally. The PDA is not visualized distally.   12/04/2020 - ECHO:  Technically difficult study; i.v. transpulmonary contrast was used to define the endocardial borders.  Normal left ventricular systolic function, estimated ejection fraction is 65%.  Abnormal septal motion consistent with right ventricular pacing.  Grade III (severe) left ventricular diastolic dysfunction. Elevated left atrial pressure.  The right ventricular size, wall thickness and systolic function are normal.  Left Atrium: Mildly dilated.  Severe mitral annular calcification, the mitral valve is thickened and calcified, mild stenosis,  MG =3 mmHg, trace regurgitation.  Mild tricuspid valve regurgitation.  Estimated Peak Systolic PA Pressure 24 mmHg.         ? HLD (hyperlipidemia) 04/11/2009   ? Essential hypertension 04/11/2009   ? GERD (gastroesophageal reflux disease) 04/11/2009   ? Sensorineural hearing loss 04/11/2009   ? Erectile dysfunction of non-organic origin 04/11/2009   ? Hypothyroid 04/11/2009         Review of Systems   Constitutional: Negative.   HENT: Negative.    Eyes: Negative.    Cardiovascular: Negative.    Respiratory: Negative.    Endocrine: Negative.    Hematologic/Lymphatic: Negative.    Skin: Negative.    Musculoskeletal: Negative.    Gastrointestinal: Negative.    Genitourinary: Negative.    Neurological: Negative.    Psychiatric/Behavioral: Negative.    Allergic/Immunologic: Negative.        Physical Exam   Constitutional: He appears well-developed.   elderly   HENT:   Head: Normocephalic and atraumatic.   Eyes: Conjunctivae are normal.   Cardiovascular: Normal rate, regular rhythm and normal heart sounds.   Pulmonary/Chest: Effort normal and breath sounds normal.   Abdominal:   Obese and protuberant    Neurological: He is alert and oriented to person, place, and time.   Skin: Skin is warm and dry.   Psychiatric: His behavior is normal.     Cardiovascular Studies  ECG in office today shows normal sinus rhythm with RV pacing at 61 bpm.    Pacemaker interrogation in office today shows normal device function.  He is a paced 21% of the time and RV paced 99%.  Underlying rhythm is sinus in the 60s with complete heart block.  He has no escape rhythm at 30 bpm.  Battery life is stable with 6-1/2 years remaining.    Cardiovascular Health Factors  Vitals BP Readings from Last 3 Encounters:   06/03/22 (!) 150/84   05/26/22 (!) 141/79   03/04/22 132/70     Wt Readings from Last 3 Encounters:   06/03/22 84.4 kg (186 lb)   05/26/22 85.5 kg (188 lb 9.6 oz)   03/04/22 87.6 kg (193 lb 3.2 oz)     BMI Readings from Last 3 Encounters:   06/03/22 28.28 kg/m?   05/26/22 27.84 kg/m?   03/04/22 28.53 kg/m?      Smoking Social History     Tobacco Use   Smoking Status Never   Smokeless Tobacco Never   Vaping Use   ? Vaping Use: Never used      Lipid Profile Cholesterol   Date Value Ref Range Status   03/03/2022 109  Final     HDL   Date Value Ref Range Status   03/03/2022 86  Final     LDL   Date Value Ref Range Status   03/03/2022 11  Final     Triglycerides   Date Value Ref Range Status   03/03/2022 61  Final      Blood Sugar Hemoglobin A1C   Date Value Ref Range Status   10/30/2021 5.8 4.0 - 6.0 % Final Comment:     The ADA recommends that most patients with type 1 and type 2 diabetes maintain   an A1c level <7%.       Glucose   Date Value Ref Range Status   11/24/2021 107  Final   09/15/2021 96 70 - 100 MG/DL Final   04/54/0981 191 (H) 70 - 100 MG/DL Final   47/82/9562  94 70 - 110 MG/DL Final   16/09/9603 540 (H) 70 - 110 MG/DL Final   98/10/9146 96 70 - 110 MG/DL Final     Glucose Fasting   Date Value Ref Range Status   10/30/2021 100 70 - 100 MG/DL Final     Glucose, POC   Date Value Ref Range Status   05/18/2010 89 70 - 100 MG/DL Final   82/95/6213 086 (H) 70 - 100 MG/DL Final   57/84/6962 952 (H) 70 - 100 MG/DL Final          Problems Addressed Today  Encounter Diagnoses   Name Primary?   ? Cardiovascular symptoms Yes   ? Cardiac pacemaker in situ    ? Coronary artery disease, non-occlusive    ? Pure hypercholesterolemia    ? Essential hypertension    ? 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 S/P left atrial appendage ligation    ? Hypothyroidism, unspecified type    ? Balance problem        Assessment and Plan  Jonathan Humphrey appears to be doing well from an electrophysiology standpoint over the last 6 to 9 months.  Pacemaker is functioning appropriately and he continues to RV paced 99% of the time.  He is tolerating sotalol with stable QTc and renal function was found to be within normal limits back in August.  He has not had any episodes of sustained VT on pacemaker interrogation.  Blood pressure is elevated somewhat in our office today and I have asked that he begin monitoring this at home over the next few weeks and notify us for readings consistently greater than 130/80.    I have referred him for new patient consultation with Dr. Guadelupe Sabin pulling to establish general cardiology care and we will plan to have him return to see Dr. [name] in 6 months time.  We will continue to follow his device remotely from home in the interim.  He verbalized good understanding and agreement with this plan.    Jene Every, NP-C         Current Medications (including today's revisions)  ? aspirin EC 81 mg tablet Take 1 tablet by mouth daily. Take with food.   ? atorvastatin (LIPITOR) 40 mg tablet Take one tablet by mouth daily.   ? CHOLEcalciferoL (vitamin D3) 1,000 units tablet Take one tablet by mouth daily.   ? cyanocobalamin (VITAMIN B-12) 1,000 mcg tablet Take one tablet by mouth daily. Start this the day after taking your one time Vitamin B12 injection.   ? escitalopram oxalate (LEXAPRO) 10 mg tablet Take one tablet by mouth daily.   ? evolocumab (REPATHA SURECLICK) 140 mg/mL injectable PEN Inject 1 mL under the skin every 14 days.   ? levothyroxine (SYNTHROID) 50 mcg tablet Take one tablet by mouth daily.   ? lisinopriL (ZESTRIL) 20 mg tablet Take one tablet by mouth daily.   ? OMEGA-3 FATTY ACIDS/FISH OIL (FISH OIL-OMEGA-3 FATTY ACIDS PO) Take 4 tablets by mouth daily.   ? sotaloL (BETAPACE) 120 mg tablet TAKE 1 TABLET BY MOUTH TWICE DAILY

## 2022-06-05 ENCOUNTER — Encounter: Admit: 2022-06-05 | Discharge: 2022-06-05 | Payer: MEDICARE

## 2022-06-09 ENCOUNTER — Encounter: Admit: 2022-06-09 | Discharge: 2022-06-09 | Payer: MEDICARE

## 2022-06-09 DIAGNOSIS — R2689 Other abnormalities of gait and mobility: Secondary | ICD-10-CM

## 2022-06-09 NOTE — Telephone Encounter
Referral needs to be signed by NP and faxed to [facility].

## 2022-06-09 NOTE — Telephone Encounter
Pt LVM requesting call back because he had appt w/ENT who recommended he speak to PCP regarding therapy needed in [facility].    Upon review of chart OV from 05/26/22 notes state:    1. A trial of vestibular rehabilitation and balance therapy is recommended to quantify benefit attributable to vestibular rehabilitation, and for fall-prevention (anticipatory and reactive balance), gaze stability, and balance exercises. [facility] in [place] has had trained vestibular physical therapists in the past and may be more accessible for Mr. Horsey.     Notified pt that I would route to [facility] to see if she can write referral for me to fax.  Pt stated that he would try to get fax number & get back with me.    Routing to [place] to advise.

## 2022-06-09 NOTE — Telephone Encounter
Vestibular rehab ordered- please fax for him

## 2022-06-11 ENCOUNTER — Encounter: Admit: 2022-06-11 | Discharge: 2022-06-11 | Payer: MEDICARE

## 2022-06-11 DIAGNOSIS — R2689 Other abnormalities of gait and mobility: Secondary | ICD-10-CM

## 2022-06-19 ENCOUNTER — Encounter: Admit: 2022-06-19 | Discharge: 2022-06-19 | Payer: MEDICARE

## 2022-06-22 ENCOUNTER — Encounter: Admit: 2022-06-22 | Discharge: 2022-06-22 | Payer: MEDICARE

## 2022-06-25 ENCOUNTER — Encounter: Admit: 2022-06-25 | Discharge: 2022-06-25 | Payer: MEDICARE

## 2022-06-25 NOTE — Telephone Encounter
He stated he went to have an MRI of the pelvis at the small local hospital  And they discovered he had a PM  They refused to do the MRI and set him up for a CT scan  He wanted to know if he could have MRIs  Informed him he does have MRI conditional system and yes he can have MRIs  BUT, he would need to have device rep/staff interrogate and program MRI mode with DDD reprogramming since he has CH[employer]  He verbalized understanding and thanked [place] for the information    He has his [employer] device card and it has MRI conditional on it also    Also told him, the hospital or facility where he is having MRI can fax [place] information and we can fax back to them recommendations

## 2022-06-30 ENCOUNTER — Encounter: Admit: 2022-06-30 | Discharge: 2022-06-30 | Payer: MEDICARE

## 2022-07-02 ENCOUNTER — Encounter: Admit: 2022-07-02 | Discharge: 2022-07-02 | Payer: MEDICARE

## 2022-07-07 ENCOUNTER — Ambulatory Visit: Admit: 2022-07-07 | Discharge: 2022-07-07 | Payer: MEDICARE

## 2022-07-07 ENCOUNTER — Encounter: Admit: 2022-07-07 | Discharge: 2022-07-07 | Payer: MEDICARE

## 2022-07-07 DIAGNOSIS — H9190 Unspecified hearing loss, unspecified ear: Secondary | ICD-10-CM

## 2022-07-07 DIAGNOSIS — B079 Viral wart, unspecified: Secondary | ICD-10-CM

## 2022-07-07 DIAGNOSIS — F5221 Male erectile disorder: Secondary | ICD-10-CM

## 2022-07-07 DIAGNOSIS — I495 Sick sinus syndrome: Secondary | ICD-10-CM

## 2022-07-07 DIAGNOSIS — I10 Essential (primary) hypertension: Secondary | ICD-10-CM

## 2022-07-07 DIAGNOSIS — L578 Other skin changes due to chronic exposure to nonionizing radiation: Secondary | ICD-10-CM

## 2022-07-07 DIAGNOSIS — R5383 Other fatigue: Secondary | ICD-10-CM

## 2022-07-07 DIAGNOSIS — R42 Dizziness and giddiness: Secondary | ICD-10-CM

## 2022-07-07 DIAGNOSIS — E039 Hypothyroidism, unspecified: Secondary | ICD-10-CM

## 2022-07-07 DIAGNOSIS — R7303 Prediabetes: Secondary | ICD-10-CM

## 2022-07-07 DIAGNOSIS — D489 Neoplasm of uncertain behavior, unspecified: Secondary | ICD-10-CM

## 2022-07-07 DIAGNOSIS — R519 Generalized headaches: Secondary | ICD-10-CM

## 2022-07-07 DIAGNOSIS — E782 Mixed hyperlipidemia: Secondary | ICD-10-CM

## 2022-07-07 DIAGNOSIS — J302 Other seasonal allergic rhinitis: Secondary | ICD-10-CM

## 2022-07-07 DIAGNOSIS — M1990 Unspecified osteoarthritis, unspecified site: Secondary | ICD-10-CM

## 2022-07-07 DIAGNOSIS — L821 Other seborrheic keratosis: Secondary | ICD-10-CM

## 2022-07-07 DIAGNOSIS — E785 Hyperlipidemia, unspecified: Secondary | ICD-10-CM

## 2022-07-07 DIAGNOSIS — I480 Paroxysmal atrial fibrillation: Secondary | ICD-10-CM

## 2022-07-07 DIAGNOSIS — R001 Bradycardia, unspecified: Secondary | ICD-10-CM

## 2022-07-07 DIAGNOSIS — B351 Tinea unguium: Secondary | ICD-10-CM

## 2022-07-07 DIAGNOSIS — Z9889 Other specified postprocedural states: Secondary | ICD-10-CM

## 2022-07-07 DIAGNOSIS — B353 Tinea pedis: Secondary | ICD-10-CM

## 2022-07-07 DIAGNOSIS — I2510 Atherosclerotic heart disease of native coronary artery without angina pectoris: Secondary | ICD-10-CM

## 2022-07-07 DIAGNOSIS — I4891 Unspecified atrial fibrillation: Secondary | ICD-10-CM

## 2022-07-07 DIAGNOSIS — Z823 Family history of stroke: Secondary | ICD-10-CM

## 2022-07-07 DIAGNOSIS — M25569 Pain in unspecified knee: Secondary | ICD-10-CM

## 2022-07-07 DIAGNOSIS — Z7901 Long term (current) use of anticoagulants: Secondary | ICD-10-CM

## 2022-07-07 DIAGNOSIS — L304 Erythema intertrigo: Secondary | ICD-10-CM

## 2022-07-07 DIAGNOSIS — K219 Gastro-esophageal reflux disease without esophagitis: Secondary | ICD-10-CM

## 2022-07-07 DIAGNOSIS — L570 Actinic keratosis: Secondary | ICD-10-CM

## 2022-07-07 MED ORDER — ATORVASTATIN 20 MG PO TAB
20 mg | ORAL_TABLET | Freq: Every day | ORAL | 1 refills | Status: AC
Start: 2022-07-07 — End: ?

## 2022-07-07 NOTE — Patient Instructions
Decrease your Atorvastatin to 20 mg daily    Annabell Howells, APRN  would like for you to have fasting labs drawn in October prior to your appointment with Dr. Rolm Bookbinder. If you have your labs drawn at a [place] facility, you do not need to take orders with you. If you would like to have your labs drawn at an outside facility, you will need hard copies of your lab orders or we can fax them to the lab for you.      Annabell Howells, APRN would like for you check your blood pressure twice a day for the next 14 days. Please contact our office at [phone] for consistently high numbers over 130/80. If you do not have a blood pressure cuff, please buy one or order one from a local drug store or online.      Talbert Forest would like for you to lower your salt/sodium intake.     FOLLOW UP APPOINTMENT:     Talbert Forest would like to see you back in 7 months. Please call [phone] to cancel or reschedule your visit.     In order to provide you the best care possible we ask that you follow up as below:    For questions: please call [facility] nursing line at [phone] Monday - Friday 8-5 only.         Please leave a detailed message with your name, date of birth, and reason for your call.  A nurse will return your call as soon as possible.     For all medication refills: please contact your pharmacy.      Scheduling: Please call [phone]    It was truly our pleasure seeing you today. Thank you for choosing [facility] for your heart health!    Instructions given by Chari Manning, RN

## 2022-07-07 NOTE — Progress Notes
Date of Service: 07/07/2022    Jonathan Humphrey is a 88 y.o. male.       HPI    He is a pleasant 88 year old male being seen for risk factor reduction. ?He had been followed by Dr. Vivianne Spence and [place]. 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   ?  Approximately 1 year ago he was started on Repatha in addition to his already taking atorvastatin 40 mg and Zetia 10 mg.  LDL decreased to 9.  We discontinued Zetia.  A repeat lipid panel shows LDL 11?  ?  Unfortunately his wife passed away in 12/02/23.  He has been started on Lexapro    He followed up in EP in June.  The last 2 visits his blood pressure has been elevated.  He started experiencing balance issues around that time so he decreased his walking regimen.  He has also been consuming a little more salt in the morning with his hard-boiled egg and orange.    He has continue riding stationary bike however once a day versus twice daily for 15 minutes.  He is going to balance therapy he is not sure if this is helping yet.  ?  ?He denies angina, dizziness, dyspnea or lightheadedness. ?No orthopnea or PND  ?  Pacemaker interrogation February 2 episodes of nonsustained V. tach.  He remains on sotalol.  He is asymptomatic.  Both episodes at bedtime       Vitals:    07/07/22 0950 07/07/22 0958   BP: (!) 171/92 (!) 159/83   BP Source: Arm, Left Upper Arm, Left Upper   Pulse: 58    SpO2: 97%    O2 Device: None (Room air)    PainSc: Zero    Weight: 83 kg (183 lb)    Height: 174.6 cm (5' 8.75)      Body mass index is 27.22 kg/m?Marland Kitchen     Past Medical History  Patient Active Problem List    Diagnosis Date Noted   ? Disease of spinal cord (HCC) 05/26/2022   ? S/P left atrial appendage ligation 04/04/2022     10/02/2021 - TEE:  Post left atrial appendage ligation.  No residual tissue or communication seen via color-flow Doppler in the typical location of the left atrial appendage. This suggests there is no residual left atrial appendage.  Limited views of left ventricle suggests that it is normal in size with preserved systolic function.  Estimated ejection fraction=60%.  Right ventricle is normal in size with preserved systolic function.  Left atrium is dilated.   Significant mitral annular calcification.  No significant stenosis.  Mean transmitral diastolic pressure gradient 3 mm Hg at 71 bpm.  Mild mitral valve regurgitation.  Mild to moderate tricuspid valve regurgitation.  Moderate calcific atheromatous plaque in the descending thoracic aorta.  No pericardial effusion.       ? Chronic heart failure with preserved ejection fraction (HCC) 07/07/2021   ? Balance problem 06/24/2021   ? 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 Head trauma, initial encounter 10/20/2018   ? Carpal tunnel syndrome of left wrist 07/05/2018   ? PTSD (post-traumatic stress disorder) 07/05/2018   ? Benign prostatic hyperplasia with lower urinary tract symptoms 06/03/2017   ? Shoulder arthritis 04/05/2017   ? Trigger middle finger of right hand 12/09/2016   ?  Paroxysmal ventricular tachycardia (HCC) 08/13/2016     06/07/2019 - ECHO:  Normal left ventricular size and systolic function.  LVEF 65%.  There is paradoxical septal wall motion abnormality.  Normal right ventricular size and systolic function.  Pacer lead was visualized in the right ventricle.  No hemodynamically significant valve disease.  There is moderate mitral annular calcification.  No pericardial effusion.  Mildly dilated sinus of Valsalva.  02/11/2020 - ECHO:  Normal left ventricular size. Prominent basal septum. No outflow tract obstruction. Normal systolic function. Estimated EF ~65%.  Abnormal septal wall motion consistent with right ventricular pacing.  Unable to assess diastolic function.  Normal right ventricular size and systolic function.  The left atrium is mildly dilated.  Normal right atrial size.  Device leads in right-sided chambers.  Moderate calcification of the mitral valve annulus with extension into the leaflets.  Very mild stenosis.  Mean gradient 3-4 mmHg at a heart rate of 78 bpm.  Mild regurgitation.  Aortic valve sclerosis without stenosis.  Trivial regurgitation.  Mild to moderate tricuspid valve regurgitation.  No pericardial effusion.  Estimated pulmonary artery systolic pressure 26 mmHg.   The aortic root is dilated, measuring 3.8 cm at the sinus of Valsalva.  04/30/2021 - ECHO:  A septal wall motion abnormality is noted consistent with right ventricular pacing. ?No other regional wall motion abnormalities are identified. Overall left ventricular systolic function appears normal. The estimated left ventricular ejection fraction is 60%. Left ventricular contractility appears similar when compared with the prior echocardiogram performed on 12/04/20.   Mild basal septal hypertrophy is noted. The Doppler recording obtained from the left ventricular outflow tract has normal velocity and profile and does not suggest resting left ventricular outflow tract obstruction.  Grade II (moderate) left ventricular diastolic dysfunction. Elevated left atrial pressure.  The right ventricle is not visualized well. Limited views suggest normal right ventricular size and contractility.  Mild left atrial enlargement.  Aortic valve sclerosis without stenosis.  No pericardial effusion is seen.       ? Right inguinal hernia 12/06/2015   ? Cardiac pacemaker in situ 10/18/2015     ? 10/21/15 [employer] dual-chamber PPM implantation - Dr. Naoma Diener     ? 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 BMI 31.0-31.9,adult 08/10/2015   ? Primary osteoarthritis of left knee 06/04/2015   ? Visit for preventive health examination 09/05/2012   ? AV block, 2nd degree 07/14/2010   ? Leg cramps, sleep related 04/10/2010   ? 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 Micheline Rough: 10-20% prox-CFX, 20-30% prox-RCA  06/08/2019 - CCTA:  Extensive coronary artery calcifications making the accurate estimation of stenosis difficult.  Normal coronary artery system origins and course. Right dominant system.  The left anterior descending artery with extensive coronary artery calcification in the proximal to mid segments severe stenosis cannot be excluded. The left anterior descending artery is patent in the mid to distal segments and not well-visualized apically. Study will be assessed by FFR to evaluate hemodynamically.  The left circumflex artery has mild to moderate calcific atherosclerotic changes without obvious stenosis.  The right coronary artery has mild to moderate calcific atherosclerotic changes with mild stenosis proximally. The PDA is not visualized distally.   12/04/2020 - ECHO:  Technically difficult study; i.v. transpulmonary contrast was used to define the endocardial borders.  Normal left ventricular systolic function, estimated ejection fraction is 65%.  Abnormal septal motion consistent with right ventricular pacing.  Grade III (severe) left ventricular diastolic dysfunction. Elevated left atrial pressure.  The right ventricular size, wall thickness and systolic function are normal.  Left Atrium: Mildly dilated.  Severe mitral annular calcification, the mitral valve is thickened and calcified, mild stenosis, MG =3 mmHg, trace regurgitation.  Mild tricuspid valve regurgitation.  Estimated Peak Systolic PA Pressure 24 mmHg.         ? HLD (hyperlipidemia) 04/11/2009   ? Essential hypertension 04/11/2009   ? GERD (gastroesophageal reflux disease) 04/11/2009   ? Sensorineural hearing loss 04/11/2009   ? Erectile dysfunction of non-organic origin 04/11/2009   ? Hypothyroid 04/11/2009         Review of Systems   Constitutional: Negative.   HENT: Negative.    Eyes: Negative. Cardiovascular: Negative.    Respiratory: Negative.    Endocrine: Negative.    Hematologic/Lymphatic: Negative.    Skin: Negative.    Musculoskeletal: Positive for falls.   Gastrointestinal: Negative.    Genitourinary: Negative.    Neurological: Positive for loss of balance.   Psychiatric/Behavioral: Negative.    Allergic/Immunologic: Negative.        Physical Exam   Vitals reviewed.  Constitutional: He appears well-developed.   Cardiovascular: Normal rate.   Pulmonary/Chest: Effort normal. No respiratory distress.   Musculoskeletal:         General: Normal range of motion.   Neurological: He is alert and oriented to person, place, and time.   Skin: Skin is warm and dry.         Cardiovascular Studies      Cardiovascular Health Factors  Vitals BP Readings from Last 3 Encounters:   07/07/22 (!) 159/83   06/03/22 (!) 150/84   05/26/22 (!) 141/79     Wt Readings from Last 3 Encounters:   07/07/22 83 kg (183 lb)   06/03/22 84.4 kg (186 lb)   05/26/22 85.5 kg (188 lb 9.6 oz)     BMI Readings from Last 3 Encounters:   07/07/22 27.22 kg/m?   06/03/22 28.28 kg/m?   05/26/22 27.84 kg/m?      Smoking Social History     Tobacco Use   Smoking Status Never   Smokeless Tobacco Never   Vaping Use   ? Vaping Use: Never used      Lipid Profile Cholesterol   Date Value Ref Range Status   03/03/2022 109  Final     HDL   Date Value Ref Range Status   03/03/2022 86  Final     LDL   Date Value Ref Range Status   03/03/2022 11  Final     Triglycerides   Date Value Ref Range Status   03/03/2022 61  Final      Blood Sugar Hemoglobin A1C   Date Value Ref Range Status   10/30/2021 5.8 4.0 - 6.0 % Final     Comment:     The ADA recommends that most patients with type 1 and type 2 diabetes maintain   an A1c level <7%.       Glucose   Date Value Ref Range Status   11/24/2021 107  Final   09/15/2021 96 70 - 100 MG/DL Final   16/09/9603 540 (H) 70 - 100 MG/DL Final   98/10/9146 94 70 - 110 MG/DL Final   82/95/6213 086 (H) 70 - 110 MG/DL Final 57/84/6962 96 70 - 110 MG/DL Final     Glucose Fasting   Date Value Ref Range Status   10/30/2021 100  70 - 100 MG/DL Final     Glucose, POC   Date Value Ref Range Status   05/18/2010 89 70 - 100 MG/DL Final   91/47/8295 621 (H) 70 - 100 MG/DL Final   30/86/5784 696 (H) 70 - 100 MG/DL Final          Problems Addressed Today  Encounter Diagnoses   Name Primary?   ? Prediabetes Yes   ? Coronary artery disease, non-occlusive    ? Mixed hyperlipidemia    ? Atrial fibrillation, unspecified type (HCC)    ? Hypothyroidism, unspecified type        Assessment and Plan     Coronary artery disease previous intervention. ?Without anginal symptoms. ?LDL goal less than 70. ?We will continue with risk factor reduction.  He is establishing care with Dr.Pierpoline in October.  Stress testing approximately 3 years ago was low risk.  Currently without anginal symptoms.   ?  Dyslipidemia. ?He is currently taking Repatha and atorvastatin 40 mg.  ?Recent LDL 11.  HDL has improved.  Triglycerides are optimal.  We will decrease atorvastatin to 20 mg.  Repeat lipid panel in 4 months  ?  History of atrial fibrillation. ?Left atrial appendage ligation. ?Warfarin was discontinued after TEE. ?His rate is controlled. ?He remains on sotalol. ?He will follow-up with Dr. [name] as planned  ?  Nonsustained V. tach.  Electrolytes within normal limits.  Patient asymptomatic.  He remains on sotalol.  He will continue to follow with Dr. [name] as planned.  ?  Blood pressure is elevated today.  It was also elevated at his June visit.  I have asked him to eliminate added salt from his diet.  Increase his biking to 20 minutes a day and monitor blood pressure daily for 2 weeks.  I have asked him to call with the blood pressure report.?no changes are made today. ?He will continue with lisinopril 20 mg daily.  ?  Exercise regimen has declined since he started experiencing more vertigo.  He will increase his bike to 20 minutes daily.    BMI of 27  ?  Hemoglobin A1c of 5.8.  He is going to decrease his boost intake and increase protein, vegetables berries and nuts         Current Medications (including today's revisions)  ? aspirin EC 81 mg tablet Take 1 tablet by mouth daily. Take with food.   ? atorvastatin (LIPITOR) 20 mg tablet Take one tablet by mouth daily.   ? CHOLEcalciferoL (vitamin D3) 1,000 units tablet Take one tablet by mouth daily.   ? cyanocobalamin (VITAMIN B-12) 1,000 mcg tablet Take one tablet by mouth daily. Start this the day after taking your one time Vitamin B12 injection.   ? escitalopram oxalate (LEXAPRO) 10 mg tablet Take one tablet by mouth daily.   ? evolocumab (REPATHA SURECLICK) 140 mg/mL injectable PEN Inject 1 mL under the skin every 14 days.   ? levothyroxine (SYNTHROID) 50 mcg tablet Take one tablet by mouth daily.   ? lisinopriL (ZESTRIL) 20 mg tablet Take one tablet by mouth daily.   ? OMEGA-3 FATTY ACIDS/FISH OIL (FISH OIL-OMEGA-3 FATTY ACIDS PO) Take 4 tablets by mouth daily.   ? sotaloL (BETAPACE) 120 mg tablet TAKE 1 TABLET BY MOUTH TWICE DAILY

## 2022-07-08 ENCOUNTER — Encounter: Admit: 2022-07-08 | Discharge: 2022-07-08 | Payer: MEDICARE

## 2022-07-27 ENCOUNTER — Encounter: Admit: 2022-07-27 | Discharge: 2022-07-27 | Payer: MEDICARE

## 2022-07-27 DIAGNOSIS — E039 Hypothyroidism, unspecified: Secondary | ICD-10-CM

## 2022-07-27 DIAGNOSIS — I2510 Atherosclerotic heart disease of native coronary artery without angina pectoris: Secondary | ICD-10-CM

## 2022-07-27 DIAGNOSIS — E782 Mixed hyperlipidemia: Secondary | ICD-10-CM

## 2022-07-27 DIAGNOSIS — I4891 Unspecified atrial fibrillation: Secondary | ICD-10-CM

## 2022-07-27 MED ORDER — LEVOTHYROXINE 50 MCG PO TAB
ORAL_TABLET | ORAL | 2 refills | 30.00000 days | Status: AC
Start: 2022-07-27 — End: ?

## 2022-08-04 ENCOUNTER — Encounter: Admit: 2022-08-04 | Discharge: 2022-08-04 | Payer: MEDICARE

## 2022-08-14 ENCOUNTER — Encounter: Admit: 2022-08-14 | Discharge: 2022-08-13 | Payer: MEDICARE

## 2022-08-14 ENCOUNTER — Encounter: Admit: 2022-08-14 | Discharge: 2022-08-14 | Payer: MEDICARE

## 2022-08-14 NOTE — Telephone Encounter
Pt LVM inquiring if there were any labs Al-Hihi would like patient to get prior to appt on 9/5.Marland Kitchen Requesting any labs be sent to [facility].

## 2022-08-17 ENCOUNTER — Encounter: Admit: 2022-08-17 | Discharge: 2022-08-17 | Payer: MEDICARE

## 2022-08-17 NOTE — Progress Notes
Pt hospitalized since last office visit: No    Health Maintenance Due   Topic Date Due    COVID-19 VACCINE (6 - Moderna series) 04/22/2022       There are no diagnoses linked to this encounter.    Labs not done:      Lab Frequency Next Occurrence   REQUEST FOR CARDIOLOGY APPOINTMENT Once 07/07/2019   REQUEST FOR CARDIOLOGY APPOINTMENT Once 03/11/2020   CBC AND DIFF Once 82/95/6213   BASIC METABOLIC PANEL Once 08/65/7846   HEMOGLOBIN A1C Once 10/22/2021   LIPID PROFILE Once 10/22/2021   LIVER FUNCTION PANEL Once 10/22/2021   TSH WITH FREE T4 REFLEX Once 10/22/2021   REQUEST FOR CARDIOLOGY APPOINTMENT Once 07/30/2022   LIPID PROFILE Once 12/16/2021   LIPID PROFILE Once 02/04/2022   CBC AND DIFF Once 96/29/5284   BASIC METABOLIC PANEL Once 13/24/4010   HEMOGLOBIN A1C Once 04/22/2022   LIPID PROFILE Once 04/22/2022   LIVER FUNCTION PANEL Once 04/22/2022   TSH WITH FREE T4 REFLEX Once 04/22/2022   LIPID PROFILE Once 07/04/2022   HEMOGLOBIN A1C Once 07/04/2022   ALT (SGPT) Once 07/04/2022   AST (SGOT) Once 07/04/2022   GLUCOSE, FASTING Once 07/04/2022   REQUEST FOR CARDIOLOGY APPOINTMENT Once 12/03/2022   DEVICE EVALUATION - PPM (PERMANENT PACEMAKER) Once 12/03/2022   LIPID PROFILE Once 10/07/2022   HEMOGLOBIN A1C Once 10/07/2022   ALT (SGPT) Once 10/07/2022   AST (SGOT) Once 10/07/2022   GLUCOSE, FASTING Once 10/07/2022   REQUEST FOR CARDIOLOGY APPOINTMENT Once 02/07/2023   DEVICE EVALUATION - REMOTE PPM         MyChart message sent to patient on 08/17/22 Raeanne Gathers, RN     Notes to provider:

## 2022-08-19 ENCOUNTER — Encounter: Admit: 2022-08-19 | Discharge: 2022-08-19 | Payer: MEDICARE

## 2022-08-19 NOTE — Telephone Encounter
-----   Message from Alwyn Pea, RN sent at 08/19/2022  8:50 AM CDT -----  Regarding: FW: One NSVT episode on remote ppm check.  Hi,    Pt not yet established with gen card provider ([facility] retired and [place] does preventative).   Can Dr. Cleatrice Burke please address.    Thanks,  Alwyn Pea RN    ----- Message -----  From: Mercy Riding  Sent: 08/19/2022   8:31 AM CDT  To: [facility]  Subject: One NSVT episode on remote ppm check.            Not sure if you need to know this. Patient has a history of NSVT.     One V episode 08/15/22, lasted 5 seconds, 173 bpm. Egm showed NSVT.

## 2022-08-19 NOTE — Telephone Encounter
Attempted to call pt, no answer unable to LVM.

## 2022-08-19 NOTE — Telephone Encounter
Attempted to call pt's son since unable to reach pt. LVM requesting call back.

## 2022-08-25 ENCOUNTER — Encounter: Admit: 2022-08-25 | Discharge: 2022-08-25 | Payer: MEDICARE

## 2022-08-25 ENCOUNTER — Ambulatory Visit: Admit: 2022-08-25 | Discharge: 2022-08-26 | Payer: MEDICARE

## 2022-08-25 VITALS — BP 164/90 | HR 63 | Temp 98.10000°F | Resp 14 | Ht 68.74 in | Wt 186.2 lb

## 2022-08-25 VITALS — BP 162/89 | HR 64

## 2022-08-25 DIAGNOSIS — H9190 Unspecified hearing loss, unspecified ear: Secondary | ICD-10-CM

## 2022-08-25 DIAGNOSIS — E039 Hypothyroidism, unspecified: Secondary | ICD-10-CM

## 2022-08-25 DIAGNOSIS — I5032 Chronic diastolic (congestive) heart failure: Secondary | ICD-10-CM

## 2022-08-25 DIAGNOSIS — I4891 Unspecified atrial fibrillation: Secondary | ICD-10-CM

## 2022-08-25 DIAGNOSIS — R519 Generalized headaches: Secondary | ICD-10-CM

## 2022-08-25 DIAGNOSIS — B079 Viral wart, unspecified: Secondary | ICD-10-CM

## 2022-08-25 DIAGNOSIS — R5383 Other fatigue: Secondary | ICD-10-CM

## 2022-08-25 DIAGNOSIS — E785 Hyperlipidemia, unspecified: Secondary | ICD-10-CM

## 2022-08-25 DIAGNOSIS — K219 Gastro-esophageal reflux disease without esophagitis: Secondary | ICD-10-CM

## 2022-08-25 DIAGNOSIS — L578 Other skin changes due to chronic exposure to nonionizing radiation: Secondary | ICD-10-CM

## 2022-08-25 DIAGNOSIS — F5221 Male erectile disorder: Secondary | ICD-10-CM

## 2022-08-25 DIAGNOSIS — M1990 Unspecified osteoarthritis, unspecified site: Secondary | ICD-10-CM

## 2022-08-25 DIAGNOSIS — Z7901 Long term (current) use of anticoagulants: Secondary | ICD-10-CM

## 2022-08-25 DIAGNOSIS — E7800 Pure hypercholesterolemia, unspecified: Secondary | ICD-10-CM

## 2022-08-25 DIAGNOSIS — I495 Sick sinus syndrome: Secondary | ICD-10-CM

## 2022-08-25 DIAGNOSIS — B353 Tinea pedis: Secondary | ICD-10-CM

## 2022-08-25 DIAGNOSIS — I10 Essential (primary) hypertension: Secondary | ICD-10-CM

## 2022-08-25 DIAGNOSIS — J302 Other seasonal allergic rhinitis: Secondary | ICD-10-CM

## 2022-08-25 DIAGNOSIS — R001 Bradycardia, unspecified: Secondary | ICD-10-CM

## 2022-08-25 DIAGNOSIS — L570 Actinic keratosis: Secondary | ICD-10-CM

## 2022-08-25 DIAGNOSIS — L304 Erythema intertrigo: Secondary | ICD-10-CM

## 2022-08-25 DIAGNOSIS — D489 Neoplasm of uncertain behavior, unspecified: Secondary | ICD-10-CM

## 2022-08-25 DIAGNOSIS — M25569 Pain in unspecified knee: Secondary | ICD-10-CM

## 2022-08-25 DIAGNOSIS — B351 Tinea unguium: Secondary | ICD-10-CM

## 2022-08-25 DIAGNOSIS — L821 Other seborrheic keratosis: Secondary | ICD-10-CM

## 2022-08-25 DIAGNOSIS — Z823 Family history of stroke: Secondary | ICD-10-CM

## 2022-08-25 DIAGNOSIS — R42 Dizziness and giddiness: Secondary | ICD-10-CM

## 2022-08-25 DIAGNOSIS — I480 Paroxysmal atrial fibrillation: Secondary | ICD-10-CM

## 2022-08-25 DIAGNOSIS — N401 Enlarged prostate with lower urinary tract symptoms: Secondary | ICD-10-CM

## 2022-08-25 DIAGNOSIS — Z9889 Other specified postprocedural states: Secondary | ICD-10-CM

## 2022-08-25 DIAGNOSIS — R7301 Impaired fasting glucose: Secondary | ICD-10-CM

## 2022-08-25 MED ORDER — LISINOPRIL 30 MG PO TAB
30 mg | ORAL_TABLET | Freq: Every day | ORAL | 3 refills | Status: AC
Start: 2022-08-25 — End: ?

## 2022-08-25 NOTE — Patient Instructions
Get fasting labs few days before return to clinic in 2 months

## 2022-08-25 NOTE — Progress Notes
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 Placed This Encounter   ? CBC AND DIFF   ? BASIC METABOLIC PANEL   ? HEMOGLOBIN A1C   ? LIPID PROFILE   ? LIVER FUNCTION PANEL   ? MICROALB/CR RATIO-URINE RANDOM   ? TSH WITH FREE T4 REFLEX   ? lisinopriL (ZESTRIL) 30 mg tablet

## 2022-08-26 ENCOUNTER — Encounter: Admit: 2022-08-26 | Discharge: 2022-08-26 | Payer: MEDICARE

## 2022-08-26 DIAGNOSIS — M1712 Unilateral primary osteoarthritis, left knee: Secondary | ICD-10-CM

## 2022-08-26 DIAGNOSIS — I2510 Atherosclerotic heart disease of native coronary artery without angina pectoris: Principal | ICD-10-CM

## 2022-08-26 DIAGNOSIS — E039 Hypothyroidism, unspecified: Secondary | ICD-10-CM

## 2022-08-26 DIAGNOSIS — R7301 Impaired fasting glucose: Secondary | ICD-10-CM

## 2022-08-26 DIAGNOSIS — K219 Gastro-esophageal reflux disease without esophagitis: Secondary | ICD-10-CM

## 2022-08-26 DIAGNOSIS — Z950 Presence of cardiac pacemaker: Secondary | ICD-10-CM

## 2022-08-26 DIAGNOSIS — Z0000 Encounter for general adult medical examination without abnormal findings: Secondary | ICD-10-CM

## 2022-08-26 DIAGNOSIS — I10 Essential (primary) hypertension: Secondary | ICD-10-CM

## 2022-08-26 DIAGNOSIS — E782 Mixed hyperlipidemia: Secondary | ICD-10-CM

## 2022-08-26 DIAGNOSIS — I4891 Unspecified atrial fibrillation: Secondary | ICD-10-CM

## 2022-08-26 DIAGNOSIS — E7800 Pure hypercholesterolemia, unspecified: Secondary | ICD-10-CM

## 2022-08-26 NOTE — Telephone Encounter
LM for pt regarding question he had about his transmissions.  Let him know to call back with details and would try to answer his questions.

## 2022-08-31 ENCOUNTER — Encounter: Admit: 2022-08-31 | Discharge: 2022-08-31 | Payer: MEDICARE

## 2022-09-15 ENCOUNTER — Encounter: Admit: 2022-09-15 | Discharge: 2022-09-15 | Payer: MEDICARE

## 2022-09-15 NOTE — Telephone Encounter
Pt LVM requesting call back because he has his medication list that says atorvastatin '20mg'$  & atorvastatin '40mg'$ .  Pt would like to know which dosage he is supposed to be taking.    Upon review of chart, pt had appt w/Shirley Verbenec on 07/07/22 which showed:    Dyslipidemia. He is currently taking Repathaand atorvastatin 40 mg. Recent LDL 11. HDL has improved. Triglycerides are optimal. We will decrease atorvastatin to 20 mg. Repeat lipid panel in 4 months.    Pt stated that he is no longer taking Repatha because it put him in donut hole & he felt his bloodwork did not reflect that it was making difference.  Informed pt that his last lipid panel was done in 02/2022 so in July it may not have been indicator of whether or not medication was making difference.  Instructed pt that he should discuss w/their office if he is no longer taking because physicians are only able to determine dosing of all medications based on understanding of what pt is taking.  Informed pt that it is his choice to take or not take medications but it is important to tell providers.  Pt expressed understanding.  Pt stated that he would like to get his labs drawn to see how his lipid profile was.  Informed that there were labs that she had ordered for him to have drawn by 10/07/22.  Pt stated that he would like to get them done at [facility].  Inquired about fax number & that I would fax lab orders to them.  Pt stated that he would contact this nurse back w/fax number.    Pt LVM stating that fax number for [facility] is [phone].    Faxed lab orders to [facility] w/successful fax transmission.

## 2022-09-29 ENCOUNTER — Encounter: Admit: 2022-09-29 | Discharge: 2022-09-29 | Payer: MEDICARE

## 2022-10-12 ENCOUNTER — Encounter: Admit: 2022-10-12 | Discharge: 2022-10-12 | Payer: MEDICARE

## 2022-10-12 MED ORDER — SOTALOL 120 MG PO TAB
120 mg | ORAL_TABLET | Freq: Two times a day (BID) | ORAL | 3 refills | 30.00000 days | Status: AC
Start: 2022-10-12 — End: ?

## 2022-10-15 ENCOUNTER — Encounter: Admit: 2022-10-15 | Discharge: 2022-10-15 | Payer: MEDICARE

## 2022-10-15 DIAGNOSIS — Z950 Presence of cardiac pacemaker: Secondary | ICD-10-CM

## 2022-10-15 NOTE — Telephone Encounter
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  He has done well since that time.  He last saw her in August of last year and did have good control of VTE."      VT/NSVT is not new for him but seems to have been controlled by Sotalol.    I will assess for s/s and compliance with Sotalol when he calls back

## 2022-10-15 NOTE — Telephone Encounter
-----   Message from Bishop sent at 10/15/2022  8:41 AM CDT -----  Regarding: RCP pt with VT detection and has a PPM  Good Morning!!!!    This morning Mr. Nylund alerted for VT detection.  EGM's suggestive of VT/NSVT.  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    A Yellow Alert was reported by the device:  VT Episode occurred (V>A).  Date of Episode: 10/14/2022; 18:28  EGMs suggestive of 1 VT run and 1 NSVT run  Duration of VT Run: 01 min. 17 seconds. Talk to [employer]: it was reported the double line means "time has passed" between EGM's. Rep reported 01:09 sec had elapsed.  Duration of NSVT run: 11 beats (~3 seconds)  Avg V rate: 183bpm  V detection rate: 160 bpm (381m)  EGM show clear onset and termination of each event.  NSVT/VT not on problems list in Epic    For further details you can review the PPM report on 10/15/2022      [facility]you [facility] Th[facility].    JMerry Proud

## 2022-10-15 NOTE — Telephone Encounter
RCP- RC at 11:35am. Call him at [phone].        Spoke to the patient. He reports missing yesterday and the day before of Sotalol due to him running out.  He does have it now and took it this AM. He denied any s/s with the event and is overall feeling ok.

## 2022-10-16 ENCOUNTER — Encounter: Admit: 2022-10-16 | Discharge: 2022-10-16 | Payer: MEDICARE

## 2022-10-16 ENCOUNTER — Ambulatory Visit: Admit: 2022-10-16 | Discharge: 2022-10-17 | Payer: MEDICARE

## 2022-10-16 DIAGNOSIS — R519 Generalized headaches: Secondary | ICD-10-CM

## 2022-10-16 DIAGNOSIS — Z950 Presence of cardiac pacemaker: Secondary | ICD-10-CM

## 2022-10-16 DIAGNOSIS — L821 Other seborrheic keratosis: Secondary | ICD-10-CM

## 2022-10-16 DIAGNOSIS — Z7901 Long term (current) use of anticoagulants: Secondary | ICD-10-CM

## 2022-10-16 DIAGNOSIS — I480 Paroxysmal atrial fibrillation: Secondary | ICD-10-CM

## 2022-10-16 DIAGNOSIS — H9190 Unspecified hearing loss, unspecified ear: Secondary | ICD-10-CM

## 2022-10-16 DIAGNOSIS — E785 Hyperlipidemia, unspecified: Secondary | ICD-10-CM

## 2022-10-16 DIAGNOSIS — I4891 Unspecified atrial fibrillation: Secondary | ICD-10-CM

## 2022-10-16 DIAGNOSIS — K219 Gastro-esophageal reflux disease without esophagitis: Secondary | ICD-10-CM

## 2022-10-16 DIAGNOSIS — R42 Dizziness and giddiness: Secondary | ICD-10-CM

## 2022-10-16 DIAGNOSIS — J302 Other seasonal allergic rhinitis: Secondary | ICD-10-CM

## 2022-10-16 DIAGNOSIS — F5221 Male erectile disorder: Secondary | ICD-10-CM

## 2022-10-16 DIAGNOSIS — B351 Tinea unguium: Secondary | ICD-10-CM

## 2022-10-16 DIAGNOSIS — R001 Bradycardia, unspecified: Secondary | ICD-10-CM

## 2022-10-16 DIAGNOSIS — I4729 Paroxysmal ventricular tachycardia (HCC): Secondary | ICD-10-CM

## 2022-10-16 DIAGNOSIS — Z823 Family history of stroke: Secondary | ICD-10-CM

## 2022-10-16 DIAGNOSIS — L570 Actinic keratosis: Secondary | ICD-10-CM

## 2022-10-16 DIAGNOSIS — L304 Erythema intertrigo: Secondary | ICD-10-CM

## 2022-10-16 DIAGNOSIS — E039 Hypothyroidism, unspecified: Secondary | ICD-10-CM

## 2022-10-16 DIAGNOSIS — I495 Sick sinus syndrome: Secondary | ICD-10-CM

## 2022-10-16 DIAGNOSIS — I441 Atrioventricular block, second degree: Secondary | ICD-10-CM

## 2022-10-16 DIAGNOSIS — L578 Other skin changes due to chronic exposure to nonionizing radiation: Secondary | ICD-10-CM

## 2022-10-16 DIAGNOSIS — Z9889 Other specified postprocedural states: Secondary | ICD-10-CM

## 2022-10-16 DIAGNOSIS — I2510 Atherosclerotic heart disease of native coronary artery without angina pectoris: Secondary | ICD-10-CM

## 2022-10-16 DIAGNOSIS — M25569 Pain in unspecified knee: Secondary | ICD-10-CM

## 2022-10-16 DIAGNOSIS — B079 Viral wart, unspecified: Secondary | ICD-10-CM

## 2022-10-16 DIAGNOSIS — I10 Essential (primary) hypertension: Secondary | ICD-10-CM

## 2022-10-16 DIAGNOSIS — M1990 Unspecified osteoarthritis, unspecified site: Secondary | ICD-10-CM

## 2022-10-16 DIAGNOSIS — D489 Neoplasm of uncertain behavior, unspecified: Secondary | ICD-10-CM

## 2022-10-16 DIAGNOSIS — R5383 Other fatigue: Secondary | ICD-10-CM

## 2022-10-16 DIAGNOSIS — I5032 Chronic diastolic (congestive) heart failure: Secondary | ICD-10-CM

## 2022-10-16 DIAGNOSIS — B353 Tinea pedis: Secondary | ICD-10-CM

## 2022-10-16 MED ORDER — ATORVASTATIN 20 MG PO TAB
40 mg | ORAL_TABLET | Freq: Every evening | ORAL | 3 refills | Status: AC
Start: 2022-10-16 — End: ?

## 2022-10-16 MED ORDER — EZETIMIBE 10 MG PO TAB
10 mg | ORAL_TABLET | Freq: Every day | ORAL | 1 refills | Status: AC
Start: 2022-10-16 — End: ?

## 2022-10-16 NOTE — Progress Notes
Date of Service: 10/16/2022    Jonathan Humphrey is a 88 y.o. male.          History of present illness: Jonathan Humphrey is a very pleasant 88 y.o. male who is being seen at the [facility] Cardiovascular Medicine Department at the [facility] office.  He also follows with Dr. [name] in the EP clinic.    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    He presents today in routine follow-up.  He has no acute cardiovascular concerns or complaints today.  Since his last visit he notes he does tend to forget medications periodically.  He continues to have some issues as his wife died earlier this year, and she previously helped him with a lot of his medical affairs and he is understandably still grieving as well.  He was noted to have approximately 1 minute of VT per his device, although he was asymptomatic from this.  He notes that he had missed probably 2-3 doses of sotalol during that timeframe.  He has not noted any chest pain, chest pressure, PND, orthopnea, lower extremity IMA or other new symptoms.  He has recently seen his primary care physician and his lisinopril dose was adjusted, although he does not check his blood pressure much at home.    Assessment:  ? Ventricular tachycardia and frequent PVCs, currently on sotalol  ? High degree AV block/sinus node dysfunction   ? Dual-chamber Medtronic pacemaker in situ  ? Hypertension  ? Dyslipidemia  ? Paroxysmal atrial fibrillation, currently sinus rhythm  ? Surgical left atrial appendage ligation, currently on no anticoagulation  ? 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 History of coronary disease with reported angioplasty 9095    Plan:  We discussed the utmost importance of aggressive risk factor modification.  We reinforced the need for accurate and timely medication administration.  We have asked him to keep a weekly pillbox and utilize a twice daily alarm to help him be reminded of the medication timing.    He was quite surprised to hear about his ventricular rhythms as he was asymptomatic.  He underwent a laboratory evaluation earlier this week, and we have requested these records [facility] to assess his lipid profile (off of Repatha) and electrolytes.  He does note that he has missed several doses of sotalol.  We have asked him to keep taking sotalol twice daily.  We will continue to monitor his ectopic burden via his device while on sotalol.  He is also set to follow with Dr. [name] in the EP clinic as scheduled.    His blood pressure looks optimal today and we will continue lisinopril 30 mg daily.    He previously achieved optimal lipid profile on Repatha/atorvastatin.  Unfortunately Repatha is now quite expensive on his current donut hole, therefore he has not resumed this.  For the time being we will have him resume atorvastatin 40 mg daily and Zetia 10 mg daily (previously this resulted in sub optimal LDL).  We can revisit his lipid profile next year, and would prefer to resume his previous dosing of Repatha/atorvastatin if it is affordable.    He has maintained sinus rhythm on sotalol as above.  He does not require long-term anticoagulation  as his left atrial appendage has been surgically ligated.    He will continue aspirin 81 mg daily.    We discussed ongoing lifestyle modifications     Thank you for allowing [place] to care for this patient. If there are any questions or concerns, please don't hesitate to contact us.     We will see the patient back in 4 months time, or sooner if needed.     Alda Lea, DO, [facility]  Staff Cardiologist  Department of Cardiovascular Medicine  [facility]      Latest cardiovascular testing includes:    Most recent results for 12-Lead ECG   ECG 12-LEAD    Collection Time: 06/03/22  9:53 AM   Result Value Status    VENTRICULAR RATE 61 Final    P-R INTERVAL 226 Final    QRS DURATION 162 Final    Q-T INTERVAL 532 Final    QTC CALCULATION (BAZETT) 535 Final    P AXIS 80 Final    R AXIS 74 Final    T AXIS 36 Final    Impression    Atrial-sensed ventricular-paced rhythm with prolonged AV conduction  Abnormal ECG  Confirmed by Lucianne Muss ([id number]) on 06/03/2022 2:17:07 PM     *Note: Due to a large number of results and/or encounters for the requested time period, some results have not been displayed. A complete set of results can be found in Results Review.     Device interrogation:  * VT Episode occurred (V>A).  * Date of Episode: 10/14/2022; 18:28  * EGMs suggestive of 1 VT run and 1 NSVT run  * Duration of VT Run: 01 min. 17 seconds. Talk to [employer]: it was reported the double line means time has passed between EGM's. Rep reported 01:09 sec had elapsed.  * Duration of NSVT run: 11 beats (~3 seconds)  * Avg V rate: 183bpm  * V detection rate: 160 bpm ( )  * EGM show clear onset and termination of each event.    Last Stress test: 01/10/2019:  TID Ratio:?? 0.8??? (Normal <1.36).  ?  Summed Stress Score:  0    , Summed Rest Score:  0  ?  The left ventricular cavity size is within normal limits. Transient ischemic dilatation of the left ventricle cavity is not observed. Pulmonary tracer uptake is normal.   ?  Tomographic:    Post stress projection and tomographic reconstructions in the supine and upright positions demonstrate equivocal reduction in radioisotope uptake in the inferior apex, but these findings are only observed on the stress upright images. The stress supine images show uniform tracer uptake & no perfusion abnormalities. The stress upright findings are likely due to attenuation artifact. Overall, there are no definite fixed or reversible perfusion abnormalities.  All myocardial segments appear viable. Left ventricular cavity size is within normal limits. Transient ischemic dilatation of the left ventricle cavity is not observed. Pulmonary tracer uptake appears normal.  ?  Quantitative polar coordinate maps identify no perfusion defects.   ?  ?  Regional Wall Thickening and Motion Post Stress:    Review of the post-stress gated images demonstrate normal wall motion and thickening of all myocardial segments. Regional and global left ventricular systolic function are normal.  ?  Left Ventricular Ejection Fraction (post stress, in the resting state) = 89 %.  ?  Left Ventricular End Diastolic Volume: 46 mL.  ?  SUMMARY/OPINION:    This study is normal and represents  low probability for significant inducible myocardial ischemia. There are no definite perfusion abnormalities. All myocardial segments appear viable. Regional and global left ventricular function are normal. High risk scintigraphic features are absent.   ?  This study was compared to the previous one performed on 08/14/2016. In qualitative comparison, the two perfusion patterns appear unchanged as the previous study also had no definite perfusion abnormalities and overall was a low risk study. The previous LVEF was 73 % with an LVEDV of 54 mL. There are no other significant interval changes.  ?  In aggregate the current study is low risk in regards to predicted annual cardiovascular mortality rate.    Last heart catheterization: 07/05/2019:  LEFT HEART CATHETERIZATION:    1. Aortic pressure:  99/62 with a mean of 77 mmHg.  2. LV systolic pressure of 101 mmHg.  3. LVEDP of 10-12 mmHg.  ?  SELECTIVE CORONARY ANGIOGRAPHY:    1. Left main coronary artery:  The left main coronary artery arises normally from the left coronary sinus.  It distally bifurcates into the left anterior descending artery and left circumflex artery.  It is angiographically free of significant disease.  2. Left anterior descending artery:  Left anterior descending artery appears to be a large-caliber vessel which gives rise to 2 diagonal branches.  The LAD is moderately calcified in the midportion and has 30% disease at the takeoff of the first diagonal.  The diagonal branches appear to be free of significant disease.  3. Left circumflex artery:  The left main artery has 30% disease at its takeoff.  It gives rise to 2 obtuse marginal branches.  The remaining of the circumflex, including the OM branches, appear to be free of significant disease.  4. Right coronary artery:  The right coronary artery arises normally from the right coronary sinus.  It distally bifurcates into PDA and PL branches.  The RCA has a 20% disease in the midportion, otherwise free of significant disease.  ??  IMPRESSION:    1. Mild coronary artery disease as described above, unchanged from prior study.  2. Normal LVEDP.  3. No significant gradient across the aortic valve on pullback.  ?  RECOMMENDATIONS:  Continue medical therapy and aggressive lifestyle risk factor modification.  Last echocardiogram: TEE on 10/02/2021:  1. Post left atrial appendage ligation.  No residual tissue or communication seen via color-flow Doppler in the typical location of the left atrial appendage. This suggests there is no residual left atrial appendage.  2. Limited views of left ventricle suggests that it is normal in size with preserved systolic function.  Estimated ejection fraction=60%.   3. Right ventricle is normal in size with preserved systolic function  4. left atrium is dilated.    5. Significant mitral annular calcification.  No significant stenosis.  Mean transmitral diastolic pressure gradient 3 mm Hg at 71 bpm.  Mild mitral valve regurgitation.   6. Mild to moderate tricuspid valve regurgitation.  7. Moderate calcific atheromatous plaque in the descending thoracic aorta.  8. No pericardial effusion.  ?  Has a prior transesophageal echocardiogram done in 2011.  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  There has been interval resection of the left atrial appendage.      Last Lipid profile and pertinent labs:   Lab Level  Lab Results   Component Value Date    CHOL 109 03/03/2022     Lab Results   Component Value Date  LDL 11 03/03/2022     No results found for: LIPOPROTA  Lab Results   Component Value Date    HDL 86 03/03/2022     Lab Results   Component Value Date    NONHDLCHOL 23 03/03/2022     Lab Results   Component Value Date    TRIG 61 03/03/2022       Orders Placed This Encounter   ? atorvastatin (LIPITOR) 20 mg tablet   ? ezetimibe (ZETIA) 10 mg tablet       Total time spent on today's office visit was >41 minutes including >50% of time time involved in counseling.  This includes face-to-face in person visit with patient as well as nonface-to-face time including review of the EMR, outside records, labs, radiologic studies, echocardiogram & other cardiovascular studies, formation of treatment plan, after visit summary, future disposition, and lastly on documentation.    Staff name:  Renato Shin, DO Date:  10/16/2022               Vitals:    10/16/22 1125   BP: 110/80   BP Source: Arm, Right Upper   SpO2: 98%   O2 Device: None (Room air)   PainSc: Zero   Weight: 83.5 kg (184 lb)   Height: 172.7 cm (5' 8)     Body mass index is 27.98 kg/m?Marland Kitchen     Past Medical History  Patient Active Problem List    Diagnosis Date Noted   ? Disease of spinal cord (HCC) 05/26/2022   ? S/P left atrial appendage ligation 04/04/2022     10/02/2021 - TEE:  Post left atrial appendage ligation.  No residual tissue or communication seen via color-flow Doppler in the typical location of the left atrial appendage. This suggests there is no residual left atrial appendage.  Limited views of left ventricle suggests that it is normal in size with preserved systolic function.  Estimated ejection fraction=60%.  Right ventricle is normal in size with preserved systolic function.  Left atrium is dilated.   Significant mitral annular calcification.  No significant stenosis.  Mean transmitral diastolic pressure gradient 3 mm Hg at 71 bpm.  Mild mitral valve regurgitation.  Mild to moderate tricuspid valve regurgitation.  Moderate calcific atheromatous plaque in the descending thoracic aorta.  No pericardial effusion.       ? Chronic heart failure with preserved ejection fraction (HCC) 07/07/2021   ? Balance problem 06/24/2021   ? 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 Head trauma, initial encounter 10/20/2018   ? Carpal tunnel syndrome of left wrist 07/05/2018   ? PTSD (post-traumatic stress disorder) 07/05/2018   ? Benign prostatic hyperplasia with lower urinary tract symptoms 06/03/2017   ? Shoulder arthritis 04/05/2017   ? Trigger middle finger of right hand 12/09/2016   ? Paroxysmal ventricular tachycardia (HCC) 08/13/2016     06/07/2019 - ECHO:  Normal left ventricular size and systolic function.  LVEF 65%.  There is paradoxical septal wall motion abnormality.  Normal right ventricular size and systolic function.  Pacer lead was visualized in the right ventricle.  No hemodynamically significant valve disease.  There is moderate mitral annular calcification.  No pericardial effusion.  Mildly dilated sinus of Valsalva.  02/11/2020 - ECHO:  Normal left ventricular size. Prominent basal septum. No outflow tract obstruction. Normal systolic function. Estimated EF ~65%.  Abnormal septal wall motion consistent with right ventricular pacing.  Unable to assess diastolic function.  Normal right  ventricular size and systolic function.  The left atrium is mildly dilated.  Normal right atrial size.  Device leads in right-sided chambers.  Moderate calcification of the mitral valve annulus with extension into the leaflets.  Very mild stenosis.  Mean gradient 3-4 mmHg at a heart rate of 78 bpm.  Mild regurgitation.  Aortic valve sclerosis without stenosis.  Trivial regurgitation.  Mild to moderate tricuspid valve regurgitation.  No pericardial effusion.  Estimated pulmonary artery systolic pressure 26 mmHg.   The aortic root is dilated, measuring 3.8 cm at the sinus of Valsalva.  04/30/2021 - ECHO:  A septal wall motion abnormality is noted consistent with right ventricular pacing. ?No other regional wall motion abnormalities are identified. Overall left ventricular systolic function appears normal. The estimated left ventricular ejection fraction is 60%. Left ventricular contractility appears similar when compared with the prior echocardiogram performed on 12/04/20.   Mild basal septal hypertrophy is noted. The Doppler recording obtained from the left ventricular outflow tract has normal velocity and profile and does not suggest resting left ventricular outflow tract obstruction.  Grade II (moderate) left ventricular diastolic dysfunction. Elevated left atrial pressure.  The right ventricle is not visualized well. Limited views suggest normal right ventricular size and contractility.  Mild left atrial enlargement.  Aortic valve sclerosis without stenosis.  No pericardial effusion is seen.       ? Right inguinal hernia 12/06/2015   ? Cardiac pacemaker in situ 10/18/2015     ? 10/21/15 Medtronic dual-chamber PPM implantation - Dr. Naoma Diener     ? 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 BMI 31.0-31.9,adult 08/10/2015   ? Primary osteoarthritis of left knee 06/04/2015   ? Visit for preventive health examination 09/05/2012   ? AV block, 2nd degree 07/14/2010   ? Leg cramps, sleep related 04/10/2010   ? 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 Micheline Rough: 10-20% prox-CFX, 20-30% prox-RCA  06/08/2019 - CCTA:  Extensive coronary artery calcifications making the accurate estimation of stenosis difficult.  Normal coronary artery system origins and course. Right dominant system.  The left anterior descending artery with extensive coronary artery calcification in the proximal to mid segments severe stenosis cannot be excluded. The left anterior descending artery is patent in the mid to distal segments and not well-visualized apically. Study will be assessed by FFR to evaluate hemodynamically.  The left circumflex artery has mild to moderate calcific atherosclerotic changes without obvious stenosis.  The right coronary artery has mild to moderate calcific atherosclerotic changes with mild stenosis proximally. The PDA is not visualized distally.   12/04/2020 - ECHO:  Technically difficult study; i.v. transpulmonary contrast was used to define the endocardial borders.  Normal left ventricular systolic function, estimated ejection fraction is 65%.  Abnormal septal motion consistent with right ventricular pacing.  Grade III (severe) left ventricular diastolic dysfunction. Elevated left atrial pressure.  The right ventricular size, wall thickness and systolic function are normal.  Left Atrium: Mildly dilated.  Severe mitral annular calcification, the mitral valve is thickened and calcified, mild stenosis, MG =3 mmHg, trace regurgitation.  Mild tricuspid valve regurgitation.  Estimated Peak Systolic PA Pressure 24 mmHg.         ? HLD (hyperlipidemia) 04/11/2009   ? Essential hypertension 04/11/2009   ? GERD (gastroesophageal reflux disease) 04/11/2009   ? Sensorineural hearing loss 04/11/2009   ? Erectile dysfunction of non-organic origin 04/11/2009   ? Hypothyroid 04/11/2009  Review of Systems   Constitutional: Negative.   HENT: Negative.    Eyes: Negative.    Cardiovascular: Negative.    Respiratory: Negative.    Endocrine: Negative.    Hematologic/Lymphatic: Negative.    Skin: Negative.    Musculoskeletal: Negative.    Gastrointestinal: Negative.    Genitourinary: Negative.    Neurological: Negative.    Psychiatric/Behavioral: Negative. Allergic/Immunologic: Negative.        Physical Exam   Constitutional: He appears well-developed. No distress.   HENT:   Head: Normocephalic and atraumatic.   Mouth/Throat: Mucous membranes are moist.   Eyes: No scleral icterus.   Neck: No JVD present. Carotid bruit is not present.   Cardiovascular: Normal rate and regular rhythm.   No murmur heard.  Pulmonary/Chest: Effort normal and breath sounds normal. No respiratory distress. He has no wheezes.   Abdominal: Soft. Bowel sounds are normal. He exhibits no distension. There is no abdominal tenderness.   Musculoskeletal:      Right lower leg: No edema.      Left lower leg: No edema.   Neurological: He is alert and oriented to person, place, and time.   Skin: Skin is warm and dry. He is not diaphoretic. No pallor.   Psychiatric: His behavior is normal.       Cardiovascular Health Factors  Vitals BP Readings from Last 3 Encounters:   10/16/22 110/80   08/25/22 (!) 162/89   07/07/22 (!) 159/83     Wt Readings from Last 3 Encounters:   10/16/22 83.5 kg (184 lb)   08/25/22 84.5 kg (186 lb 3.2 oz)   07/07/22 83 kg (183 lb)     BMI Readings from Last 3 Encounters:   10/16/22 27.98 kg/m?   08/25/22 27.71 kg/m?   07/07/22 27.22 kg/m?      Smoking Social History     Tobacco Use   Smoking Status Never   Smokeless Tobacco Never      Lipid Profile Cholesterol   Date Value Ref Range Status   03/03/2022 109  Final     HDL   Date Value Ref Range Status   03/03/2022 86  Final     LDL   Date Value Ref Range Status   03/03/2022 11  Final     Triglycerides   Date Value Ref Range Status   03/03/2022 61  Final      Blood Sugar Hemoglobin A1C   Date Value Ref Range Status   08/19/2022 5.5 4.5 - 6.0 % Final     Glucose   Date Value Ref Range Status   08/19/2022 108 65 - 110 mg/dL Final   16/09/9603 540  Final   09/15/2021 96 70 - 100 MG/DL Final   98/10/9146 94 70 - 110 MG/DL Final   82/95/6213 086 (H) 70 - 110 MG/DL Final   57/84/6962 96 70 - 110 MG/DL Final     Glucose Fasting   Date Value Ref Range Status   10/30/2021 100 70 - 100 MG/DL Final     Glucose, POC   Date Value Ref Range Status   05/18/2010 89 70 - 100 MG/DL Final   95/28/4132 440 (H) 70 - 100 MG/DL Final   10/17/2535 644 (H) 70 - 100 MG/DL Final          Problems Addressed Today  Encounter Diagnoses   Name Primary?   ? S/P left atrial appendage ligation Yes   ? Paroxysmal ventricular tachycardia (HCC)    ?  Chronic heart failure with preserved ejection fraction (HCC)    ? 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 AV block, 2nd degree    ? Pure hypercholesterolemia    ? Essential hypertension    ? Cardiac pacemaker in situ    ? Coronary artery disease, non-occlusive    ? Mixed hyperlipidemia    ? Atrial fibrillation, unspecified type (HCC)    ? Hypothyroidism, unspecified type                    Current Medications (including today's revisions)  ? aspirin EC 81 mg tablet Take 1 tablet by mouth daily. Take with food.   ? atorvastatin (LIPITOR) 20 mg tablet Take two tablets by mouth at bedtime daily.   ? CHOLEcalciferoL (vitamin D3) 1,000 units tablet Take one tablet by mouth daily.   ? cyanocobalamin (VITAMIN B-12) 1,000 mcg tablet Take one tablet by mouth daily. Start this the day after taking your one time Vitamin B12 injection.   ? evolocumab (REPATHA SURECLICK) 140 mg/mL injectable PEN Inject 1 mL under the skin every 14 days.   ? ezetimibe (ZETIA) 10 mg tablet Take one tablet by mouth daily.   ? levothyroxine (SYNTHROID) 50 mcg tablet TAKE 1 TABLET BY MOUTH ONCE DAILY   ? lisinopriL (ZESTRIL) 30 mg tablet Take one tablet by mouth daily.   ? OMEGA-3 FATTY ACIDS/FISH OIL (FISH OIL-OMEGA-3 FATTY ACIDS PO) Take 4 tablets by mouth daily.   ? sotaloL (BETAPACE) 120 mg tablet Take one tablet by mouth twice daily.

## 2022-10-16 NOTE — Progress Notes
10/16/2022 2:21 PM   Patient: Jonathan Humphrey, DOB: 04/01/1934    Please send records from the last month.    Please include the following:     Most recent lab report    Please fax to [phone]    Thank you!

## 2022-10-17 DIAGNOSIS — I10 Essential (primary) hypertension: Secondary | ICD-10-CM

## 2022-10-17 DIAGNOSIS — E782 Mixed hyperlipidemia: Secondary | ICD-10-CM

## 2022-10-17 DIAGNOSIS — E7800 Pure hypercholesterolemia, unspecified: Secondary | ICD-10-CM

## 2022-10-19 ENCOUNTER — Encounter: Admit: 2022-10-19 | Discharge: 2022-10-19 | Payer: MEDICARE

## 2022-10-19 DIAGNOSIS — I2510 Atherosclerotic heart disease of native coronary artery without angina pectoris: Secondary | ICD-10-CM

## 2022-10-19 DIAGNOSIS — R7301 Impaired fasting glucose: Secondary | ICD-10-CM

## 2022-10-19 DIAGNOSIS — Z0000 Encounter for general adult medical examination without abnormal findings: Secondary | ICD-10-CM

## 2022-10-19 DIAGNOSIS — E039 Hypothyroidism, unspecified: Secondary | ICD-10-CM

## 2022-10-19 DIAGNOSIS — M1712 Unilateral primary osteoarthritis, left knee: Secondary | ICD-10-CM

## 2022-10-19 DIAGNOSIS — I10 Essential (primary) hypertension: Secondary | ICD-10-CM

## 2022-10-19 DIAGNOSIS — E7800 Pure hypercholesterolemia, unspecified: Secondary | ICD-10-CM

## 2022-10-19 LAB — TSH WITH FREE T4 REFLEX: TSH: 1.5

## 2022-10-19 LAB — CBC AND DIFF
ABSOLUTE LYMPH COUNT: 1.2
WBC COUNT: 6.8 % (ref 41–77)

## 2022-10-19 LAB — LIVER FUNCTION PANEL
ALBUMIN: 4.5
ALK PHOSPHATASE: 54
ALT: 27
AST: 24
DIRECT BILIRUBIN: 0.1
TOTAL BILIRUBIN: 0.3
TOTAL PROTEIN: 6.7

## 2022-10-19 LAB — LIPID PROFILE
CHOLESTEROL/HDL %: 2.7 K/UL (ref 0–0.20)
CHOLESTEROL: 179 % (ref >60)
HDL: 66 K/UL (ref 1.8–7.0)
LDL: 99 K/UL — ABNORMAL LOW (ref 1.0–4.8)
NON HDL CHOLESTEROL: 113 K/UL (ref 0–0.45)
TRIGLYCERIDES: 73 % (ref 0–2)

## 2022-10-19 LAB — BASIC METABOLIC PANEL: SODIUM: 137 % — ABNORMAL LOW (ref 24–44)

## 2022-10-19 LAB — MICROALB/CR RATIO-URINE RANDOM
MICROALBUMIN, RAN: 3.9
MICROALBUMIN/CR RATIO URINE: 6
UR CREATININE, RAN: 70

## 2022-10-19 LAB — HEMOGLOBIN A1C: HEMOGLOBIN A1C: 5.6 % (ref 4–12)

## 2022-10-28 ENCOUNTER — Encounter: Admit: 2022-10-28 | Discharge: 2022-10-28 | Payer: MEDICARE

## 2022-11-17 ENCOUNTER — Encounter: Admit: 2022-11-17 | Discharge: 2022-11-17 | Payer: MEDICARE

## 2022-11-27 ENCOUNTER — Ambulatory Visit: Admit: 2022-11-27 | Discharge: 2022-11-27 | Payer: MEDICARE

## 2022-11-27 ENCOUNTER — Encounter: Admit: 2022-11-27 | Discharge: 2022-11-27 | Payer: MEDICARE

## 2022-11-27 DIAGNOSIS — R42 Dizziness and giddiness: Secondary | ICD-10-CM

## 2022-11-27 DIAGNOSIS — F5221 Male erectile disorder: Secondary | ICD-10-CM

## 2022-11-27 DIAGNOSIS — Z7901 Long term (current) use of anticoagulants: Secondary | ICD-10-CM

## 2022-11-27 DIAGNOSIS — L578 Other skin changes due to chronic exposure to nonionizing radiation: Secondary | ICD-10-CM

## 2022-11-27 DIAGNOSIS — E039 Hypothyroidism, unspecified: Secondary | ICD-10-CM

## 2022-11-27 DIAGNOSIS — Z950 Presence of cardiac pacemaker: Secondary | ICD-10-CM

## 2022-11-27 DIAGNOSIS — L821 Other seborrheic keratosis: Secondary | ICD-10-CM

## 2022-11-27 DIAGNOSIS — R001 Bradycardia, unspecified: Secondary | ICD-10-CM

## 2022-11-27 DIAGNOSIS — L570 Actinic keratosis: Secondary | ICD-10-CM

## 2022-11-27 DIAGNOSIS — I495 Sick sinus syndrome: Secondary | ICD-10-CM

## 2022-11-27 DIAGNOSIS — I10 Essential (primary) hypertension: Secondary | ICD-10-CM

## 2022-11-27 DIAGNOSIS — B353 Tinea pedis: Secondary | ICD-10-CM

## 2022-11-27 DIAGNOSIS — J302 Other seasonal allergic rhinitis: Secondary | ICD-10-CM

## 2022-11-27 DIAGNOSIS — D489 Neoplasm of uncertain behavior, unspecified: Secondary | ICD-10-CM

## 2022-11-27 DIAGNOSIS — H9190 Unspecified hearing loss, unspecified ear: Secondary | ICD-10-CM

## 2022-11-27 DIAGNOSIS — R0989 Other specified symptoms and signs involving the circulatory and respiratory systems: Secondary | ICD-10-CM

## 2022-11-27 DIAGNOSIS — E785 Hyperlipidemia, unspecified: Secondary | ICD-10-CM

## 2022-11-27 DIAGNOSIS — K219 Gastro-esophageal reflux disease without esophagitis: Secondary | ICD-10-CM

## 2022-11-27 DIAGNOSIS — Z9889 Other specified postprocedural states: Secondary | ICD-10-CM

## 2022-11-27 DIAGNOSIS — I4891 Unspecified atrial fibrillation: Secondary | ICD-10-CM

## 2022-11-27 DIAGNOSIS — I480 Paroxysmal atrial fibrillation: Secondary | ICD-10-CM

## 2022-11-27 DIAGNOSIS — M1990 Unspecified osteoarthritis, unspecified site: Secondary | ICD-10-CM

## 2022-11-27 DIAGNOSIS — R5383 Other fatigue: Secondary | ICD-10-CM

## 2022-11-27 DIAGNOSIS — B351 Tinea unguium: Secondary | ICD-10-CM

## 2022-11-27 DIAGNOSIS — I4729 Paroxysmal ventricular tachycardia (HCC): Secondary | ICD-10-CM

## 2022-11-27 DIAGNOSIS — M25569 Pain in unspecified knee: Secondary | ICD-10-CM

## 2022-11-27 DIAGNOSIS — Z823 Family history of stroke: Secondary | ICD-10-CM

## 2022-11-27 DIAGNOSIS — L304 Erythema intertrigo: Secondary | ICD-10-CM

## 2022-11-27 DIAGNOSIS — R519 Generalized headaches: Secondary | ICD-10-CM

## 2022-11-27 DIAGNOSIS — B079 Viral wart, unspecified: Secondary | ICD-10-CM

## 2022-11-27 DIAGNOSIS — H903 Sensorineural hearing loss, bilateral: Secondary | ICD-10-CM

## 2022-11-27 NOTE — Progress Notes
Date of Service: 11/27/2022    Jonathan Humphrey is a 88 y.o. male.       HPI     Jonathan Humphrey presents to my office today in electrophysiology follow-up for history of ventricular arrhythmias.  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  At that time, he was feeling well without any significant complaints.  Unfortunately, he was not taking his sotalol regularly secondary to various schedule issues.  Device interrogation showed one 4.5-second episode of nonsustained ventricular tachycardia that was asymptomatic.  I encouraged him to take his medications more regularly.  We made plans to see each other back in follow-up in 1 year.    Jonathan Humphrey returns to my office today, telling me that he has been doing well.  Unfortunately, his wife died almost a year ago now.  She had atrial fibrillation and a blood clot.  He denies chest discomfort, shortness of breath, dizziness, or syncope.  Patient has had no palpitations.  His biggest issue today is difficulty with his balance.  For the last year and a half, he has had some gait unsteadiness and suffered several falls.  Patient has attended balance classes and this seems to help.  He just does not walk as fast as he used to.  Jonathan Humphrey is not on anticoagulation after left atrial appendage ligation in the setting of surgery for aortic valve fibroelastoma.    Device interrogation today shows stable sensing and pacing thresholds.  He remains in complete heart block with no intrinsic underlying escape rhythm.  Patient is V paced 100% of the time.  He had 1 episode of nonsustained ventricular tachycardia lasting 10 seconds on November 24 at 10 PM.  The ventricular rate was 170 bpm.  Patient denies any symptoms at the time of the event.  In talking to [place], he is still not taking his sotalol regularly.  He estimates that he misses doses in the evening about 2-3 times a week.         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  No residual tissue or communication seen via color-flow Doppler in the typical location of the left atrial appendage. This suggests there is no residual left atrial appendage.  Limited views of left ventricle suggests that it is normal in size with preserved systolic function.  Estimated ejection fraction=60%.  Right ventricle is normal in size with preserved systolic function.  Left atrium is dilated.   Significant mitral annular calcification.  No significant stenosis.  Mean transmitral diastolic pressure gradient 3 mm Hg at 71 bpm.  Mild mitral valve regurgitation.  Mild to moderate tricuspid valve regurgitation.  Moderate calcific atheromatous plaque  in the descending thoracic aorta.  No pericardial effusion.        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  LVEF 65%.  There is paradoxical septal wall motion abnormality.  Normal right ventricular size and systolic function.  Pacer lead was visualized in the right ventricle.  No hemodynamically significant valve disease.  There is moderate mitral annular calcification.  No pericardial effusion.  Mildly dilated sinus of Valsalva.  02/11/2020 - ECHO:  Normal left ventricular size. Prominent basal septum. No outflow tract obstruction. Normal systolic function. Estimated EF ~65%.  Abnormal septal wall motion consistent with right ventricular pacing.  Unable to assess diastolic function.  Normal right ventricular size and systolic function.  The left atrium is mildly dilated.  Normal right atrial size.  Device leads in right-sided chambers.  Moderate calcification of the mitral valve annulus with extension into the leaflets.  Very mild stenosis.  Mean gradient 3-4 mmHg at a heart rate of 78 bpm.  Mild regurgitation.  Aortic valve sclerosis without stenosis.  Trivial regurgitation.  Mild to moderate tricuspid valve regurgitation.  No pericardial effusion.  Estimated pulmonary artery systolic pressure 26 mmHg.   The aortic root is dilated, measuring 3.8 cm at the sinus of Valsalva.  04/30/2021 - ECHO:  A septal wall motion abnormality is noted consistent with right ventricular pacing.  No other regional wall motion abnormalities are identified. Overall left ventricular systolic function appears normal. The estimated left ventricular ejection fraction is 60%. Left ventricular contractility appears similar when compared with the prior echocardiogram performed on 12/04/20.   Mild basal septal hypertrophy is noted. The Doppler recording obtained from the left ventricular outflow tract has normal velocity and profile and does not suggest resting left ventricular outflow tract obstruction.  Grade II (moderate) left ventricular diastolic dysfunction. Elevated left atrial pressure.  The right ventricle is not visualized well. Limited views suggest normal right ventricular size and contractility.  Mild left atrial enlargement.  Aortic valve sclerosis without stenosis.  No pericardial effusion is seen.        Right inguinal hernia 12/06/2015    Cardiac pacemaker in situ 10/18/2015     10/21/15 [employer] dual-chamber PPM implantation - Dr. Naoma Diener      Chronotropic incompetence with sinus node dysfunction (HCC) 10/18/2015    BMI 31.0-31.9,adult 08/10/2015    Primary osteoarthritis of left knee 06/04/2015    Visit for preventive health examination 09/05/2012    AV block, 2nd degree 07/14/2010    Leg cramps, sleep related 04/10/2010    Coronary artery disease, non-occlusive 04/11/2009     05/10/17: Cath by Dr. Micheline Rough: 10-20% prox-CFX, 20-30% prox-RCA  06/08/2019 - CCTA:  Extensive coronary artery calcifications making the accurate estimation of stenosis difficult.  Normal coronary artery system origins and course. Right dominant system.  The left anterior descending artery with extensive coronary artery calcification in the proximal to mid segments severe stenosis cannot be excluded. The left  anterior descending artery is patent in the mid to distal segments and not well-visualized apically. Study will be assessed by FFR to evaluate hemodynamically.  The left circumflex artery has mild to moderate calcific atherosclerotic changes without obvious stenosis.  The right coronary artery has mild to moderate calcific atherosclerotic changes with mild stenosis proximally. The PDA is not visualized distally.   12/04/2020 - ECHO:  Technically difficult study; i.v. transpulmonary contrast was used to define the endocardial borders.  Normal left ventricular systolic function, estimated ejection fraction is 65%.  Abnormal septal motion consistent with right ventricular pacing.  Grade III (severe) left ventricular diastolic dysfunction. Elevated left atrial pressure.  The right ventricular size, wall thickness and systolic function are normal.  Left Atrium: Mildly dilated.  Severe mitral annular calcification, the mitral valve is thickened and calcified, mild stenosis, MG =3 mmHg, trace regurgitation.  Mild tricuspid valve regurgitation.  Estimated Peak Systolic PA Pressure 24 mmHg.          HLD (hyperlipidemia) 04/11/2009    Essential hypertension 04/11/2009    GERD (gastroesophageal reflux disease) 04/11/2009    Sensorineural hearing loss 04/11/2009    Erectile dysfunction of non-organic origin 04/11/2009    Hypothyroid 04/11/2009         Review of Systems   Constitutional: Negative.   HENT: Negative.     Eyes: Negative.    Cardiovascular: Negative.    Respiratory: Negative.     Endocrine: Negative.    Hematologic/Lymphatic: Negative.    Skin: Negative.    Musculoskeletal: Negative.    Gastrointestinal: Negative.    Genitourinary: Negative.    Neurological: Negative.    Psychiatric/Behavioral: Negative.     Allergic/Immunologic: Negative.        Physical Exam  General Appearance: elderly, well nourished in no acute distress  Skin: warm, moist, no ulcers  HEENT: extraocular movements intact, oropharynx clear  Neck Veins: neck veins are flat, neck veins are not distended  Carotid Arteries: normal carotid upstroke bilaterally, no bruits  Chest Inspection: chest is normal in appearance  Auscultation/Percussion: lungs clear to auscultation, no rales, rhonchi, or wheezing  Cardiac Rhythm: regular rhythm and normal rate  Cardiac Auscultation: Normal S1 & S2, no S3 or S4, no rub  Murmurs: no cardiac murmurs   Extremities: no lower extremity edema; 1+ symmetric distal pulses  Abdominal Exam: soft, non-tender, no masses, bowel sounds normal  Liver & Spleen: no organomegaly  Neurologic Exam: neurological assessment grossly intact      Cardiovascular Studies  12 lead EKG:  A sensed, V paced, rate 62 bpm, QTc 554 msec, QRSd 166 msec    Cardiovascular Health Factors  Vitals BP Readings from Last 3 Encounters:   11/27/22 (!) 148/74   10/16/22 110/80   08/25/22 (!) 162/89     Wt Readings from Last 3 Encounters:   11/27/22 86.1 kg (189 lb 12.8 oz)   10/16/22 83.5 kg (184 lb)   08/25/22 84.5 kg (186 lb 3.2 oz)     BMI Readings from Last 3 Encounters:   11/27/22 28.86 kg/m?   10/16/22 27.98 kg/m?   08/25/22 27.71 kg/m?      Smoking Social History     Tobacco Use   Smoking Status Never   Smokeless Tobacco Never      Lipid Profile Cholesterol   Date Value Ref Range Status   10/15/2022 179  Final     HDL   Date Value Ref Range Status   10/15/2022 66  Final  LDL   Date Value Ref Range Status   10/15/2022 99  Final     Triglycerides   Date Value Ref Range Status   10/15/2022 73  Final      Blood Sugar Hemoglobin A1C   Date Value Ref Range Status   10/15/2022 5.6  Final     Glucose   Date Value Ref Range Status   10/15/2022 104  Final   08/19/2022 108 65 - 110 mg/dL Final   16/09/9603 540  Final   01/06/2006 94 70 - 110 MG/DL Final   98/10/9146 829 (H) 70 - 110 MG/DL Final   56/21/3086 96 70 - 110 MG/DL Final     Glucose Fasting   Date Value Ref Range Status   10/30/2021 100 70 - 100 MG/DL Final     Glucose, POC   Date Value Ref Range Status   05/18/2010 89 70 - 100 MG/DL Final   57/84/6962 952 (H) 70 - 100 MG/DL Final   84/13/2440 102 (H) 70 - 100 MG/DL Final          Problems Addressed Today  Encounter Diagnoses   Name Primary?    Cardiac pacemaker in situ Yes    Cardiovascular symptoms     Paroxysmal ventricular tachycardia (HCC)        Assessment and Plan       Paroxysmal ventricular tachycardia  He continues to have episodes of nonsustained ventricular tachycardia despite initiation of sotalol.  His last episode lasted 10 seconds on November 24 at 10 PM.  Patient does not recall symptoms at the time.    In talking to [place], I do not think that he is taking his sotalol consistently.  He reports missing the evening dose at least 2-3 times a week.  This could certainly explain some of his residual nonsustained ventricular tachycardia.  We talked about strategies to try and remember taking his evening dose.  I also discussed possibly switching his sotalol to amiodarone.  After a long discussion, Jonathan Humphrey is going to try again to take his medications more regularly.  If we continue to see more episodes of nonsustained VT, then I would simply switch him over to low-dose amiodarone.  I would also consider repeating stress testing.    Cardiac pacemaker in situ  Device interrogation today shows stable sensing and pacing thresholds.  He has had no atrial arrhythmias.  Patient is ventricular paced 100% of the time with no underlying intrinsic rhythm in complete heart block.  I did not make any changes to his programming today.    He is 100% V paced.  Last echocardiogram revealed normal LV function.  He will need a repeat echocardiogram next year.    I have asked the patient to see me back in follow up in one year.        Current Medications (including today's revisions)   aspirin EC 81 mg tablet Take 1 tablet by mouth daily. Take with food.    atorvastatin (LIPITOR) 20 mg tablet Take two tablets by mouth at bedtime daily.    CHOLEcalciferoL (vitamin D3) 1,000 units tablet Take one tablet by mouth daily.    cyanocobalamin (VITAMIN B-12) 1,000 mcg tablet Take one tablet by mouth daily. Start this the day after taking your one time Vitamin B12 injection.    ezetimibe (ZETIA) 10 mg tablet Take one tablet by mouth daily.    levothyroxine (SYNTHROID) 50 mcg tablet TAKE 1 TABLET BY MOUTH ONCE DAILY    lisinopriL (  ZESTRIL) 30 mg tablet Take one tablet by mouth daily.    OMEGA-3 FATTY ACIDS/FISH OIL (FISH OIL-OMEGA-3 FATTY ACIDS PO) Take 4 tablets by mouth daily.    sotaloL (BETAPACE) 120 mg tablet Take one tablet by mouth twice daily.

## 2022-11-27 NOTE — Assessment & Plan Note
He continues to have episodes of nonsustained ventricular tachycardia despite initiation of sotalol.  His last episode lasted 10 seconds on November 24 at 10 PM.  Patient does not recall symptoms at the time.    In talking to [name], I do not think that he is taking his sotalol consistently.  He reports missing the evening dose at least 2-3 times a week.  This could certainly explain some of his residual nonsustained ventricular tachycardia.  We talked about strategies to try and remember taking his evening dose.  I also discussed possibly switching his sotalol to amiodarone.  After a long discussion, Jonathan Humphrey is going to try again to take his medications more regularly.  If we continue to see more episodes of nonsustained VT, then I would simply switch him over to low-dose amiodarone.  I would also consider repeating stress testing.

## 2022-12-07 ENCOUNTER — Encounter: Admit: 2022-12-07 | Discharge: 2022-12-07 | Payer: MEDICARE

## 2022-12-09 ENCOUNTER — Encounter: Admit: 2022-12-09 | Discharge: 2022-12-09 | Payer: MEDICARE

## 2022-12-09 DIAGNOSIS — Z950 Presence of cardiac pacemaker: Secondary | ICD-10-CM

## 2022-12-09 NOTE — Telephone Encounter
Called to discuss and had to leave VM to call back.

## 2022-12-09 NOTE — Telephone Encounter
-----   Message from [place] sent at 12/09/2022  7:05 AM CST -----  Regarding: RCP: Remote alert.  A Yellow Alert was reported by the device:    Atrial automatic threshold detected as > programmed amplitude or suspended.  An automatic safety switch from bipolar to unipolar occurred on Dec 05, 2022 due to an RA Pace Impedance measurement of >3000 ?Marland Kitchen This was flagged 12/07/22.    Today's alert was for RA threshold has been unable to measure since switch to Unipolar Pace/Sense recently and this has caused RA output to adapt to 5V.    Additional Notes:  Current rhythm AP-VP 60 bpm. 6 ATR mode switch episodes, longest 20 seconds. Available egms appear to show atrial noise. Myopotential oversensing versus lead noise?    Please review wit RCP. Patient probably needs a RCP OV and device check for A lead evaluation.

## 2023-01-08 ENCOUNTER — Ambulatory Visit: Admit: 2023-01-08 | Discharge: 2023-01-08 | Payer: MEDICARE

## 2023-01-08 ENCOUNTER — Encounter: Admit: 2023-01-08 | Discharge: 2023-01-08 | Payer: MEDICARE

## 2023-01-08 DIAGNOSIS — Z950 Presence of cardiac pacemaker: Secondary | ICD-10-CM

## 2023-01-10 ENCOUNTER — Encounter: Admit: 2023-01-10 | Discharge: 2023-01-10 | Payer: MEDICARE

## 2023-02-11 ENCOUNTER — Encounter: Admit: 2023-02-11 | Discharge: 2023-02-11 | Payer: MEDICARE

## 2023-03-02 IMAGING — CT CT lumbar spine wo con
3 series · 15 of 33 positions shown, 18 images · non-contrast
Comparison: None

REASON FOR EXAM: Lumbar ago with sciatica, left-sided. M [age]. Initial encounter.
TECHNIQUE: Helical thin slice noncontrast-enhanced CT images were obtained through the lumbar spine.
Postprocessed coronal and sagittal reformats were reviewed.

[Series 4: soft tissue wo · axial · 0.33mm/px · z∈[-342,-30]mm · 7 of 149 slices shown, 9 images]
[im 12/149  soft-tissue]
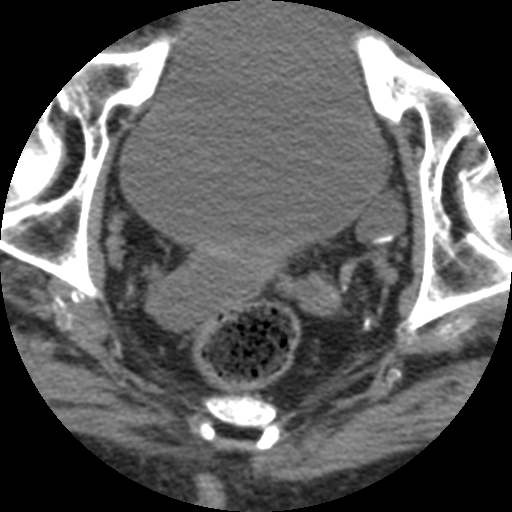
[im 12/149  bone]
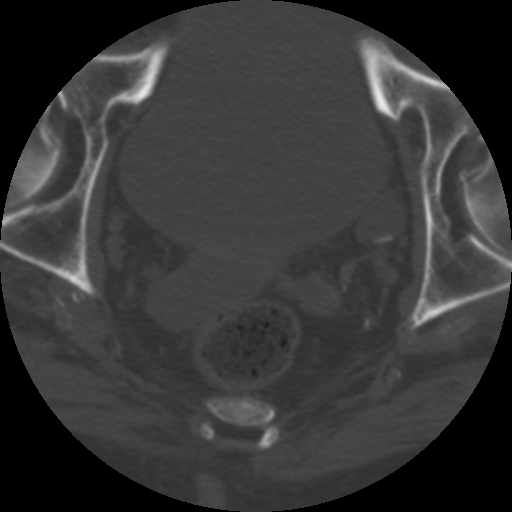
[im 35/149  bone]
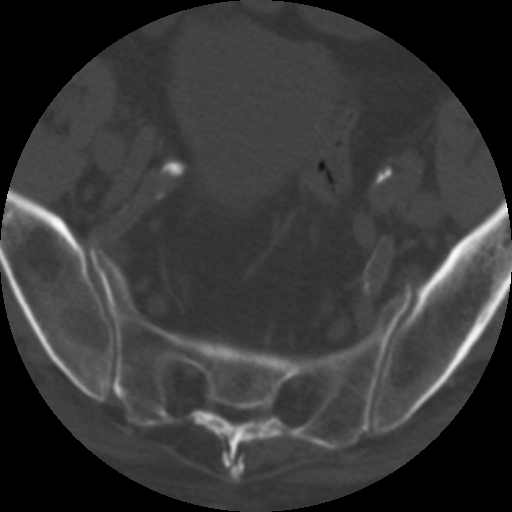
[im 57/149  bone]
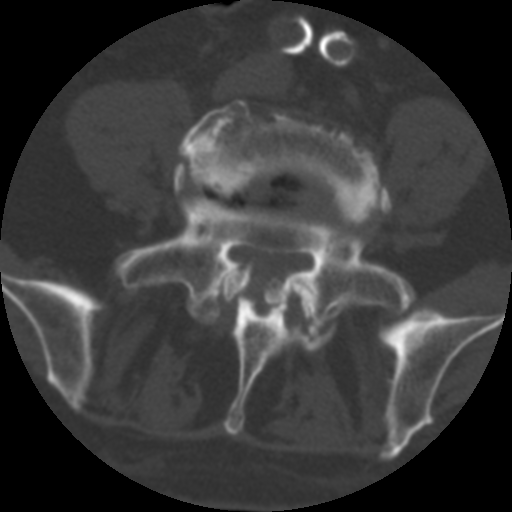
[im 80/149  bone]
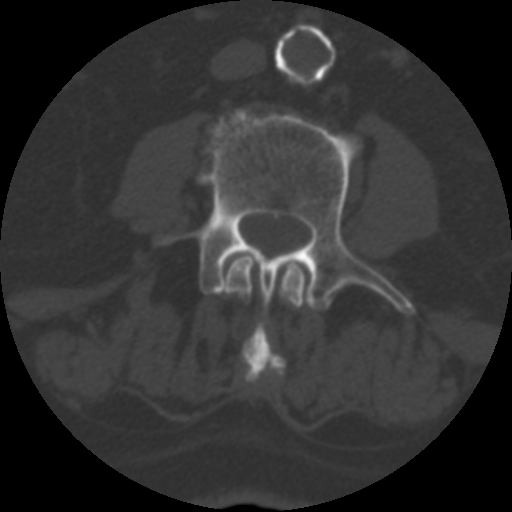
[im 92/149  soft-tissue]
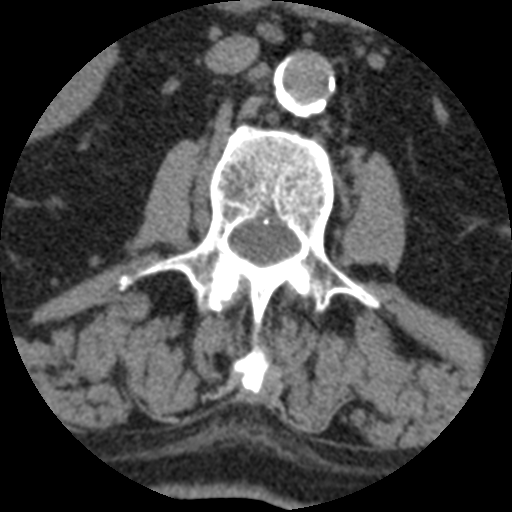
[im 92/149  bone]
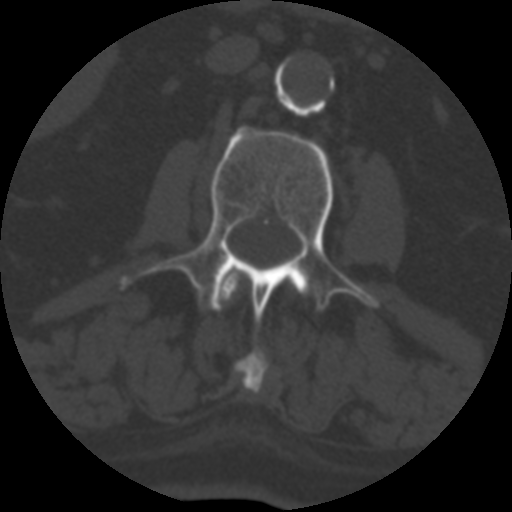
[im 114/149  bone]
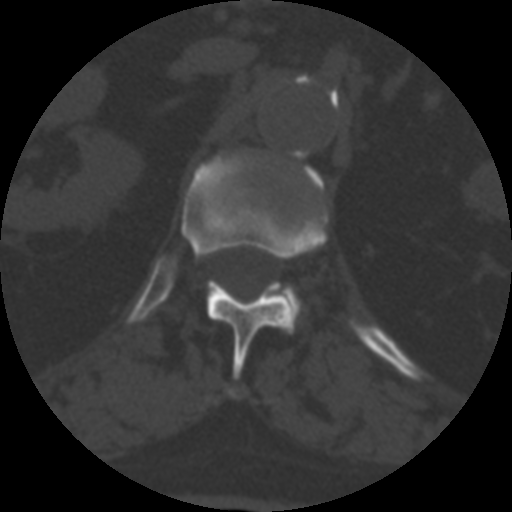
[im 137/149  bone]
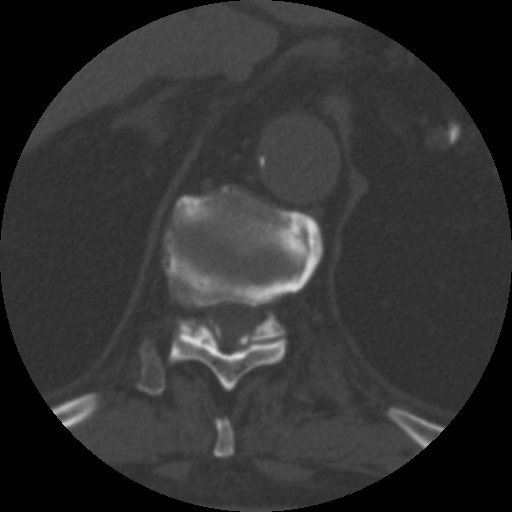

[Series 1003: coronal soft tissue wo · coronal · 0.73mm/px · 3 of 83 slices shown]
[im 17/83  bone]
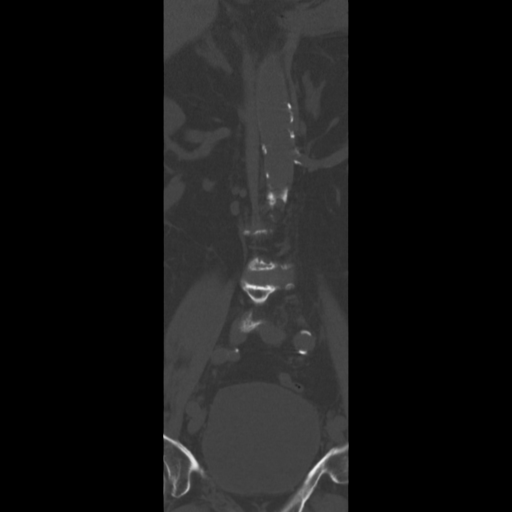
[im 33/83  bone]
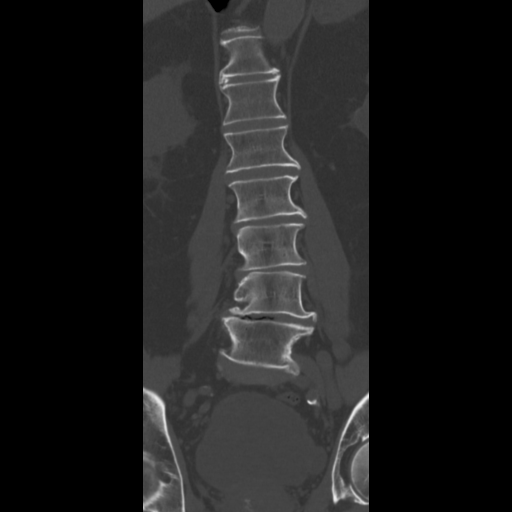
[im 50/83  bone]
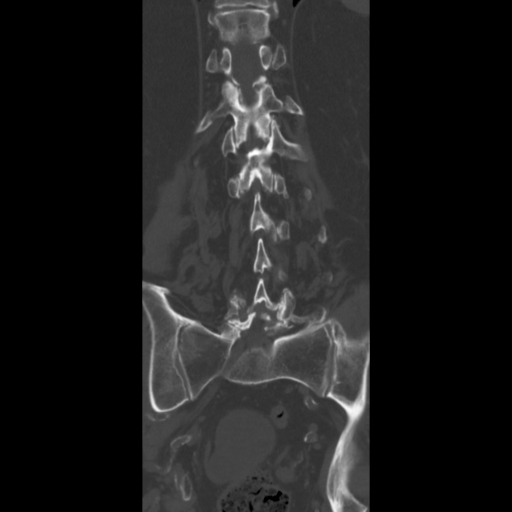

[Series 1004: sagittal soft tissue wo · sagittal · 0.73mm/px · 5 of 83 slices shown, 6 images]
[im 28/83  bone]
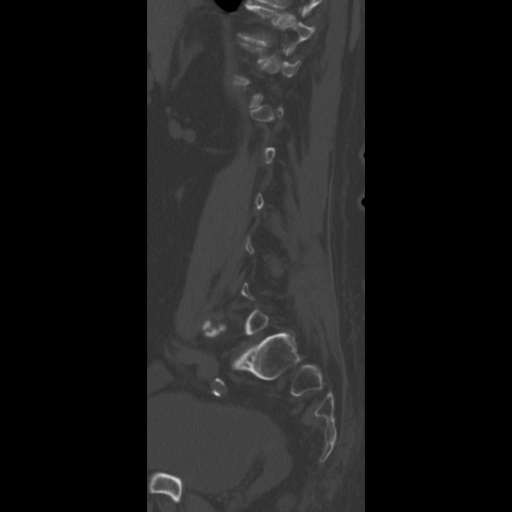
[im 35/83  bone]
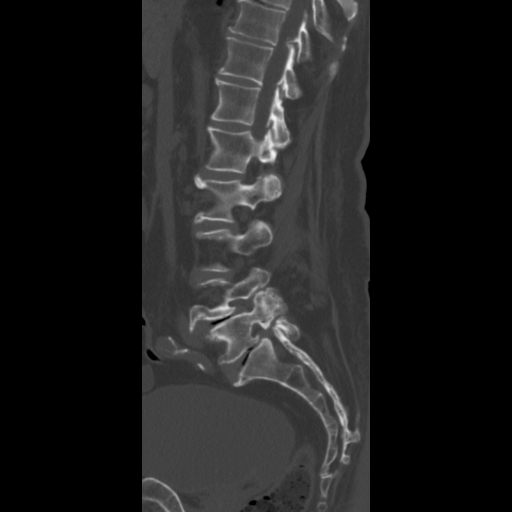
[im 42/83  soft-tissue]
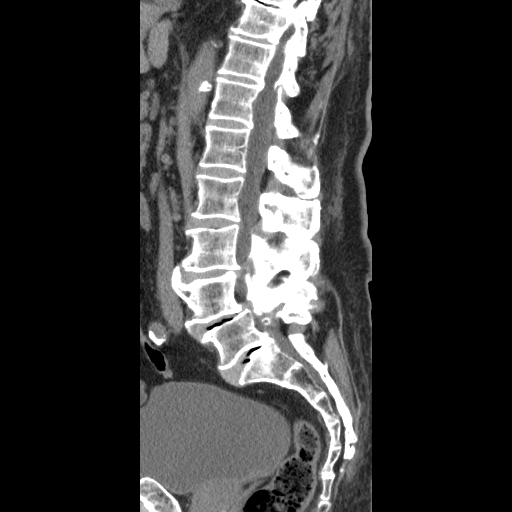
[im 42/83  bone]
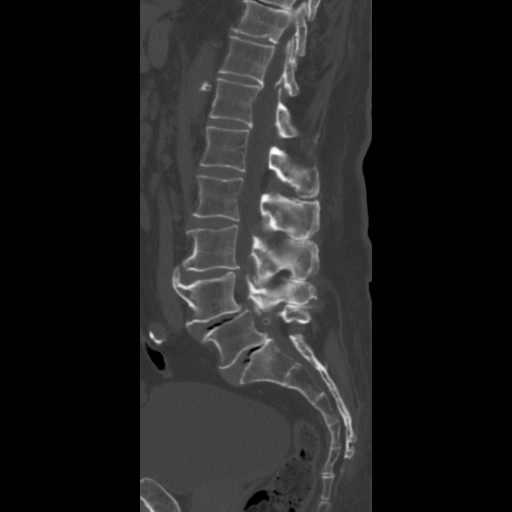
[im 48/83  bone]
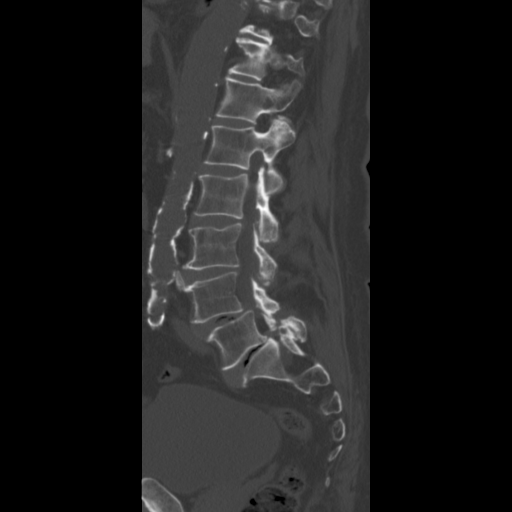
[im 55/83  bone]
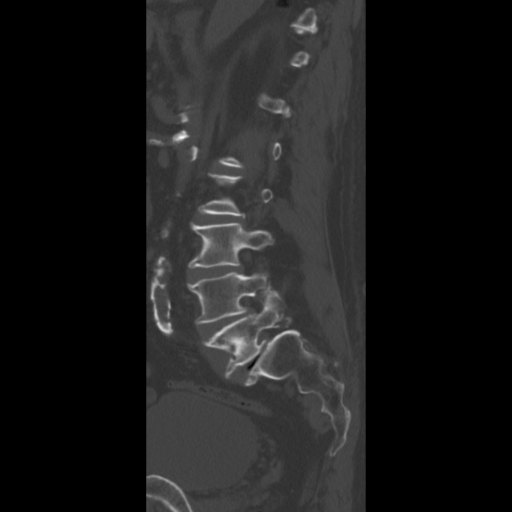

[15 of 33 positions shown; findings below may reference images not displayed]

FINDINGS: For the purposes of this examination, there are 5 lumbar-type vertebral bodies with the last
well-formed disc space designated as L5-S1. Mild levoconvex scoliosis of the thoracolumbar spine is
noted. There is grade 1 retrolisthesis of L3 on L4 and a grade 1 anterolisthesis of L4 on L5 and L5
on S1. Bilateral L5 pars defects are present.
The vertebral bodies are normal in height. There is no acute fracture. Degenerative changes of the
bilateral SI joints are noted with osseous fusion.
There are no large prevertebral or paraspinal masses. Aortic and iliac atherosclerosis is noted
without evidence of aneurysm. Urinary bladder is moderately distended with diverticula arising from
the left and posterior aspect of the urinary bladder. There is linear calcification of the wall of
the left lateral diverticulum.
Multilevel disc space loss is present with vacuum disc present at L4-5 and L5-S1.
Level by level are as follows:
T12-L1: No significant disc bulge or herniation. No spinal canal or neuroforaminal stenosis.
L1-L2: No significant disc bulge or herniation. Facet arthropathy and ligamentum flavum infolding.
No spinal canal or neuroforaminal stenosis.
L2-L3: Diffuse bulging annulus, facet arthropathy, and ligamentum flavum infolding result in
moderate spinal canal stenosis and minimal bilateral neuroforaminal narrowing.
L3-L4: Posterior disc osteophyte complex, facet arthropathy, and ligamentum flavum infolding result
in moderate spinal canal stenosis and mild right neuroforaminal narrowing.
L4-L5: Diffuse bulging annulus, facet arthropathy, and ligamentum flavum infolding result in marked
spinal canal stenosis and severe right neuroforaminal stenosis.
L5-S1: Posterior disc osteophyte complex and facet arthropathy result in at least moderate spinal
canal stenosis and severe right/moderate left neuroforaminal stenosis.
IMPRESSION: 1. Multilevel degenerative changes of the lumbar spine with multilevel spinal canal and
neuroforaminal stenosis as described.
2. Bilateral L5 pars defects with associated spondylolisthesis.
3. No acute fracture or dislocation in the lumbar spine.
4. Urinary bladder diverticula.

## 2023-03-15 ENCOUNTER — Encounter: Admit: 2023-03-15 | Discharge: 2023-03-15 | Payer: MEDICARE

## 2023-03-15 DIAGNOSIS — E785 Hyperlipidemia, unspecified: Secondary | ICD-10-CM

## 2023-03-15 DIAGNOSIS — I10 Essential (primary) hypertension: Secondary | ICD-10-CM

## 2023-03-15 NOTE — Telephone Encounter
LVM on nurse line, has appt w/MJP tomorrow, questions if needs labs checked prior to appt. Asked TCBLVM and will have done prior to appt tomorrow. LVM labs ordered and can go to any [facility] facility to have drawn.

## 2023-05-03 ENCOUNTER — Encounter: Admit: 2023-05-03 | Discharge: 2023-05-03 | Payer: MEDICARE

## 2023-05-03 NOTE — Telephone Encounter
05/03/2023 9:34 AM   Pt called wanting to verify if he does have an appt with [facility]JP this Friday. He was placed on cancellation list from July but keeps getting notifications and wants to be sure he has this appt.     Spoke with pt and confirmed he is on the schedule for this Friday at 9am at [facility] with [facility]. Pt voiced understanding. Pt inquiring about labs prior to this appt. He is due for CMP/FLP. Orders faxed to [facility] at [phone] per pt's request. Pt aware of fasting restriction.

## 2023-05-07 ENCOUNTER — Ambulatory Visit: Admit: 2023-05-07 | Discharge: 2023-05-07 | Payer: MEDICARE

## 2023-05-07 ENCOUNTER — Encounter: Admit: 2023-05-07 | Discharge: 2023-05-07 | Payer: MEDICARE

## 2023-05-07 DIAGNOSIS — R42 Dizziness and giddiness: Secondary | ICD-10-CM

## 2023-05-07 DIAGNOSIS — R2689 Other abnormalities of gait and mobility: Secondary | ICD-10-CM

## 2023-05-07 DIAGNOSIS — L304 Erythema intertrigo: Secondary | ICD-10-CM

## 2023-05-07 DIAGNOSIS — I495 Sick sinus syndrome: Secondary | ICD-10-CM

## 2023-05-07 DIAGNOSIS — Z136 Encounter for screening for cardiovascular disorders: Secondary | ICD-10-CM

## 2023-05-07 DIAGNOSIS — L821 Other seborrheic keratosis: Secondary | ICD-10-CM

## 2023-05-07 DIAGNOSIS — E785 Hyperlipidemia, unspecified: Secondary | ICD-10-CM

## 2023-05-07 DIAGNOSIS — M25569 Pain in unspecified knee: Secondary | ICD-10-CM

## 2023-05-07 DIAGNOSIS — Z9889 Other specified postprocedural states: Secondary | ICD-10-CM

## 2023-05-07 DIAGNOSIS — Z823 Family history of stroke: Secondary | ICD-10-CM

## 2023-05-07 DIAGNOSIS — H9190 Unspecified hearing loss, unspecified ear: Secondary | ICD-10-CM

## 2023-05-07 DIAGNOSIS — L578 Other skin changes due to chronic exposure to nonionizing radiation: Secondary | ICD-10-CM

## 2023-05-07 DIAGNOSIS — I441 Atrioventricular block, second degree: Secondary | ICD-10-CM

## 2023-05-07 DIAGNOSIS — I10 Essential (primary) hypertension: Secondary | ICD-10-CM

## 2023-05-07 DIAGNOSIS — I480 Paroxysmal atrial fibrillation: Secondary | ICD-10-CM

## 2023-05-07 DIAGNOSIS — R001 Bradycardia, unspecified: Secondary | ICD-10-CM

## 2023-05-07 DIAGNOSIS — B079 Viral wart, unspecified: Secondary | ICD-10-CM

## 2023-05-07 DIAGNOSIS — R5383 Other fatigue: Secondary | ICD-10-CM

## 2023-05-07 DIAGNOSIS — I4891 Unspecified atrial fibrillation: Secondary | ICD-10-CM

## 2023-05-07 DIAGNOSIS — Z7901 Long term (current) use of anticoagulants: Secondary | ICD-10-CM

## 2023-05-07 DIAGNOSIS — Z950 Presence of cardiac pacemaker: Secondary | ICD-10-CM

## 2023-05-07 DIAGNOSIS — E7800 Pure hypercholesterolemia, unspecified: Secondary | ICD-10-CM

## 2023-05-07 DIAGNOSIS — J302 Other seasonal allergic rhinitis: Secondary | ICD-10-CM

## 2023-05-07 DIAGNOSIS — B353 Tinea pedis: Secondary | ICD-10-CM

## 2023-05-07 DIAGNOSIS — E039 Hypothyroidism, unspecified: Secondary | ICD-10-CM

## 2023-05-07 DIAGNOSIS — M1990 Unspecified osteoarthritis, unspecified site: Secondary | ICD-10-CM

## 2023-05-07 DIAGNOSIS — I5032 Chronic diastolic (congestive) heart failure: Secondary | ICD-10-CM

## 2023-05-07 DIAGNOSIS — K219 Gastro-esophageal reflux disease without esophagitis: Secondary | ICD-10-CM

## 2023-05-07 DIAGNOSIS — B351 Tinea unguium: Secondary | ICD-10-CM

## 2023-05-07 DIAGNOSIS — L570 Actinic keratosis: Secondary | ICD-10-CM

## 2023-05-07 DIAGNOSIS — F5221 Male erectile disorder: Secondary | ICD-10-CM

## 2023-05-07 DIAGNOSIS — D489 Neoplasm of uncertain behavior, unspecified: Secondary | ICD-10-CM

## 2023-05-07 DIAGNOSIS — R519 Generalized headaches: Secondary | ICD-10-CM

## 2023-05-07 DIAGNOSIS — I4729 Paroxysmal ventricular tachycardia (HCC): Secondary | ICD-10-CM

## 2023-05-07 LAB — COMPREHENSIVE METABOLIC PANEL
ALBUMIN: 3.7
ALK PHOSPHATASE: 55
BLD UREA NITROGEN: 9
CALCIUM: 8.9
CHLORIDE: 105
GLUCOSE,PANEL: 105
POTASSIUM: 4.3 — ABNORMAL HIGH (ref 40–60)
SODIUM: 141
TOTAL BILIRUBIN: 1.1 — ABNORMAL HIGH (ref 0.2–1)
TOTAL PROTEIN: 6.9

## 2023-05-07 LAB — LIPID PROFILE: CHOLESTEROL: 144

## 2023-05-07 NOTE — Progress Notes
Date of Service: 05/07/2023    Jonathan Humphrey is a 89 y.o. male.          History of present illness: Jonathan Humphrey is a very pleasant 89 y.o. male who is being seen at the [facility] Department at the [facility] office.  He also follows with Dr. [name] in the EP clinic.    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       Jonathan Humphrey presented for a routine follow-up. The patient reported no new health concerns or symptoms. He has been compliant with his current medication regimen, which includes atorvastatin and Zetia for cholesterol management, and Sotalol for arrhythmia control.  Previously had some issues with medication adherence, especially in setting of his wife's passing who previously assisted with his regiment.  The patient noted that he has discontinued the use of Repatha due to cost concerns but has been doing well with statin and Zetia.    The patient reported no symptoms of chest pain, trouble breathing, dizziness, or fluid retention. However, he did express concerns about balance issues, which have led to several falls over the past few years. The patient has been using a cane for assistance in recent months due to these balance problems. He has previously undergone balance therapy, which provided some strategies for managing the issue but did not resolve it completely.    The patient also expressed dissatisfaction with the current appointment scheduling system and is considering seeking care at a different facility. He has no immediate health concerns and is scheduled for an echocardiogram in November.       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    Plan:       Hyperlipidemia: LDL 71, slightly above goal of <70. Previously on Repatha, but discontinued due to cost. Currently managed with Atorvastatin and Zetia, which is cost-effective and nearly achieving goal.  -Continue Atorvastatin and Zetia.    Hypertension: Blood pressure  improved from previous readings.  -Continue current antihypertensive regimen.    Pacemaker in situ  Ventricular Tachycardia: Few episodes of nonsustained ventricular tachycardia noted on pacemaker readings, but significantly reduced compared to previous (when he was not as adherent to Sotalol). Patient is asymptomatic.  -Continue Sotalol for rhythm control.  -Consider repeat ischemic eval and Amiodarone if frequency of episodes increases.  -He is set to follow-up with EP in November with a repeat echocardiogram and device interrogation.    Balance Issues: Patient reports balance issues necessitating a cane. No loss of consciousness or leg weakness reported. Patient has undergone balance therapy.  -No changes to current management.  Will need to be slightly more liberal with blood pressure control going forward.    General Health Maintenance / Followup Plans:  -Scheduled for echocardiogram in November.  -Follow-up appointment with Nurse Practitioner Levonne Hubert in November.  -Return visit with cardiologist in approximately one year,  unless changes in symptoms or echocardiogram results.       He will continue aspirin 81 mg daily.    We discussed ongoing lifestyle modifications     Thank you for allowing [place] to care for this patient. If there are any questions or concerns, please don't hesitate to contact us.     We will see the patient back in 12 months time, or sooner if needed.     Jonathan Lea, DO, [facility]  Staff Cardiologist  Department of Cardiovascular Medicine  [facility]      Latest cardiovascular testing includes:    Most recent results for 12-Lead ECG   ECG 12-LEAD    Collection Time: 05/07/23  9:14 AM   Result Value Status    VENTRICULAR RATE 60 Final    P-R INTERVAL 198 Final    QRS DURATION 160 Final    Q-T INTERVAL 550 Final    QTC CALCULATION (BAZETT) 550 Final    P AXIS  Final    R AXIS 80 Final    T AXIS 15 Final    Impression    AV dual-paced rhythm  Abnormal ECG  Confirmed by Jonathan Humphrey ([id number]) on 05/07/2023 9:18:21 AM     *Note: Due to a large number of results and/or encounters for the requested time period, some results have not been displayed. A complete set of results can be found in Results Review.     Last Stress test: 01/10/2019:  TID Ratio:   0.8    (Normal <1.36).     Summed Stress Score:  0    , Summed Rest Score:  0     The left ventricular cavity size is within normal limits. Transient ischemic dilatation of the left ventricle cavity is not observed. Pulmonary tracer uptake is normal.      Tomographic:    Post stress projection and tomographic reconstructions in the supine and upright positions demonstrate equivocal reduction in radioisotope uptake in the inferior apex, but these findings are only observed on the stress upright images. The stress supine images show uniform tracer uptake & no perfusion abnormalities. The stress upright findings are likely due to attenuation artifact. Overall, there are no definite fixed or reversible perfusion abnormalities.  All myocardial segments appear viable. Left ventricular cavity size is within normal limits. Transient ischemic dilatation of the left ventricle cavity is not observed. Pulmonary tracer uptake appears normal.     Quantitative polar coordinate maps identify no perfusion defects.         Regional Wall Thickening and Motion Post Stress:    Review of the post-stress gated images demonstrate normal wall motion and thickening of all myocardial segments. Regional and global left ventricular systolic function are normal.     Left Ventricular Ejection Fraction (post stress, in the resting state) = 89 %.     Left Ventricular End Diastolic Volume: 46 mL.     SUMMARY/OPINION:    This study is normal and represents low probability for significant inducible myocardial ischemia. There are no definite perfusion abnormalities. All myocardial segments appear viable. Regional and global left ventricular function are normal. High risk scintigraphic features are absent.      This study was compared to the previous one performed on 08/14/2016. In qualitative comparison, the two perfusion patterns appear unchanged as the previous study also had no definite perfusion abnormalities and overall was a low risk study. The previous LVEF was 73 % with an LVEDV of 54 mL.  There are no other significant interval changes.     In aggregate the current study is low risk in regards to predicted annual cardiovascular mortality rate.    Last heart catheterization: 07/05/2019:  LEFT HEART CATHETERIZATION:    Aortic pressure:  99/62 with a mean of 77 mmHg.  LV systolic pressure of 101 mmHg.  LVEDP of 10-12 mmHg.     SELECTIVE CORONARY ANGIOGRAPHY:    Left main coronary artery:  The left main coronary artery arises normally from the left coronary sinus.  It distally bifurcates into the left anterior descending artery and left circumflex artery.  It is angiographically free of significant disease.  Left anterior descending artery:  Left anterior descending artery appears to be a large-caliber vessel which gives rise to 2 diagonal branches.  The LAD is moderately calcified in the midportion and has 30% disease at the takeoff of the first diagonal.  The diagonal branches appear to be free of significant disease.  Left circumflex artery:  The left main artery has 30% disease at its takeoff.  It gives rise to 2 obtuse marginal branches.  The remaining of the circumflex, including the OM branches, appear to be free of significant disease.  Right coronary artery:  The right coronary artery arises normally from the right coronary sinus.  It distally bifurcates into PDA and PL branches.  The RCA has a 20% disease in the midportion, otherwise free of significant disease.      IMPRESSION:    Mild coronary artery disease as described above, unchanged from prior study.  Normal LVEDP.  No significant gradient across the aortic valve on pullback.     RECOMMENDATIONS:  Continue medical therapy and aggressive lifestyle risk factor modification.    Last echocardiogram: TEE on 10/02/2021:  Post left atrial appendage ligation.  No residual tissue or communication seen via color-flow Doppler in the typical location of the left atrial appendage. This suggests there is no residual left atrial appendage.  Limited views of left ventricle suggests that it is normal in size with preserved systolic function.  Estimated ejection fraction=60%.   Right ventricle is normal in size with preserved systolic function  left atrium is dilated.    Significant mitral annular calcification.  No significant stenosis.  Mean transmitral diastolic pressure gradient 3 mm Hg at 71 bpm.  Mild mitral valve regurgitation.   Mild to moderate tricuspid valve regurgitation.  Moderate calcific atheromatous plaque in the descending thoracic aorta.  No pericardial effusion.     Has a prior transesophageal echocardiogram done in 2011.  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  There has been interval resection of the left atrial appendage.      Last Lipid profile and pertinent labs:   Lab Level  Lab Results   Component Value Date    CHOL 144 05/06/2023     Lab Results   Component Value Date    LDL 71 05/06/2023     No results found for: LIPOPROTA  Lab Results   Component Value Date    HDL 63 (H) 05/06/2023     Lab Results   Component Value Date    NONHDLCHOL 81 05/06/2023     Lab Results   Component Value Date    TRIG 51 05/06/2023       Orders Placed This Encounter    ECG 12-LEAD       Total time spent on today's office visit was >41 minutes  including >50% of time time involved in counseling.  This includes face-to-face in person visit with patient as well as nonface-to-face time including review of the EMR, outside records, labs, radiologic studies, echocardiogram & other cardiovascular studies, formation of treatment plan, after visit summary, future disposition, and lastly on documentation.    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  No residual tissue or communication seen via color-flow Doppler in the typical location of the left atrial appendage. This suggests there is no residual left atrial appendage.  Limited views of left ventricle suggests that it is normal in size with preserved systolic function.  Estimated ejection fraction=60%.  Right ventricle is normal in size with preserved systolic function.  Left atrium is dilated.   Significant mitral annular calcification.  No significant stenosis.  Mean transmitral diastolic pressure gradient 3 mm Hg at 71 bpm.  Mild mitral valve regurgitation.  Mild to moderate tricuspid valve regurgitation. Moderate calcific atheromatous plaque in the descending thoracic aorta.  No pericardial effusion.        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  LVEF 65%.  There is paradoxical septal wall motion abnormality.  Normal right ventricular size and systolic function.  Pacer lead was visualized in the right ventricle.  No hemodynamically significant valve disease.  There is moderate mitral annular calcification.  No pericardial effusion.  Mildly dilated sinus of Valsalva.  02/11/2020 - ECHO:  Normal left ventricular size. Prominent basal septum. No outflow tract obstruction. Normal systolic function. Estimated EF ~65%.  Abnormal septal wall motion consistent with right ventricular pacing.  Unable to assess diastolic function.  Normal right ventricular size and systolic function.  The left atrium is mildly dilated.  Normal right atrial size.  Device leads in right-sided chambers.  Moderate calcification of the mitral valve annulus with extension into the leaflets.  Very mild stenosis.  Mean gradient 3-4 mmHg at a heart rate of 78 bpm.  Mild regurgitation.  Aortic valve sclerosis without stenosis.  Trivial regurgitation.  Mild to moderate tricuspid valve regurgitation.  No pericardial effusion.  Estimated pulmonary artery systolic pressure 26 mmHg.   The aortic root is dilated, measuring 3.8 cm at the sinus of Valsalva.  04/30/2021 - ECHO:  A septal wall motion abnormality is noted consistent with right ventricular pacing.  No other regional wall motion abnormalities are identified. Overall left ventricular systolic function appears normal. The estimated left ventricular ejection fraction is 60%. Left ventricular contractility appears similar when compared with the prior echocardiogram performed on 12/04/20.   Mild basal septal hypertrophy is noted. The Doppler recording obtained from the left ventricular outflow tract has normal velocity and profile and does not suggest resting left ventricular outflow tract obstruction.  Grade II (moderate) left ventricular diastolic dysfunction. Elevated left atrial pressure.  The right ventricle is not visualized well. Limited views suggest normal right ventricular size and contractility.  Mild left atrial enlargement.  Aortic valve sclerosis without stenosis.  No pericardial effusion is seen.        Right inguinal hernia 12/06/2015    Cardiac pacemaker in situ 10/18/2015     10/21/15 Medtronic dual-chamber PPM implantation - Dr. Naoma Diener      Chronotropic incompetence with sinus node dysfunction 10/18/2015    BMI 31.0-31.9,adult 08/10/2015    Primary osteoarthritis of left knee 06/04/2015    Visit for preventive health examination 09/05/2012    AV block, 2nd degree 07/14/2010    Leg cramps, sleep related 04/10/2010    Coronary artery disease, non-occlusive 04/11/2009     05/10/17: Cath by Dr. Micheline Rough: 10-20% prox-CFX, 20-30% prox-RCA  06/08/2019 - CCTA:  Extensive coronary artery calcifications making the accurate estimation of stenosis difficult.  Normal coronary artery system origins and course. Right dominant system.  The left anterior descending artery with extensive coronary artery calcification in the proximal to mid segments severe stenosis cannot be excluded. The left anterior descending artery is patent in the mid to distal segments and not well-visualized apically. Study will be assessed by FFR to evaluate hemodynamically.  The left circumflex artery has mild to moderate calcific atherosclerotic changes without obvious stenosis.  The right coronary artery has mild to moderate calcific atherosclerotic changes with mild stenosis proximally. The PDA is not visualized distally.   12/04/2020 - ECHO:  Technically difficult study; i.v. transpulmonary contrast was used to define the endocardial borders.  Normal left ventricular systolic function, estimated ejection fraction is 65%.  Abnormal septal motion consistent with right ventricular pacing.  Grade III (severe) left ventricular diastolic dysfunction. Elevated left atrial pressure.  The right ventricular size, wall thickness and systolic function are normal.  Left Atrium: Mildly dilated.  Severe mitral annular calcification, the mitral valve is thickened and calcified, mild stenosis, MG =3 mmHg, trace regurgitation.  Mild tricuspid valve regurgitation.  Estimated Peak Systolic PA Pressure 24 mmHg.          HLD (hyperlipidemia) 04/11/2009    Essential hypertension 04/11/2009    GERD (gastroesophageal reflux disease) 04/11/2009    Sensorineural hearing loss 04/11/2009    Erectile dysfunction of non-organic origin 04/11/2009    Hypothyroid 04/11/2009         Review of Systems   Constitutional: Negative.   HENT: Negative.     Eyes: Negative.    Cardiovascular: Negative.    Respiratory: Negative.     Endocrine: Negative.    Hematologic/Lymphatic: Negative.    Skin: Negative.    Musculoskeletal: Negative.    Gastrointestinal: Negative.    Genitourinary: Negative.    Neurological: Negative.    Psychiatric/Behavioral: Negative.     Allergic/Immunologic: Negative.        Physical Exam   Constitutional: He appears well-developed. No distress.   HENT:  Head: Normocephalic and atraumatic.   Mouth/Throat: Mucous membranes are moist.   Eyes: No scleral icterus.   Neck: No JVD present. Carotid bruit is not present.   Cardiovascular: Normal rate and regular rhythm.   Murmur heard.  Systolic murmur is present with a grade of 1/6.  Pulmonary/Chest: Effort normal and breath sounds normal. No respiratory distress. He has no wheezes.   Abdominal: Soft. Bowel sounds are normal. He exhibits no distension. There is no abdominal tenderness.   Musculoskeletal:      Right lower leg: No edema.      Left lower leg: No edema.   Neurological: He is alert and oriented to person, place, and time. Gait (Uses cane.) abnormal.   Skin: Skin is warm and dry. He is not diaphoretic. No pallor.   Psychiatric: His behavior is normal.       Cardiovascular Health Factors  Vitals BP Readings from Last 3 Encounters:   05/07/23 130/86   11/27/22 (!) 148/74   10/16/22 110/80     Wt Readings from Last 3 Encounters:   05/07/23 82.6 kg (182 lb)   11/27/22 86.1 kg (189 lb 12.8 oz)   10/16/22 83.5 kg (184 lb)     BMI Readings from Last 3 Encounters:   05/07/23 27.67 kg/m?   11/27/22 28.86 kg/m?   10/16/22 27.98 kg/m?      Smoking Social History     Tobacco Use   Smoking Status Never   Smokeless Tobacco Never      Lipid Profile Cholesterol   Date Value Ref Range Status   05/06/2023 144  Final     HDL   Date Value Ref Range Status   05/06/2023 63 (H) 40 - 60 Final     LDL   Date Value Ref Range Status   05/06/2023 71  Final     Triglycerides   Date Value Ref Range Status   05/06/2023 51  Final      Blood Sugar Hemoglobin A1C   Date Value Ref Range Status   10/15/2022 5.6  Final     Glucose   Date Value Ref Range Status   05/06/2023 105  Final   10/15/2022 104  Final   08/19/2022 108 65 - 110 mg/dL Final   45/40/9811 94 70 - 110 MG/DL Final   91/47/8295 621 (H) 70 - 110 MG/DL Final   30/86/5784 96 70 - 110 MG/DL Final     Glucose Fasting   Date Value Ref Range Status   10/30/2021 100 70 - 100 MG/DL Final     Glucose, POC   Date Value Ref Range Status   05/18/2010 89 70 - 100 MG/DL Final   69/62/9528 413 (H) 70 - 100 MG/DL Final   24/40/1027 253 (H) 70 - 100 MG/DL Final          Problems Addressed Today  Encounter Diagnoses Name Primary?    Screening for heart disease Yes    Pure hypercholesterolemia     S/P left atrial appendage ligation     Chronic heart failure with preserved ejection fraction (HCC)     Balance problem     Paroxysmal ventricular tachycardia (HCC)     AV block, 2nd degree     Cardiac pacemaker in situ                      Current Medications (including today's revisions)   aspirin EC 81 mg tablet Take 1 tablet by mouth daily.  Take with food.    atorvastatin (LIPITOR) 20 mg tablet Take two tablets by mouth at bedtime daily.    CHOLEcalciferoL (vitamin D3) 1,000 units tablet Take one tablet by mouth daily.    cyanocobalamin (VITAMIN B-12) 1,000 mcg tablet Take one tablet by mouth daily. Start this the day after taking your one time Vitamin B12 injection.    ezetimibe (ZETIA) 10 mg tablet Take one tablet by mouth daily.    levothyroxine (SYNTHROID) 50 mcg tablet TAKE 1 TABLET BY MOUTH ONCE DAILY    lisinopriL (ZESTRIL) 30 mg tablet Take one tablet by mouth daily.    OMEGA-3 FATTY ACIDS/FISH OIL (FISH OIL-OMEGA-3 FATTY ACIDS PO) Take 4 tablets by mouth daily.    sotaloL (BETAPACE) 120 mg tablet Take one tablet by mouth twice daily.

## 2023-05-07 NOTE — Patient Instructions
It was nice to see you in clinic today.    We discussed:  Your cholesterol levels look much better on the current statin and zetia combination. Your blood pressure also looks good.   Your pacemaker is working well and you are having less extra beats than before while taking sotalol.   We will follow up your pacemaker and ultrasound test in November to ensure everything looks good. If there are any changes in symptoms or heart function, we might need to repeat a stress test and adjust your sotalol medication at that time, but so far everything looks like its doing well.     I would like to make the following medication adjustments:  No Rx changes.     Otherwise continue the same medications as you have been doing.    We will be pursuing the following tests after your appointment today:  Echo/Doppler (ultrasound of your heart looking at the structure, function, and valves of your heart) in November.     I will plan to see you back in about 12 months.  Please call us in the meantime with any questions or concerns.    If there are any questions or concerns, our main line is [phone]. Our scheduling department can be reached at [phone]. To discuss with our nursing team, the Cardiovascular Medicine [place] Team number is [phone].    If you have not heard or been advised of MyChart results of your testing in more than 1 week after it has been performed, please give [place] a call so that we may investigate further.    Lifestyle Modification recommendations:  Please continue to take your prescribed medications at the scheduled times on a regular basis.    Regular aerobic and weight bearing exercise is recommended at least 5 days a week for at least 30 minutes with a goal to exercise for 60 minutes. Ideally, exercise should be done daily if possible.     Eat a heart healthy, mediterranean style diet, focusing on fresh fruit, vegetables, nuts, legumes and avoidance of red or processed meat.   Limit the salt in your diet to less than 2,400 mg daily and calories in your diet.   Avoid high saturated fat/high cholesterol foods.  Increase soluble fiber intake as tolerated.     Limit alcohol intake to 1 drink daily for women and 2 drinks daily for men (1 drink = 5 ounces of wine, 12 ounces of beer, or 1 ounce of liquor).    Try to maintain an ideal body weight.    Do not smoke and avoid environments where people are smoking.    The American Heart Association has a great website with lots of important patient information about heart disease and other medical conditions including: high blood pressure, cholesterol disorders, diabetes mellitus, and strokes.  I highly recommend you visit their site: www.heart.org    Follow up regularly with your primary care physician.

## 2023-05-20 ENCOUNTER — Encounter: Admit: 2023-05-20 | Discharge: 2023-05-20 | Payer: MEDICARE

## 2023-05-20 NOTE — Telephone Encounter
05/20/2023 4:09 PM  Spoke with Jonathan Humphrey. He was not getting his refills as hoped.     Called his pharmacy and they were unable to get the request thru somehow. Gave them a verbal order for the patients zetia refill.    Called the patient back to let him know it was taken care of .

## 2023-06-04 ENCOUNTER — Encounter: Admit: 2023-06-04 | Discharge: 2023-06-04 | Payer: MEDICARE

## 2023-06-08 ENCOUNTER — Encounter: Admit: 2023-06-08 | Discharge: 2023-06-08 | Payer: MEDICARE

## 2023-06-08 DIAGNOSIS — H903 Sensorineural hearing loss, bilateral: Secondary | ICD-10-CM

## 2023-06-08 NOTE — Progress Notes
The patient was seen in the student repair clinic due to their dog chewing on their hearing aids. The patient reported the left hearing aid still worked.    While inspecting the hearing aids the right hearing aid receiver was broken. Both slim tip acrylic earmolds were damaged. However both hearing aids did work once I replaced the right receiver. The patient earmolds are out of warranty so there will be an $80 charger per earmold. We will call the patient whent he earmolds come in.     06/08/23 MB

## 2023-06-08 NOTE — Progress Notes
I provided student supervision for this visit and have reviewed and approve the visit note.

## 2023-06-16 ENCOUNTER — Encounter: Admit: 2023-06-16 | Discharge: 2023-06-16 | Payer: MEDICARE

## 2023-06-22 ENCOUNTER — Encounter: Admit: 2023-06-22 | Discharge: 2023-06-22 | Payer: MEDICARE

## 2023-06-23 ENCOUNTER — Ambulatory Visit: Admit: 2023-06-23 | Discharge: 2023-06-23 | Payer: MEDICARE

## 2023-06-23 ENCOUNTER — Encounter: Admit: 2023-06-23 | Discharge: 2023-06-23 | Payer: MEDICARE

## 2023-06-23 DIAGNOSIS — H903 Sensorineural hearing loss, bilateral: Secondary | ICD-10-CM

## 2023-06-23 NOTE — Progress Notes
Mr. Jonathan Humphrey was fit by Dr. Orvan Falconer, AuD in [facility] with these aids:      Manufacturer: [employer]  Model: Audeo P50-312  Serial Numbers: Right: [id number]  Left: [id number]  Receiver Length: Right: 2  Left:  2   Receiver Strength: Medium Power  Repair Warranty: 09/08/2025  L&D Warranty: 09/08/2025 ($350 deductible to completely replace the device and reprogram)  Battery: 312 (brown)  Coupling: SlimTip Micromold  Programs: 1. Everyday  Volume: Rocker switch: up = volume up, down = volume down  Prescriptive Targets: NAL-NL2  Fit Date: 06/19/2022     He had contacted me 06/04/23 over MyChart reporting he had lost these aids and requested that I replace them.    Replacement aids and molds were ordered.   He returned today to pick them up.    Upon arriving today, he was wearing these aids above, and I did see a recent note from [facility] that they were ordering replacement molds.    Today, I did not fit the aids or molds I ordered.  They will be returned to [facility].  I did troubleshoot his left aid as he reported high battery drain (1 per day).    His left aid ([id number]) was found to be intermittent.    I will take this aid to [facility] for them to send for repair.  He would be contacted by them when both of his molds arrive as well as this left aid.

## 2023-06-25 ENCOUNTER — Encounter: Admit: 2023-06-25 | Discharge: 2023-06-25 | Payer: MEDICARE

## 2023-07-12 ENCOUNTER — Encounter: Admit: 2023-07-12 | Discharge: 2023-07-12 | Payer: MEDICARE

## 2023-07-12 NOTE — Progress Notes
Received notification on [place] the remotes have been transferred.    no longer has Lexicographer [id number]  Serial Number [id number]        CDJ

## 2023-07-13 ENCOUNTER — Encounter: Admit: 2023-07-13 | Discharge: 2023-07-13 | Payer: MEDICARE

## 2023-07-13 ENCOUNTER — Ambulatory Visit: Admit: 2023-07-13 | Discharge: 2023-07-13 | Payer: MEDICARE

## 2023-07-13 DIAGNOSIS — H903 Sensorineural hearing loss, bilateral: Secondary | ICD-10-CM

## 2023-07-13 NOTE — Progress Notes
I provided student supervision for this visit and have reviewed and approve the visit note.      Shelby Anderle D. Satcha Storlie, Au.D., CCC-A   Audiologist

## 2023-07-13 NOTE — Progress Notes
Jonathan Humphrey was seen in the student repair clinic to pick up his left device and earmolds from repair. The patient reported his right hearing aid has been having feedback.    The patients devices were programmed with his old settings and his molds were put back on his devices. A feedback test was ran to stop the patient from hearing squealing. The patient remote control was repaired with his devices. The patient reported not hearing squealing anymore and was satisfied with the fit of his new earmolds. I advised the patient to schedule an audiogram since he was last test in June 2023 and he was stating his right ear is hearing worse. The patient stated he found it easier to see the audiologist in the pavilion so he might schedule an appointment there.     07/13/23 MB

## 2023-08-25 ENCOUNTER — Encounter: Admit: 2023-08-25 | Discharge: 2023-08-25 | Payer: MEDICARE

## 2023-10-06 ENCOUNTER — Encounter: Admit: 2023-10-06 | Discharge: 2023-10-06 | Payer: MEDICARE

## 2023-10-06 NOTE — Telephone Encounter
Pt LVM stating that he has decided to transition his PCP to [facility] which is more convenient for him.  Pt stated that he has been very satisfied with his care.    Spoke to pt to thank him for the information & to wish him well.  Informed him that I would pass along his compliment to Dr Al-Hihi.    Removed from pt panel.

## 2023-10-15 ENCOUNTER — Encounter: Admit: 2023-10-15 | Discharge: 2023-10-15 | Payer: MEDICARE

## 2023-10-15 DIAGNOSIS — Z950 Presence of cardiac pacemaker: Secondary | ICD-10-CM

## 2023-10-23 ENCOUNTER — Encounter: Admit: 2023-10-23 | Discharge: 2023-10-23 | Payer: MEDICARE

## 2023-12-06 ENCOUNTER — Encounter: Admit: 2023-12-06 | Discharge: 2023-12-06 | Payer: MEDICARE

## 2024-01-27 ENCOUNTER — Encounter: Admit: 2024-01-27 | Discharge: 2024-01-27 | Payer: MEDICARE

## 2024-02-18 ENCOUNTER — Encounter: Admit: 2024-02-18 | Discharge: 2024-02-18 | Payer: MEDICARE

## 2024-03-17 ENCOUNTER — Encounter: Admit: 2024-03-17 | Discharge: 2024-03-17

## 2024-10-09 ENCOUNTER — Encounter: Admit: 2024-10-09 | Discharge: 2024-10-09 | Payer: MEDICARE

## 2024-10-19 ENCOUNTER — Encounter: Admit: 2024-10-19 | Discharge: 2024-10-19 | Payer: MEDICARE

## 2024-10-19 NOTE — Telephone Encounter [36]
 Patient called today requesting Dr. Arta again for opinion on getting new Hearing aids through [facility]. He seen Dr. Arta 06/23/23... He wants to stick with Dr. Arta for the new Hearing aid.

## 2024-10-19 NOTE — Telephone Encounter [36]
 Returned Jonathan Humphrey phone call.  It was explained to Jonathan Humphrey that although he has a hearing aid benefit through his Medicare Advantage plan, the [facility] does not contract with the third party vendors utilized by those insurance plans.  Should he return to our clinic, he would forgo his benefit and the hearing aids would be a private pay purchase.  At this time, he will look into other options as to where he can utilize his benefit.  I recommended [facility] and [facility] [place]  [facility] as private practices in town which contract with some of the third party vendors.  Should he come to [place] for his hearing aids, he would like to come back to the ENT rather than [facility].

## 2024-10-25 ENCOUNTER — Encounter: Admit: 2024-10-25 | Discharge: 2024-10-25 | Payer: MEDICARE

## 2024-11-02 ENCOUNTER — Encounter: Admit: 2024-11-02 | Discharge: 2024-11-02 | Payer: MEDICARE
